# Patient Record
Sex: Female | Born: 1960 | Race: White | Hispanic: No | Marital: Married | Smoking: Former smoker
Health system: Southern US, Community
[De-identification: ages and names within clinical notes are randomized; demographics above are authoritative.]

## PROBLEM LIST (undated history)

## (undated) DIAGNOSIS — D649 Anemia, unspecified: Secondary | ICD-10-CM

## (undated) DIAGNOSIS — Z8719 Personal history of other diseases of the digestive system: Secondary | ICD-10-CM

## (undated) DIAGNOSIS — K56609 Unspecified intestinal obstruction, unspecified as to partial versus complete obstruction: Secondary | ICD-10-CM

## (undated) DIAGNOSIS — C801 Malignant (primary) neoplasm, unspecified: Secondary | ICD-10-CM

## (undated) DIAGNOSIS — K219 Gastro-esophageal reflux disease without esophagitis: Secondary | ICD-10-CM

## (undated) DIAGNOSIS — D219 Benign neoplasm of connective and other soft tissue, unspecified: Secondary | ICD-10-CM

## (undated) DIAGNOSIS — G2581 Restless legs syndrome: Secondary | ICD-10-CM

## (undated) DIAGNOSIS — E785 Hyperlipidemia, unspecified: Secondary | ICD-10-CM

## (undated) HISTORY — PX: EYE SURGERY: SHX253

## (undated) HISTORY — DX: Hyperlipidemia, unspecified: E78.5

## (undated) HISTORY — DX: Gastro-esophageal reflux disease without esophagitis: K21.9

## (undated) HISTORY — PX: SMALL INTESTINE SURGERY: SHX150

## (undated) HISTORY — PX: EXPLORATORY LAPAROTOMY: SUR591

## (undated) HISTORY — DX: Benign neoplasm of connective and other soft tissue, unspecified: D21.9

## (undated) HISTORY — PX: ABDOMINAL HYSTERECTOMY: SHX81

## (undated) HISTORY — PX: DIAGNOSTIC LAPAROSCOPY: SUR761

## (undated) HISTORY — DX: Personal history of other diseases of the digestive system: Z87.19

---

## 1985-09-12 DIAGNOSIS — N809 Endometriosis, unspecified: Secondary | ICD-10-CM | POA: Insufficient documentation

## 2014-06-27 LAB — LIPID PANEL
HDL: 84 — AB (ref 35–70)
LDL CALC: 127
Triglycerides: 40 (ref 40–160)

## 2014-08-28 LAB — HM COLONOSCOPY

## 2014-08-28 LAB — COLOGUARD: COLOGUARD: NEGATIVE

## 2015-09-13 HISTORY — PX: ROTATOR CUFF REPAIR: SHX139

## 2016-06-09 LAB — HM COLONOSCOPY

## 2016-06-13 LAB — HM MAMMOGRAPHY

## 2016-06-15 LAB — FERRITIN: Ferritin: 17.5

## 2017-01-20 DIAGNOSIS — Z23 Encounter for immunization: Secondary | ICD-10-CM | POA: Diagnosis not present

## 2017-03-01 ENCOUNTER — Encounter: Payer: Self-pay | Admitting: Family Medicine

## 2017-03-01 ENCOUNTER — Ambulatory Visit (INDEPENDENT_AMBULATORY_CARE_PROVIDER_SITE_OTHER): Payer: 59 | Admitting: Family Medicine

## 2017-03-01 VITALS — BP 118/66 | HR 81 | Temp 98.5°F | Wt 131.0 lb

## 2017-03-01 DIAGNOSIS — Z7689 Persons encountering health services in other specified circumstances: Secondary | ICD-10-CM

## 2017-03-01 DIAGNOSIS — H9193 Unspecified hearing loss, bilateral: Secondary | ICD-10-CM | POA: Diagnosis not present

## 2017-03-01 DIAGNOSIS — J069 Acute upper respiratory infection, unspecified: Secondary | ICD-10-CM | POA: Diagnosis not present

## 2017-03-01 NOTE — Progress Notes (Signed)
Subjective:    Patient ID: Jennifer [name], female    DOB: [birth year], 56 y.o.   MRN: [id number]  HPI This is a 56 yo female, employed by [employer] who presents today to establish care and with sinus problems x 1-2 weeks. Started as cold, now having pressure under eyes. Some post nasal drainage and cough. Currently with thin nasal secretions. Some cough, relieved with mucinex dm. Symptoms a little improved today. Has noticed it is more difficult to hear when she is taking blood pressure readings. This is new with current symptoms.   Had total hysterectomy at young age due to fibroids. Uses estriol/testosterone drops to wrist daily. Takes progesterone 100 mg daily for restless legs.   Moved here from [place], [place]. Married with three adopted children. Enjoys exercise, travel.   Past Medical History:  Diagnosis Date  . GERD (gastroesophageal reflux disease)   . Hyperlipidemia    Past Surgical History:  Procedure Laterality Date  . ABDOMINAL HYSTERECTOMY    . ROTATOR CUFF REPAIR Right 2017   Family History  Problem Relation Age of Onset  . Cancer Mother   . Early death Mother   . Diabetes Father   . Hyperlipidemia Father   . Heart disease Father   . Cancer Maternal Grandmother   . Cancer Paternal Grandmother    Social History  Substance Use Topics  . Smoking status: Never Smoker  . Smokeless tobacco: Never Used  . Alcohol use 1.2 oz/week    2 Glasses of wine per week      Review of Systems  Constitutional: Negative for fatigue, fever and unexpected weight change.  HENT: Positive for postnasal drip, rhinorrhea and sinus pressure. Negative for sore throat.   Respiratory: Positive for cough. Negative for shortness of breath and wheezing.   Cardiovascular: Negative for chest pain, palpitations and leg swelling.  Neurological: Negative for headaches.       Objective:   Physical Exam  Constitutional: She is oriented to person, place, and time. She appears well-developed and  well-nourished. No distress.  HENT:  Head: Normocephalic and atraumatic.  Right Ear: External ear normal.  Left Ear: External ear normal.  Nose: Mucosal edema, rhinorrhea and septal deviation present. Right sinus exhibits no maxillary sinus tenderness and no frontal sinus tenderness. Left sinus exhibits no maxillary sinus tenderness and no frontal sinus tenderness.  Mouth/Throat: Uvula is midline. Posterior oropharyngeal erythema (mild with post nasal drainage.) present.  Hearing grossly normal. Weber- no lateralization, Rhine- AC>BC. TMs dull.   Cardiovascular: Normal rate, regular rhythm and normal heart sounds.   Pulmonary/Chest: Effort normal and breath sounds normal.  Neurological: She is alert and oriented to person, place, and time.  Skin: Skin is warm and dry. She is not diaphoretic.  Psychiatric: She has a normal mood and affect. Her behavior is normal. Judgment and thought content normal.  Vitals reviewed.     BP 118/66 (BP Location: Right Arm, Patient Position: Sitting, Cuff Size: Normal)   Pulse 81   Temp 98.5 F (36.9 C) (Oral)   Wt 131 lb (59.4 kg)   SpO2 97%  Wt Readings from Last 3 Encounters:  03/01/17 131 lb (59.4 kg)      Assessment & Plan:  1. Encounter to establish care - will obtain records from prior PCP, she will send me her labs via Mychart  2. Viral URI - feeling better today, discussed symptomatic treatment and written and verbal instructions provided  3. Decreased hearing of both ears - gross  hearing intact - likely due to fluid related to #2 - will try Afrin and Sudafed - if no improvement or if worsening symptoms, will refer to ENT   Clarene Reamer, FNP-BC  [facility] [place], [place]  03/04/2017 7:08 AM

## 2017-03-01 NOTE — Patient Instructions (Addendum)
Sudafed 1 tablet am/after lunch  Afrin for up to 4 days  If hearing not better in 1 week let me know

## 2017-03-04 DIAGNOSIS — K219 Gastro-esophageal reflux disease without esophagitis: Secondary | ICD-10-CM | POA: Insufficient documentation

## 2017-03-06 ENCOUNTER — Encounter: Payer: Self-pay | Admitting: Family Medicine

## 2017-03-07 ENCOUNTER — Encounter: Payer: Self-pay | Admitting: Family Medicine

## 2017-03-08 ENCOUNTER — Encounter: Payer: Self-pay | Admitting: Family Medicine

## 2017-03-09 MED FILL — PROGESTERONE 100 MG CAPSULE: 100 | 90 days supply | Qty: 90 | Fill #0

## 2017-03-14 ENCOUNTER — Encounter: Payer: Self-pay | Admitting: Family Medicine

## 2017-03-16 ENCOUNTER — Encounter: Payer: Self-pay | Admitting: Family Medicine

## 2017-04-10 MED FILL — raNITIdine HCL 150 MG TABS: 150 | 90 days supply | Qty: 180 | Fill #0

## 2017-04-28 ENCOUNTER — Encounter: Payer: Self-pay | Admitting: Family Medicine

## 2017-05-05 ENCOUNTER — Other Ambulatory Visit: Payer: Self-pay | Admitting: Family Medicine

## 2017-05-05 DIAGNOSIS — Z1231 Encounter for screening mammogram for malignant neoplasm of breast: Secondary | ICD-10-CM

## 2017-05-14 ENCOUNTER — Encounter (HOSPITAL_COMMUNITY): Payer: Self-pay | Admitting: *Deleted

## 2017-05-14 ENCOUNTER — Emergency Department (HOSPITAL_COMMUNITY): Payer: 59

## 2017-05-14 ENCOUNTER — Inpatient Hospital Stay (HOSPITAL_COMMUNITY)
Admission: EM | Admit: 2017-05-14 | Discharge: 2017-05-17 | DRG: 390 | Disposition: A | Discharge status: Home / Self Care | Payer: 59 | Attending: General Surgery | Admitting: General Surgery

## 2017-05-14 DIAGNOSIS — Z885 Allergy status to narcotic agent status: Secondary | ICD-10-CM | POA: Diagnosis not present

## 2017-05-14 DIAGNOSIS — K565 Intestinal adhesions [bands], unspecified as to partial versus complete obstruction: Secondary | ICD-10-CM | POA: Diagnosis present

## 2017-05-14 DIAGNOSIS — K219 Gastro-esophageal reflux disease without esophagitis: Secondary | ICD-10-CM | POA: Diagnosis present

## 2017-05-14 DIAGNOSIS — K56609 Unspecified intestinal obstruction, unspecified as to partial versus complete obstruction: Secondary | ICD-10-CM | POA: Diagnosis present

## 2017-05-14 DIAGNOSIS — E860 Dehydration: Secondary | ICD-10-CM | POA: Diagnosis present

## 2017-05-14 DIAGNOSIS — N809 Endometriosis, unspecified: Secondary | ICD-10-CM | POA: Diagnosis present

## 2017-05-14 DIAGNOSIS — R112 Nausea with vomiting, unspecified: Secondary | ICD-10-CM | POA: Diagnosis not present

## 2017-05-14 DIAGNOSIS — Z79890 Hormone replacement therapy (postmenopausal): Secondary | ICD-10-CM | POA: Diagnosis not present

## 2017-05-14 DIAGNOSIS — R1110 Vomiting, unspecified: Secondary | ICD-10-CM | POA: Diagnosis not present

## 2017-05-14 DIAGNOSIS — N281 Cyst of kidney, acquired: Secondary | ICD-10-CM | POA: Diagnosis not present

## 2017-05-14 DIAGNOSIS — Z4682 Encounter for fitting and adjustment of non-vascular catheter: Secondary | ICD-10-CM | POA: Diagnosis not present

## 2017-05-14 DIAGNOSIS — E876 Hypokalemia: Secondary | ICD-10-CM | POA: Diagnosis not present

## 2017-05-14 DIAGNOSIS — Z90710 Acquired absence of both cervix and uterus: Secondary | ICD-10-CM | POA: Diagnosis not present

## 2017-05-14 DIAGNOSIS — K56600 Partial intestinal obstruction, unspecified as to cause: Secondary | ICD-10-CM | POA: Diagnosis not present

## 2017-05-14 DIAGNOSIS — R109 Unspecified abdominal pain: Secondary | ICD-10-CM | POA: Diagnosis not present

## 2017-05-14 DIAGNOSIS — R739 Hyperglycemia, unspecified: Secondary | ICD-10-CM | POA: Diagnosis present

## 2017-05-14 DIAGNOSIS — Z79899 Other long term (current) drug therapy: Secondary | ICD-10-CM

## 2017-05-14 DIAGNOSIS — Z0189 Encounter for other specified special examinations: Secondary | ICD-10-CM

## 2017-05-14 HISTORY — DX: Unspecified intestinal obstruction, unspecified as to partial versus complete obstruction: K56.609

## 2017-05-14 LAB — CBC
HEMATOCRIT: 48.4 % — AB (ref 36.0–46.0)
Hemoglobin: 17.1 g/dL — ABNORMAL HIGH (ref 12.0–15.0)
MCH: 30.2 pg (ref 26.0–34.0)
MCHC: 35.3 g/dL (ref 30.0–36.0)
MCV: 85.4 fL (ref 78.0–100.0)
PLATELETS: 349 10*3/uL (ref 150–400)
RBC: 5.67 MIL/uL — AB (ref 3.87–5.11)
RDW: 13.2 % (ref 11.5–15.5)
WBC: 19.8 10*3/uL — ABNORMAL HIGH (ref 4.0–10.5)

## 2017-05-14 LAB — COMPREHENSIVE METABOLIC PANEL
ALT: 16 U/L (ref 14–54)
AST: 25 U/L (ref 15–41)
Albumin: 5.1 g/dL — ABNORMAL HIGH (ref 3.5–5.0)
Alkaline Phosphatase: 63 U/L (ref 38–126)
Anion gap: 19 — ABNORMAL HIGH (ref 5–15)
BUN: 22 mg/dL — ABNORMAL HIGH (ref 6–20)
CHLORIDE: 96 mmol/L — AB (ref 101–111)
CO2: 25 mmol/L (ref 22–32)
CREATININE: 1.16 mg/dL — AB (ref 0.44–1.00)
Calcium: 10.7 mg/dL — ABNORMAL HIGH (ref 8.9–10.3)
GFR calc non Af Amer: 53 mL/min — ABNORMAL LOW (ref >60)
Glucose, Bld: 160 mg/dL — ABNORMAL HIGH (ref 65–99)
Potassium: 3.3 mmol/L — ABNORMAL LOW (ref 3.5–5.1)
SODIUM: 140 mmol/L (ref 135–145)
Total Bilirubin: 1.1 mg/dL (ref 0.3–1.2)
Total Protein: 8.6 g/dL — ABNORMAL HIGH (ref 6.5–8.1)

## 2017-05-14 LAB — LIPASE, BLOOD: Lipase: 26 U/L (ref 11–51)

## 2017-05-14 MED ORDER — FENTANYL CITRATE (PF) 100 MCG/2ML IJ SOLN
25.0000 ug | INTRAMUSCULAR | Status: DC | PRN
  Administered 2017-05-14 – 2017-05-15 (×7): 50 ug via INTRAVENOUS
  Filled 2017-05-14 (×7): qty 2

## 2017-05-14 MED ORDER — HYDROMORPHONE HCL 1 MG/ML IJ SOLN
0.5000 mg | Freq: Once | INTRAMUSCULAR | Status: AC
  Administered 2017-05-14: 0.5 mg via INTRAVENOUS
  Filled 2017-05-14: qty 1

## 2017-05-14 MED ORDER — PHENOL 1.4 % MT LIQD
1.0000 | OROMUCOSAL | Status: DC | PRN
  Filled 2017-05-14: qty 177

## 2017-05-14 MED ORDER — IOPAMIDOL (ISOVUE-300) INJECTION 61%
100.0000 mL | Freq: Once | INTRAVENOUS | Status: AC | PRN
  Administered 2017-05-14: 100 mL via INTRAVENOUS

## 2017-05-14 MED ORDER — ONDANSETRON HCL 4 MG/2ML IJ SOLN
4.0000 mg | Freq: Once | INTRAMUSCULAR | Status: AC
  Administered 2017-05-14: 4 mg via INTRAVENOUS
  Filled 2017-05-14: qty 2

## 2017-05-14 MED ORDER — SODIUM CHLORIDE 0.9 % IV BOLUS (SEPSIS)
1000.0000 mL | Freq: Once | INTRAVENOUS | Status: AC
  Administered 2017-05-14: 1000 mL via INTRAVENOUS

## 2017-05-14 MED ORDER — POTASSIUM CHLORIDE 10 MEQ/100ML IV SOLN
10.0000 meq | Freq: Once | INTRAVENOUS | Status: AC
  Administered 2017-05-14: 10 meq via INTRAVENOUS
  Filled 2017-05-14: qty 100

## 2017-05-14 MED ORDER — KCL IN DEXTROSE-NACL 20-5-0.9 MEQ/L-%-% IV SOLN
INTRAVENOUS | Status: DC
  Administered 2017-05-14 – 2017-05-15 (×3): via INTRAVENOUS
  Filled 2017-05-14 (×4): qty 1000

## 2017-05-14 MED ORDER — ONDANSETRON 4 MG PO TBDP: 4.0000 mg | ORAL_TABLET | Freq: Four times a day (QID) | ORAL | Status: DC | PRN

## 2017-05-14 MED ORDER — ONDANSETRON HCL 4 MG/2ML IJ SOLN
4.0000 mg | Freq: Four times a day (QID) | INTRAMUSCULAR | Status: DC | PRN
  Administered 2017-05-14 – 2017-05-15 (×2): 4 mg via INTRAVENOUS
  Filled 2017-05-14 (×2): qty 2

## 2017-05-14 MED ORDER — SODIUM CHLORIDE 0.9 % IV SOLN
INTRAVENOUS | Status: DC
  Administered 2017-05-14: 13:00:00 via INTRAVENOUS

## 2017-05-14 MED ORDER — PANTOPRAZOLE SODIUM 40 MG IV SOLR
40.0000 mg | Freq: Every day | INTRAVENOUS | Status: DC
  Administered 2017-05-14 – 2017-05-15 (×2): 40 mg via INTRAVENOUS
  Filled 2017-05-14 (×3): qty 40

## 2017-05-14 MED ORDER — IOPAMIDOL (ISOVUE-300) INJECTION 61%
INTRAVENOUS | Status: AC
  Filled 2017-05-14: qty 100

## 2017-05-14 MED ORDER — HYDROMORPHONE HCL 1 MG/ML IJ SOLN
1.0000 mg | Freq: Once | INTRAMUSCULAR | Status: AC
  Administered 2017-05-14: 1 mg via INTRAVENOUS
  Filled 2017-05-14: qty 1

## 2017-05-14 MED ORDER — HEPARIN SODIUM (PORCINE) 5000 UNIT/ML IJ SOLN
5000.0000 [IU] | Freq: Three times a day (TID) | INTRAMUSCULAR | Status: DC
  Administered 2017-05-14 – 2017-05-17 (×7): 5000 [IU] via SUBCUTANEOUS
  Filled 2017-05-14 (×6): qty 1

## 2017-05-14 NOTE — ED Notes (Signed)
Bed: WA25 Expected date:  Expected time:  Means of arrival:  Comments: 

## 2017-05-14 NOTE — ED Notes (Addendum)
Pt is alert and oriented x 4 and is verbally responsive. Pt is grimace in pain with 10/10 pain and is guarding,  in the  Epigastric region x 2 days with 5 episodes of vomiting I  The past 24 hours with green emesis. Pt spouse is at bedside.Marland Kitchen

## 2017-05-14 NOTE — H&P (Signed)
Jennifer [name] is an 56 y.o. female.   Chief Complaint: abdominal pain HPI:  The patient is a 56 year old white female who presents to the  Emergency department with a 1 day history of abdominal pain. The pain has been associated with nausea and vomiting. She has also felt bloated. She has a history of previous bowel obstruction that required exploration in about 4-5 years ago. Her previous surgeries have been done in [place]. She had a CT scan that showed a small bowel obstruction with a possible transition zone likely in the right lower quadrant.  Past Medical History:  Diagnosis Date  . Bowel obstruction ([place])   . GERD (gastroesophageal reflux disease)   . Hyperlipidemia     Past Surgical History:  Procedure Laterality Date  . ABDOMINAL HYSTERECTOMY    . ROTATOR CUFF REPAIR Right 2017    Family History  Problem Relation Age of Onset  . Cancer Mother   . Early death Mother   . Diabetes Father   . Hyperlipidemia Father   . Heart disease Father   . Cancer Maternal Grandmother   . Cancer Paternal Grandmother    Social History:  reports that she has never smoked. She has never used smokeless tobacco. She reports that she drinks about 1.2 oz of alcohol per week . Her drug history is not on file.  Allergies:  Allergies  Allergen Reactions  . Codeine Nausea And Vomiting     (Not in a hospital admission)  Results for orders placed or performed during the hospital encounter of 05/14/17 (from the past 48 hour(s))  Lipase, blood     Status: None   Collection Time: 05/14/17 10:42 AM  Result Value Ref Range   Lipase 26 11 - 51 U/L  Comprehensive metabolic panel     Status: Abnormal   Collection Time: 05/14/17 10:42 AM  Result Value Ref Range   Sodium 140 135 - 145 mmol/L   Potassium 3.3 (L) 3.5 - 5.1 mmol/L   Chloride 96 (L) 101 - 111 mmol/L   CO2 25 22 - 32 mmol/L   Glucose, Bld 160 (H) 65 - 99 mg/dL   BUN 22 (H) 6 - 20 mg/dL   Creatinine, Ser 1.16 (H) 0.44 - 1.00 mg/dL   Calcium 10.7 (H) 8.9 - 10.3 mg/dL   Total Protein 8.6 (H) 6.5 - 8.1 g/dL   Albumin 5.1 (H) 3.5 - 5.0 g/dL   AST 25 15 - 41 U/L   ALT 16 14 - 54 U/L   Alkaline Phosphatase 63 38 - 126 U/L   Total Bilirubin 1.1 0.3 - 1.2 mg/dL   GFR calc non Af Amer 53 (L) >60 mL/min   GFR calc Af Amer >60 >60 mL/min    Comment: (NOTE) The eGFR has been calculated using the CKD EPI equation. This calculation has not been validated in all clinical situations. eGFR's persistently <60 mL/min signify possible Chronic Kidney Disease.    Anion gap 19 (H) 5 - 15  CBC     Status: Abnormal   Collection Time: 05/14/17 10:42 AM  Result Value Ref Range   WBC 19.8 (H) 4.0 - 10.5 K/uL   RBC 5.67 (H) 3.87 - 5.11 MIL/uL   Hemoglobin 17.1 (H) 12.0 - 15.0 g/dL   HCT 48.4 (H) 36.0 - 46.0 %   MCV 85.4 78.0 - 100.0 fL   MCH 30.2 26.0 - 34.0 pg   MCHC 35.3 30.0 - 36.0 g/dL   RDW 13.2 11.5 - 15.5 %  Platelets 349 150 - 400 K/uL   Ct Abdomen Pelvis W Contrast  Result Date: 05/14/2017 CLINICAL DATA:  Abdominal pain, nausea and vomiting. History of bowel obstruction. EXAM: CT ABDOMEN AND PELVIS WITH CONTRAST TECHNIQUE: Multidetector CT imaging of the abdomen and pelvis was performed using the standard protocol following bolus administration of intravenous contrast. CONTRAST:  126m ISOVUE-300 IOPAMIDOL (ISOVUE-300) INJECTION 61% COMPARISON:  None. FINDINGS: Lower chest: No significant pulmonary nodules or acute consolidative airspace disease. Hepatobiliary: Normal size liver. Subcentimeter hypodense peripheral right liver lobe lesion, too small to characterize, for which no further evaluation is required unless the patient has risk factors for liver malignancy. No additional liver lesions. Normal gallbladder with no radiopaque cholelithiasis. No biliary ductal dilatation. Common bile duct diameter 6 mm, top-normal. Pancreas: Normal, with no mass or duct dilation. Spleen: Normal size. No mass. Adrenals/Urinary Tract: Normal  adrenals. Hypodense 1.1 cm interpolar right renal cortical lesion. Otherwise normal kidneys, with no additional renal lesions and no hydronephrosis. Normal bladder. Stomach/Bowel: Mildly distended fluid-filled stomach. There is diffuse mild dilation of proximal and mid small bowel loops up to 3.7 cm diameter with associated air-fluid levels throughout the dilated small bowel loops right the distal small bowel is collapsed. Focal small bowel caliber transition is identified in the right lower quadrant (series 2/ image 62). No small bowel wall thickening, pneumatosis or discrete mass. Normal appendix. Collapsed normal large bowel with minimal stool. No large bowel wall thickening. Vascular/Lymphatic: Mildly atherosclerotic nonaneurysmal abdominal aorta. Patent portal, splenic, hepatic and renal veins. No pathologically enlarged lymph nodes in the abdomen or pelvis. Reproductive: Status post hysterectomy, with no abnormal findings at the vaginal cuff. No adnexal mass. Other: No pneumoperitoneum. No focal fluid collections. Small volume perihepatic and pelvic ascites. Musculoskeletal: No aggressive appearing focal osseous lesions. Mild thoracolumbar spondylosis. IMPRESSION: 1. Mid to distal mechanical small bowel obstruction with transition point identified in the right lower quadrant, probably due to adhesions, appearing moderate to high-grade. No bowel wall thickening. No perforation. 2. Small volume perihepatic and pelvic ascites. No focal fluid collections. 3. Indeterminate hypodense 1.1 cm interpolar right renal cortical lesion. Outpatient evaluation with MRI abdomen without with IV contrast recommended to exclude renal neoplasm. This recommendation follows ACR consensus guidelines: Management of the Incidental Renal Mass on CT: A White Paper of the ACR Incidental Findings Committee. J Am Coll Radiol 2018;15:264-273. 4.  Aortic Atherosclerosis (ICD10-I70.0). Electronically Signed   By: JIlona SorrelM.D.   On:  05/14/2017 13:26    Review of Systems  Constitutional: Negative.   HENT: Negative.   Eyes: Negative.   Respiratory: Negative.   Cardiovascular: Negative.   Gastrointestinal: Positive for abdominal pain, nausea and vomiting.  Genitourinary: Negative.   Musculoskeletal: Negative.   Skin: Negative.   Neurological: Negative.   Endo/Heme/Allergies: Negative.   Psychiatric/Behavioral: Negative.     Blood pressure 121/68, pulse 76, temperature 98.3 F (36.8 C), temperature source Oral, resp. rate 18, height 5' 4"  (1.626 m), weight 59 kg (130 lb), SpO2 97 %. Physical Exam  Constitutional: She is oriented to person, place, and time. She appears well-developed and well-nourished. No distress.  HENT:  Head: Normocephalic and atraumatic.  Mouth/Throat: No oropharyngeal exudate.  Eyes: Pupils are equal, round, and reactive to light. Conjunctivae and EOM are normal.  Neck: Normal range of motion. Neck supple.  Cardiovascular: Normal rate, regular rhythm and normal heart sounds.   Respiratory: Effort normal and breath sounds normal. No stridor. No respiratory distress.  GI: Soft. Bowel sounds are  normal.  There is some mild tenderness with mild distension. No peritonitis  Musculoskeletal: Normal range of motion. She exhibits no edema or tenderness.  Neurological: She is alert and oriented to person, place, and time. Coordination normal.  Skin: Skin is warm and dry. No erythema.  Psychiatric: She has a normal mood and affect. Her behavior is normal. Thought content normal.     Assessment/Plan The patient appears to have a small bowel obstruction that is most likely secondary to adhesions. At this point I would recommend admission to the hospital. We will plan to place an NG tube and rest her bowel. Once she has been decompressed we will start the small bowel protocol tomorrow. If she does not improve over the next few days then she may require exploration. She is in agreement with this  treatment plan.  Merrie Roof, MD 05/14/2017, 3:21 PM

## 2017-05-14 NOTE — ED Notes (Signed)
Requested post shift change for report.

## 2017-05-14 NOTE — ED Triage Notes (Signed)
Patient is alert and oriented x4.  She is being seen for possible bower obstruction.  Patient has a Hx of Bowel Obstructions.  Her last obstruction was in 2008.  Patient starting having epigastric pain with nausea and vomiting.  Currently she rates her pain 10 of 10.

## 2017-05-14 NOTE — ED Notes (Signed)
Called report  To 5W. RN shift change requested 20 minutes prior to bringing pt,

## 2017-05-14 NOTE — ED Provider Notes (Addendum)
[facility] DEPT Provider Note   CSN: [id number] Arrival date & time: 05/14/17  1001     History   Chief Complaint Chief Complaint  Patient presents with  . Abdominal Pain    HPI Jennifer [name] is a 56 y.o. female.comPlains of abdominal pain at epigastric area which has since spread to her entire abdomen onset 3 days ago accompanied by multiple episodes of vomiting and feeling of distention in her abdomen. She feels that symptoms are similar to those she suffered in the past from bowel obstruction. She is no longer passing gas parietal. She denies any fever. No treatment prior to coming here. Nothing makes symptoms better or worse. Pain is constant and severe  HPI  Past Medical History:  Diagnosis Date  . Bowel obstruction ([place])   . GERD (gastroesophageal reflux disease)   . Hyperlipidemia   Endometriosis  Patient Active Problem List   Diagnosis Date Noted  . GERD (gastroesophageal reflux disease) 03/04/2017    Past Surgical History:  Procedure Laterality Date  . ABDOMINAL HYSTERECTOMY    . ROTATOR CUFF REPAIR Right 2017   Multiple abdominal surgeries OB History    No data available       Home Medications    Prior to Admission medications   Medication Sig Start Date End Date Taking? Authorizing Provider  Cholecalciferol (VITAMIN D-3) 1000 units CAPS    Yes [provider]  ferrous sulfate 325 (65 FE) MG tablet    Yes [provider]  ibuprofen (ADVIL,MOTRIN) 200 MG tablet Take 400 mg by mouth every 6 (six) hours as needed for mild pain.   Yes [provider]  NONFORMULARY OR COMPOUNDED ITEM Apply 5 drops topically daily.   Yes [provider]  progesterone (PROMETRIUM) 100 MG capsule  02/13/17  Yes [provider]  ranitidine (ZANTAC) 150 MG tablet Take 150 mg by mouth at bedtime.  02/11/17  Yes [provider]    Family History Family History  Problem Relation Age of Onset  . Cancer Mother   . Early death  Mother   . Diabetes Father   . Hyperlipidemia Father   . Heart disease Father   . Cancer Maternal Grandmother   . Cancer Paternal Grandmother     Social History Social History  Substance Use Topics  . Smoking status: Never Smoker  . Smokeless tobacco: Never Used  . Alcohol use 1.2 oz/week    2 Glasses of wine per week   No illicit drug  Allergies   Codeine   Review of Systems Review of Systems  Constitutional: Negative.   HENT: Negative.   Respiratory: Negative.   Cardiovascular: Negative.   Gastrointestinal: Positive for abdominal distention, abdominal pain, nausea and vomiting.  Musculoskeletal: Negative.   Skin: Negative.   Neurological: Negative.   Psychiatric/Behavioral: Negative.   All other systems reviewed and are negative.    Physical Exam Updated Vital Signs BP 109/60 (BP Location: Left Arm)   Pulse 77   Temp 98.3 F (36.8 C) (Oral)   Resp 18   Ht 5\' 4"  (1.626 m)   Wt 59 kg (130 lb)   SpO2 95%   BMI 22.31 kg/m   Physical Exam  Constitutional: She appears well-developed and well-nourished.  HENT:  Head: Normocephalic and atraumatic.  Eyes: Pupils are equal, round, and reactive to light. Conjunctivae are normal.  Neck: Neck supple. No tracheal deviation present. No thyromegaly present.  Cardiovascular: Normal rate and regular rhythm.   No murmur heard. Pulmonary/Chest:  Effort normal and breath sounds normal.  Abdominal: Soft. She exhibits no distension. There is tenderness.  Diminished bowel sounds  Musculoskeletal: Normal range of motion. She exhibits no edema or tenderness.  Neurological: She is alert. Coordination normal.  Skin: Skin is warm and dry. No rash noted.  Psychiatric: She has a normal mood and affect.  Nursing note and vitals reviewed.  2:20 PM pain much improved after treatment with intravenous opioids and intravenous hydration. Nausea has resolved after treatment with intravenous Zofran. She is also received IV potassium  supplementation. I consulted Dr. Marlou Starks from general surgery who will come to evaluate patient  ED Treatments / Results  Labs (all labs ordered are listed, but only abnormal results are displayed) Labs Reviewed  COMPREHENSIVE METABOLIC PANEL - Abnormal; Notable for the following:       Result Value   Potassium 3.3 (*)    Chloride 96 (*)    Glucose, Bld 160 (*)    BUN 22 (*)    Creatinine, Ser 1.16 (*)    Calcium 10.7 (*)    Total Protein 8.6 (*)    Albumin 5.1 (*)    GFR calc non Af Amer 53 (*)    Anion gap 19 (*)    All other components within normal limits  CBC - Abnormal; Notable for the following:    WBC 19.8 (*)    RBC 5.67 (*)    Hemoglobin 17.1 (*)    HCT 48.4 (*)    All other components within normal limits  LIPASE, BLOOD    EKG  EKG Interpretation None       Radiology Ct Abdomen Pelvis W Contrast  Result Date: 05/14/2017 CLINICAL DATA:  Abdominal pain, nausea and vomiting. History of bowel obstruction. EXAM: CT ABDOMEN AND PELVIS WITH CONTRAST TECHNIQUE: Multidetector CT imaging of the abdomen and pelvis was performed using the standard protocol following bolus administration of intravenous contrast. CONTRAST:  126mL ISOVUE-300 IOPAMIDOL (ISOVUE-300) INJECTION 61% COMPARISON:  None. FINDINGS: Lower chest: No significant pulmonary nodules or acute consolidative airspace disease. Hepatobiliary: Normal size liver. Subcentimeter hypodense peripheral right liver lobe lesion, too small to characterize, for which no further evaluation is required unless the patient has risk factors for liver malignancy. No additional liver lesions. Normal gallbladder with no radiopaque cholelithiasis. No biliary ductal dilatation. Common bile duct diameter 6 mm, top-normal. Pancreas: Normal, with no mass or duct dilation. Spleen: Normal size. No mass. Adrenals/Urinary Tract: Normal adrenals. Hypodense 1.1 cm interpolar right renal cortical lesion. Otherwise normal kidneys, with no additional  renal lesions and no hydronephrosis. Normal bladder. Stomach/Bowel: Mildly distended fluid-filled stomach. There is diffuse mild dilation of proximal and mid small bowel loops up to 3.7 cm diameter with associated air-fluid levels throughout the dilated small bowel loops right the distal small bowel is collapsed. Focal small bowel caliber transition is identified in the right lower quadrant (series 2/ image 62). No small bowel wall thickening, pneumatosis or discrete mass. Normal appendix. Collapsed normal large bowel with minimal stool. No large bowel wall thickening. Vascular/Lymphatic: Mildly atherosclerotic nonaneurysmal abdominal aorta. Patent portal, splenic, hepatic and renal veins. No pathologically enlarged lymph nodes in the abdomen or pelvis. Reproductive: Status post hysterectomy, with no abnormal findings at the vaginal cuff. No adnexal mass. Other: No pneumoperitoneum. No focal fluid collections. Small volume perihepatic and pelvic ascites. Musculoskeletal: No aggressive appearing focal osseous lesions. Mild thoracolumbar spondylosis. IMPRESSION: 1. Mid to distal mechanical small bowel obstruction with transition point identified in the right lower quadrant,  probably due to adhesions, appearing moderate to high-grade. No bowel wall thickening. No perforation. 2. Small volume perihepatic and pelvic ascites. No focal fluid collections. 3. Indeterminate hypodense 1.1 cm interpolar right renal cortical lesion. Outpatient evaluation with MRI abdomen without with IV contrast recommended to exclude renal neoplasm. This recommendation follows ACR consensus guidelines: Management of the Incidental Renal Mass on CT: A White Paper of the ACR Incidental Findings Committee. J Am Coll Radiol 2018;15:264-273. 4.  Aortic Atherosclerosis (ICD10-I70.0). Electronically Signed   By: Ilona Sorrel M.D.   On: 05/14/2017 13:26    Procedures Procedures (including critical care time)  Medications Ordered in  ED Medications  0.9 %  sodium chloride infusion ( Intravenous New Bag/Given 05/14/17 1259)  iopamidol (ISOVUE-300) 61 % injection (not administered)  sodium chloride 0.9 % bolus 1,000 mL (1,000 mLs Intravenous New Bag/Given 05/14/17 1049)  sodium chloride 0.9 % bolus 1,000 mL (0 mLs Intravenous Stopped 05/14/17 1157)  potassium chloride 10 mEq in 100 mL IVPB (10 mEq Intravenous New Bag/Given 05/14/17 1156)  HYDROmorphone (DILAUDID) injection 1 mg (1 mg Intravenous Given 05/14/17 1157)  ondansetron (ZOFRAN) injection 4 mg (4 mg Intravenous Given 05/14/17 1157)  HYDROmorphone (DILAUDID) injection 0.5 mg (0.5 mg Intravenous Given 05/14/17 1234)  iopamidol (ISOVUE-300) 61 % injection 100 mL (100 mLs Intravenous Contrast Given 05/14/17 1240)     Initial Impression / Assessment and Plan / ED Course  I have reviewed the triage vital signs and the nursing notes.  Pertinent labs & imaging results that were available during my care of the patient were reviewed by me and considered in my medical decision making (see chart for details).       Final Clinical Impressions(s) / ED Diagnoses  Diagnosis #1 small bowel obstruction #2 hypokalemia #3 dehydration #4 hyperglycemia #5 renal cortical lesion Final diagnoses:  None    New Prescriptions New Prescriptions   No medications on file     Orlie Dakin, MD 05/14/17 1450 3:20 PM patient requesting additional pain medicine. Additional intravenous hydromorphone ordered by me   Orlie Dakin, MD 05/14/17 [phone]

## 2017-05-14 NOTE — ED Notes (Signed)
Placed NGT hook-up to intermittent suction. Green discharge is note

## 2017-05-15 ENCOUNTER — Inpatient Hospital Stay (HOSPITAL_COMMUNITY): Payer: 59

## 2017-05-15 LAB — BASIC METABOLIC PANEL
Anion gap: 6 (ref 5–15)
BUN: 18 mg/dL (ref 6–20)
CHLORIDE: 112 mmol/L — AB (ref 101–111)
CO2: 26 mmol/L (ref 22–32)
CREATININE: 0.79 mg/dL (ref 0.44–1.00)
Calcium: 7.9 mg/dL — ABNORMAL LOW (ref 8.9–10.3)
GFR calc Af Amer: >60 mL/min (ref >60)
GFR calc non Af Amer: >60 mL/min (ref >60)
GLUCOSE: 113 mg/dL — AB (ref 65–99)
Potassium: 3.7 mmol/L (ref 3.5–5.1)
Sodium: 144 mmol/L (ref 135–145)

## 2017-05-15 LAB — CBC
HCT: 35.9 % — ABNORMAL LOW (ref 36.0–46.0)
Hemoglobin: 12 g/dL (ref 12.0–15.0)
MCH: 29.1 pg (ref 26.0–34.0)
MCHC: 33.4 g/dL (ref 30.0–36.0)
MCV: 87.1 fL (ref 78.0–100.0)
PLATELETS: 251 10*3/uL (ref 150–400)
RBC: 4.12 MIL/uL (ref 3.87–5.11)
RDW: 13.7 % (ref 11.5–15.5)
WBC: 8.5 10*3/uL (ref 4.0–10.5)

## 2017-05-15 MED ORDER — KCL IN DEXTROSE-NACL 20-5-0.9 MEQ/L-%-% IV SOLN
INTRAVENOUS | Status: DC
  Administered 2017-05-15 – 2017-05-16 (×2): via INTRAVENOUS
  Filled 2017-05-15 (×2): qty 1000

## 2017-05-15 MED ORDER — SIMETHICONE 40 MG/0.6ML PO SUSP
40.0000 mg | Freq: Four times a day (QID) | ORAL | Status: DC | PRN
Start: 2017-05-15 — End: 2017-05-17
  Filled 2017-05-15: qty 0.6

## 2017-05-15 MED ORDER — ACETAMINOPHEN 500 MG PO TABS
1000.0000 mg | ORAL_TABLET | Freq: Four times a day (QID) | ORAL | Status: DC | PRN
  Administered 2017-05-15 – 2017-05-17 (×3): 1000 mg via ORAL
  Filled 2017-05-15 (×3): qty 2

## 2017-05-15 MED ORDER — METOCLOPRAMIDE HCL 5 MG PO TABS: 5.0000 mg | ORAL_TABLET | Freq: Four times a day (QID) | ORAL | Status: DC | PRN

## 2017-05-15 MED ORDER — BISACODYL 10 MG RE SUPP: 10.0000 mg | Freq: Two times a day (BID) | RECTAL | Status: DC | PRN

## 2017-05-15 MED ORDER — HYDROMORPHONE HCL-NACL 0.5-0.9 MG/ML-% IV SOSY: 0.5000 mg | PREFILLED_SYRINGE | INTRAVENOUS | Status: DC | PRN

## 2017-05-15 MED ORDER — VITAMINS A & D EX OINT
TOPICAL_OINTMENT | CUTANEOUS | Status: AC
  Filled 2017-05-15: qty 5

## 2017-05-15 MED ORDER — LIP MEDEX EX OINT
1.0000 "application " | TOPICAL_OINTMENT | Freq: Two times a day (BID) | CUTANEOUS | Status: DC
  Administered 2017-05-15: 1 via TOPICAL
  Filled 2017-05-15: qty 7

## 2017-05-15 MED ORDER — ALUM & MAG HYDROXIDE-SIMETH 200-200-20 MG/5ML PO SUSP: 30.0000 mL | Freq: Four times a day (QID) | ORAL | Status: DC | PRN

## 2017-05-15 MED ORDER — METHOCARBAMOL 1000 MG/10ML IJ SOLN
1000.0000 mg | Freq: Four times a day (QID) | INTRAMUSCULAR | Status: DC | PRN
  Filled 2017-05-15 (×2): qty 10

## 2017-05-15 MED ORDER — MAGIC MOUTHWASH
15.0000 mL | Freq: Four times a day (QID) | ORAL | Status: DC | PRN
  Filled 2017-05-15: qty 15

## 2017-05-15 MED ORDER — DIATRIZOATE MEGLUMINE & SODIUM 66-10 % PO SOLN
90.0000 mL | Freq: Once | ORAL | Status: AC
  Administered 2017-05-15: 90 mL via NASOGASTRIC
  Filled 2017-05-15: qty 90

## 2017-05-15 MED ORDER — MENTHOL 3 MG MT LOZG: 1.0000 | LOZENGE | OROMUCOSAL | Status: DC | PRN

## 2017-05-15 MED ORDER — DIPHENHYDRAMINE HCL 12.5 MG/5ML PO ELIX
12.5000 mg | ORAL_SOLUTION | Freq: Four times a day (QID) | ORAL | Status: DC | PRN
  Administered 2017-05-15: 12.5 mg via ORAL
  Filled 2017-05-15: qty 5

## 2017-05-15 MED ORDER — DIATRIZOATE MEGLUMINE & SODIUM 66-10 % PO SOLN: 90.0000 mL | Freq: Once | ORAL | Status: DC

## 2017-05-15 NOTE — Progress Notes (Signed)
Note: Portions of this report may have been transcribed using voice recognition software. Every effort was made to ensure accuracy; however, inadvertent computerized transcription errors may be present.   Any transcriptional errors that result from this process are unintentional.             Jennifer [name]  1961-01-23 [id number]  Patient Care Team: Elby Beck, FNP as PCP - General (Nurse Practitioner)  This patient is a 56 y.o.female   Patient c/o NGT hirting nares XRays with contrast in colon Pull NGT Start low vol clears  Patient Active Problem List   Diagnosis Date Noted  . SBO (small bowel obstruction) ([place]) 05/14/2017  . GERD (gastroesophageal reflux disease) 03/04/2017    Past Medical History:  Diagnosis Date  . Bowel obstruction ([place])   . GERD (gastroesophageal reflux disease)   . Hyperlipidemia     Past Surgical History:  Procedure Laterality Date  . ABDOMINAL HYSTERECTOMY    . ROTATOR CUFF REPAIR Right 2017    Social History   Social History  . Marital status: Married    Spouse name: N/A  . Number of children: N/A  . Years of education: N/A   Occupational History  . Not on file.   Social History Main Topics  . Smoking status: Never Smoker  . Smokeless tobacco: Never Used  . Alcohol use 1.2 oz/week    2 Glasses of wine per week  . Drug use: No  . Sexual activity: Yes    Partners: Male    Birth control/ protection: None   Other Topics Concern  . Not on file   Social History Narrative  . No narrative on file    Family History  Problem Relation Age of Onset  . Cancer Mother   . Early death Mother   . Diabetes Father   . Hyperlipidemia Father   . Heart disease Father   . Cancer Maternal Grandmother   . Cancer Paternal Grandmother     Current Facility-Administered Medications  Medication Dose Route Frequency Provider Last Rate Last Dose  . 0.9 %  sodium chloride infusion   Intravenous Continuous Orlie Dakin, MD 125 mL/hr  at 05/14/17 1259    . alum & mag hydroxide-simeth (MAALOX/MYLANTA) 200-200-20 MG/5ML suspension 30 mL  30 mL Oral Q6H PRN Michael Boston, MD      . bisacodyl (DULCOLAX) suppository 10 mg  10 mg Rectal Q12H PRN Michael Boston, MD      . dextrose 5 % and 0.9 % NaCl with KCl 20 mEq/L infusion   Intravenous Continuous Autumn Messing III, MD 100 mL/hr at 05/15/17 1817    . fentaNYL (SUBLIMAZE) injection 25-50 mcg  25-50 mcg Intravenous Q1H PRN Autumn Messing III, MD   50 mcg at 05/15/17 1943  . heparin injection 5,000 Units  5,000 Units Subcutaneous Q8H Autumn Messing III, MD   5,000 Units at 05/15/17 1603  . lip balm (CARMEX) ointment 1 application  1 application Topical BID Michael Boston, MD      . magic mouthwash  15 mL Oral QID PRN Michael Boston, MD      . menthol-cetylpyridinium (CEPACOL) lozenge 3 mg  1 lozenge Oral PRN Michael Boston, MD      . ondansetron (ZOFRAN-ODT) disintegrating tablet 4 mg  4 mg Oral Q6H PRN Autumn Messing III, MD       Or  . ondansetron [facility]) injection 4 mg  4 mg Intravenous Q6H PRN Jovita Kussmaul, MD   4 mg at  05/15/17 1110  . pantoprazole (PROTONIX) injection 40 mg  40 mg Intravenous QHS Autumn Messing III, MD   40 mg at 05/14/17 2115  . phenol (CHLORASEPTIC) mouth spray 1 spray  1 spray Mouth/Throat PRN Autumn Messing III, MD      . simethicone (MYLICON) 40 BW/4.6KZ suspension 40 mg  40 mg Oral QID PRN Michael Boston, MD         Allergies  Allergen Reactions  . Codeine Nausea And Vomiting    BP (!) 127/59 (BP Location: Left Arm)   Pulse 70   Temp 98.5 F (36.9 C) (Oral)   Resp 16   Ht 5\' 4"  (1.626 m)   Wt 59 kg (130 lb)   SpO2 100%   BMI 22.31 kg/m   Dg Abdomen 1 View  Result Date: 05/14/2017 CLINICAL DATA:  NG tube placed EXAM: ABDOMEN - 1 VIEW COMPARISON:  CT 05/14/2017 FINDINGS: NG tube with tip in the gastric body. Side port below the GE junction. Dilated loops of small bowel measuring 3.3 cm noted. Lung bases are clear. IMPRESSION: NG tube in good position. Electronically  Signed   By: Suzy Bouchard M.D.   On: 05/14/2017 17:17   Ct Abdomen Pelvis W Contrast  Result Date: 05/14/2017 CLINICAL DATA:  Abdominal pain, nausea and vomiting. History of bowel obstruction. EXAM: CT ABDOMEN AND PELVIS WITH CONTRAST TECHNIQUE: Multidetector CT imaging of the abdomen and pelvis was performed using the standard protocol following bolus administration of intravenous contrast. CONTRAST:  143mL ISOVUE-300 IOPAMIDOL (ISOVUE-300) INJECTION 61% COMPARISON:  None. FINDINGS: Lower chest: No significant pulmonary nodules or acute consolidative airspace disease. Hepatobiliary: Normal size liver. Subcentimeter hypodense peripheral right liver lobe lesion, too small to characterize, for which no further evaluation is required unless the patient has risk factors for liver malignancy. No additional liver lesions. Normal gallbladder with no radiopaque cholelithiasis. No biliary ductal dilatation. Common bile duct diameter 6 mm, top-normal. Pancreas: Normal, with no mass or duct dilation. Spleen: Normal size. No mass. Adrenals/Urinary Tract: Normal adrenals. Hypodense 1.1 cm interpolar right renal cortical lesion. Otherwise normal kidneys, with no additional renal lesions and no hydronephrosis. Normal bladder. Stomach/Bowel: Mildly distended fluid-filled stomach. There is diffuse mild dilation of proximal and mid small bowel loops up to 3.7 cm diameter with associated air-fluid levels throughout the dilated small bowel loops right the distal small bowel is collapsed. Focal small bowel caliber transition is identified in the right lower quadrant (series 2/ image 62). No small bowel wall thickening, pneumatosis or discrete mass. Normal appendix. Collapsed normal large bowel with minimal stool. No large bowel wall thickening. Vascular/Lymphatic: Mildly atherosclerotic nonaneurysmal abdominal aorta. Patent portal, splenic, hepatic and renal veins. No pathologically enlarged lymph nodes in the abdomen or pelvis.  Reproductive: Status post hysterectomy, with no abnormal findings at the vaginal cuff. No adnexal mass. Other: No pneumoperitoneum. No focal fluid collections. Small volume perihepatic and pelvic ascites. Musculoskeletal: No aggressive appearing focal osseous lesions. Mild thoracolumbar spondylosis. IMPRESSION: 1. Mid to distal mechanical small bowel obstruction with transition point identified in the right lower quadrant, probably due to adhesions, appearing moderate to high-grade. No bowel wall thickening. No perforation. 2. Small volume perihepatic and pelvic ascites. No focal fluid collections. 3. Indeterminate hypodense 1.1 cm interpolar right renal cortical lesion. Outpatient evaluation with MRI abdomen without with IV contrast recommended to exclude renal neoplasm. This recommendation follows ACR consensus guidelines: Management of the Incidental Renal Mass on CT: A White Paper of the ACR Incidental Findings Committee. J  Am Coll Radiol 2018;15:264-273. 4.  Aortic Atherosclerosis (ICD10-I70.0). Electronically Signed   By: Ilona Sorrel M.D.   On: 05/14/2017 13:26   Dg Abd Portable 1v-small Bowel Obstruction Protocol-initial, 8 Hr Delay  Result Date: 05/15/2017 CLINICAL DATA:  Small bowel obstruction, improved on radiographs earlier today. EXAM: PORTABLE ABDOMEN - 1 VIEW COMPARISON:  Earlier today. FINDINGS: The current image was obtained 8 hours after administration of Gastrografin through the patient's nasogastric tube. This demonstrates contrast throughout normal caliber colon. There is a single mildly dilated loop of small bowel in the left mid to lower abdomen. No retained contrast is seen in the small bowel. The nasogastric tube tip is in the proximal stomach and side hole in the region of the gastroesophageal junction. Unremarkable bones. IMPRESSION: 1. Normal passage of contrast through the small bowel and into the colon. 2. Single mildly dilated loop of small bowel in the left mid to lower abdomen.  This could be due to focal ileus or minimal partial obstruction. Electronically Signed   By: Claudie Revering M.D.   On: 05/15/2017 19:37   Dg Abd Portable 2v  Result Date: 05/15/2017 CLINICAL DATA:  Small bowel obstruction EXAM: PORTABLE ABDOMEN - 2 VIEW COMPARISON:  05/14/2017 FINDINGS: Nasogastric tube extends into the stomach. Reduced degree of small bowel dilatation. There continues to be a lesser degree of dilatation and stacking of small bowel loops. Air is present throughout the colon to the rectum. Left lateral decubitus view is negative for free air. IMPRESSION: Persistent mildly dilated small bowel, improved, with a lesser degree of dilatation compared to 05/14/2017. No free air. Electronically Signed   By: Andreas Newport M.D.   On: 05/15/2017 06:49

## 2017-05-15 NOTE — Progress Notes (Signed)
Subjective/Chief Complaint: She feels better. Has started passing flatus   Objective: Vital signs in last 24 hours: Temp:  [97.6 F (36.4 C)-98.8 F (37.1 C)] 98.7 F (37.1 C) (09/03 0537) Pulse Rate:  [75-104] 77 (09/03 0537) Resp:  [18-20] 18 (09/03 0537) BP: (109-137)/(58-83) 121/58 (09/03 0537) SpO2:  [95 %-100 %] 100 % (09/03 0537) Weight:  [59 kg (130 lb)] 59 kg (130 lb) (09/02 1011)    Intake/Output from previous day: 09/02 0701 - 09/03 0700 In: 3006.7 [I.V.:906.7; IV Piggyback:2100] Out: 570 [Emesis/NG output:270; Stool:300] Intake/Output this shift: No intake/output data recorded.  General appearance: alert and cooperative Resp: clear to auscultation bilaterally Cardio: regular rate and rhythm GI: soft, nontender. not distended  Lab Results:   Recent Labs  05/14/17 1042 05/15/17 0343  WBC 19.8* 8.5  HGB 17.1* 12.0  HCT 48.4* 35.9*  PLT 349 251   BMET  Recent Labs  05/14/17 1042 05/15/17 0343  NA 140 144  K 3.3* 3.7  CL 96* 112*  CO2 25 26  GLUCOSE 160* 113*  BUN 22* 18  CREATININE 1.16* 0.79  CALCIUM 10.7* 7.9*   PT/INR No results for input(s): LABPROT, INR in the last 72 hours. ABG No results for input(s): PHART, HCO3 in the last 72 hours.  Invalid input(s): PCO2, PO2  Studies/Results: Dg Abdomen 1 View  Result Date: 05/14/2017 CLINICAL DATA:  NG tube placed EXAM: ABDOMEN - 1 VIEW COMPARISON:  CT 05/14/2017 FINDINGS: NG tube with tip in the gastric body. Side port below the GE junction. Dilated loops of small bowel measuring 3.3 cm noted. Lung bases are clear. IMPRESSION: NG tube in good position. Electronically Signed   By: Suzy Bouchard M.D.   On: 05/14/2017 17:17   Ct Abdomen Pelvis W Contrast  Result Date: 05/14/2017 CLINICAL DATA:  Abdominal pain, nausea and vomiting. History of bowel obstruction. EXAM: CT ABDOMEN AND PELVIS WITH CONTRAST TECHNIQUE: Multidetector CT imaging of the abdomen and pelvis was performed using the  standard protocol following bolus administration of intravenous contrast. CONTRAST:  165mL ISOVUE-300 IOPAMIDOL (ISOVUE-300) INJECTION 61% COMPARISON:  None. FINDINGS: Lower chest: No significant pulmonary nodules or acute consolidative airspace disease. Hepatobiliary: Normal size liver. Subcentimeter hypodense peripheral right liver lobe lesion, too small to characterize, for which no further evaluation is required unless the patient has risk factors for liver malignancy. No additional liver lesions. Normal gallbladder with no radiopaque cholelithiasis. No biliary ductal dilatation. Common bile duct diameter 6 mm, top-normal. Pancreas: Normal, with no mass or duct dilation. Spleen: Normal size. No mass. Adrenals/Urinary Tract: Normal adrenals. Hypodense 1.1 cm interpolar right renal cortical lesion. Otherwise normal kidneys, with no additional renal lesions and no hydronephrosis. Normal bladder. Stomach/Bowel: Mildly distended fluid-filled stomach. There is diffuse mild dilation of proximal and mid small bowel loops up to 3.7 cm diameter with associated air-fluid levels throughout the dilated small bowel loops right the distal small bowel is collapsed. Focal small bowel caliber transition is identified in the right lower quadrant (series 2/ image 62). No small bowel wall thickening, pneumatosis or discrete mass. Normal appendix. Collapsed normal large bowel with minimal stool. No large bowel wall thickening. Vascular/Lymphatic: Mildly atherosclerotic nonaneurysmal abdominal aorta. Patent portal, splenic, hepatic and renal veins. No pathologically enlarged lymph nodes in the abdomen or pelvis. Reproductive: Status post hysterectomy, with no abnormal findings at the vaginal cuff. No adnexal mass. Other: No pneumoperitoneum. No focal fluid collections. Small volume perihepatic and pelvic ascites. Musculoskeletal: No aggressive appearing focal osseous lesions. Mild  thoracolumbar spondylosis. IMPRESSION: 1. Mid to  distal mechanical small bowel obstruction with transition point identified in the right lower quadrant, probably due to adhesions, appearing moderate to high-grade. No bowel wall thickening. No perforation. 2. Small volume perihepatic and pelvic ascites. No focal fluid collections. 3. Indeterminate hypodense 1.1 cm interpolar right renal cortical lesion. Outpatient evaluation with MRI abdomen without with IV contrast recommended to exclude renal neoplasm. This recommendation follows ACR consensus guidelines: Management of the Incidental Renal Mass on CT: A White Paper of the ACR Incidental Findings Committee. J Am Coll Radiol 2018;15:264-273. 4.  Aortic Atherosclerosis (ICD10-I70.0). Electronically Signed   By: Ilona Sorrel M.D.   On: 05/14/2017 13:26   Dg Abd Portable 2v  Result Date: 05/15/2017 CLINICAL DATA:  Small bowel obstruction EXAM: PORTABLE ABDOMEN - 2 VIEW COMPARISON:  05/14/2017 FINDINGS: Nasogastric tube extends into the stomach. Reduced degree of small bowel dilatation. There continues to be a lesser degree of dilatation and stacking of small bowel loops. Air is present throughout the colon to the rectum. Left lateral decubitus view is negative for free air. IMPRESSION: Persistent mildly dilated small bowel, improved, with a lesser degree of dilatation compared to 05/14/2017. No free air. Electronically Signed   By: Andreas Newport M.D.   On: 05/15/2017 06:49    Anti-infectives: Anti-infectives    None      Assessment/Plan: s/p * No surgery found * will start small bowel protocol this am.  If contrast passes through then consider clamping ng later today Ambulate sbo improving  LOS: 1 day    TOTH III,Raza Bayless S 05/15/2017

## 2017-05-16 ENCOUNTER — Inpatient Hospital Stay (HOSPITAL_COMMUNITY): Payer: 59

## 2017-05-16 LAB — HIV ANTIBODY (ROUTINE TESTING W REFLEX): HIV SCREEN 4TH GENERATION: NONREACTIVE

## 2017-05-16 MED ORDER — OXYCODONE-ACETAMINOPHEN 5-325 MG PO TABS: 1.0000 | ORAL_TABLET | ORAL | Status: DC | PRN

## 2017-05-16 MED ORDER — METHOCARBAMOL 500 MG PO TABS
500.0000 mg | ORAL_TABLET | Freq: Three times a day (TID) | ORAL | Status: DC | PRN
  Administered 2017-05-16: 500 mg via ORAL
  Filled 2017-05-16: qty 1

## 2017-05-16 MED ORDER — HYDROMORPHONE HCL-NACL 0.5-0.9 MG/ML-% IV SOSY: 0.5000 mg | PREFILLED_SYRINGE | INTRAVENOUS | Status: DC | PRN

## 2017-05-16 NOTE — Progress Notes (Signed)
Patient ID: Jennifer [name], female   DOB: 05-04-1961, 56 y.o.   MRN: [id number]  [facility] Surgery Progress Note     Subjective: CC- SBO Patient states that she is feeling great this morning. Abdomen a little sore but pain resolved. She is tolerating clear liquids. Passing flatus and having few loose BM's. Has ambulated several laps this AM.  Objective: Vital signs in last 24 hours: Temp:  [98.1 F (36.7 C)-98.5 F (36.9 C)] 98.4 F (36.9 C) (09/04 0624) Pulse Rate:  [64-71] 64 (09/04 0624) Resp:  [16-18] 18 (09/04 0624) BP: (119-131)/(59-70) 119/63 (09/04 0624) SpO2:  [98 %-100 %] 98 % (09/04 0624) Last BM Date: 05/16/17  Intake/Output from previous day: 09/03 0701 - 09/04 0700 In: 3010.8 [I.V.:3010.8] Out: 2300 [Urine:1850; Emesis/NG output:450] Intake/Output this shift: No intake/output data recorded.  PE: Gen:  Alert, NAD, pleasant HEENT: EOM's intact, pupils equal and round Card:  RRR, no M/G/R heard Pulm:  CTAB, no W/R/R, effort normal Abd: Soft, ND, mild lower abdominal tenderness, +BS, no HSM, no hernia, well healed midline incision Ext:  No erythema, edema, or tenderness BUE/BLE  Psych: A&Ox3  Skin: no rashes noted, warm and dry  Lab Results:   Recent Labs  05/14/17 1042 05/15/17 0343  WBC 19.8* 8.5  HGB 17.1* 12.0  HCT 48.4* 35.9*  PLT 349 251   BMET  Recent Labs  05/14/17 1042 05/15/17 0343  NA 140 144  K 3.3* 3.7  CL 96* 112*  CO2 25 26  GLUCOSE 160* 113*  BUN 22* 18  CREATININE 1.16* 0.79  CALCIUM 10.7* 7.9*   PT/INR No results for input(s): LABPROT, INR in the last 72 hours. CMP     Component Value Date/Time   NA 144 05/15/2017 0343   K 3.7 05/15/2017 0343   CL 112 (H) 05/15/2017 0343   CO2 26 05/15/2017 0343   GLUCOSE 113 (H) 05/15/2017 0343   BUN 18 05/15/2017 0343   CREATININE 0.79 05/15/2017 0343   CALCIUM 7.9 (L) 05/15/2017 0343   PROT 8.6 (H) 05/14/2017 1042   ALBUMIN 5.1 (H) 05/14/2017 1042   AST 25 05/14/2017  1042   ALT 16 05/14/2017 1042   ALKPHOS 63 05/14/2017 1042   BILITOT 1.1 05/14/2017 1042   GFRNONAA >60 05/15/2017 0343   GFRAA >60 05/15/2017 0343   Lipase     Component Value Date/Time   LIPASE 26 05/14/2017 1042       Studies/Results: Dg Abdomen 1 View  Result Date: 05/14/2017 CLINICAL DATA:  NG tube placed EXAM: ABDOMEN - 1 VIEW COMPARISON:  CT 05/14/2017 FINDINGS: NG tube with tip in the gastric body. Side port below the GE junction. Dilated loops of small bowel measuring 3.3 cm noted. Lung bases are clear. IMPRESSION: NG tube in good position. Electronically Signed   By: Suzy Bouchard M.D.   On: 05/14/2017 17:17   Ct Abdomen Pelvis W Contrast  Result Date: 05/14/2017 CLINICAL DATA:  Abdominal pain, nausea and vomiting. History of bowel obstruction. EXAM: CT ABDOMEN AND PELVIS WITH CONTRAST TECHNIQUE: Multidetector CT imaging of the abdomen and pelvis was performed using the standard protocol following bolus administration of intravenous contrast. CONTRAST:  133mL ISOVUE-300 IOPAMIDOL (ISOVUE-300) INJECTION 61% COMPARISON:  None. FINDINGS: Lower chest: No significant pulmonary nodules or acute consolidative airspace disease. Hepatobiliary: Normal size liver. Subcentimeter hypodense peripheral right liver lobe lesion, too small to characterize, for which no further evaluation is required unless the patient has risk factors for liver malignancy. No additional  liver lesions. Normal gallbladder with no radiopaque cholelithiasis. No biliary ductal dilatation. Common bile duct diameter 6 mm, top-normal. Pancreas: Normal, with no mass or duct dilation. Spleen: Normal size. No mass. Adrenals/Urinary Tract: Normal adrenals. Hypodense 1.1 cm interpolar right renal cortical lesion. Otherwise normal kidneys, with no additional renal lesions and no hydronephrosis. Normal bladder. Stomach/Bowel: Mildly distended fluid-filled stomach. There is diffuse mild dilation of proximal and mid small bowel loops  up to 3.7 cm diameter with associated air-fluid levels throughout the dilated small bowel loops right the distal small bowel is collapsed. Focal small bowel caliber transition is identified in the right lower quadrant (series 2/ image 62). No small bowel wall thickening, pneumatosis or discrete mass. Normal appendix. Collapsed normal large bowel with minimal stool. No large bowel wall thickening. Vascular/Lymphatic: Mildly atherosclerotic nonaneurysmal abdominal aorta. Patent portal, splenic, hepatic and renal veins. No pathologically enlarged lymph nodes in the abdomen or pelvis. Reproductive: Status post hysterectomy, with no abnormal findings at the vaginal cuff. No adnexal mass. Other: No pneumoperitoneum. No focal fluid collections. Small volume perihepatic and pelvic ascites. Musculoskeletal: No aggressive appearing focal osseous lesions. Mild thoracolumbar spondylosis. IMPRESSION: 1. Mid to distal mechanical small bowel obstruction with transition point identified in the right lower quadrant, probably due to adhesions, appearing moderate to high-grade. No bowel wall thickening. No perforation. 2. Small volume perihepatic and pelvic ascites. No focal fluid collections. 3. Indeterminate hypodense 1.1 cm interpolar right renal cortical lesion. Outpatient evaluation with MRI abdomen without with IV contrast recommended to exclude renal neoplasm. This recommendation follows ACR consensus guidelines: Management of the Incidental Renal Mass on CT: A White Paper of the ACR Incidental Findings Committee. J Am Coll Radiol 2018;15:264-273. 4.  Aortic Atherosclerosis (ICD10-I70.0). Electronically Signed   By: Ilona Sorrel M.D.   On: 05/14/2017 13:26   Dg Abd Portable 1v-small Bowel Obstruction Protocol-24 Hr Delay  Result Date: 05/16/2017 CLINICAL DATA:  24 hour image for small bowel protocol. Pt is feeling very well today without NG tube in. Abdomen is less distended and is not in pain as much. Pt still taking the  pain meds prescribed to her. EXAM: PORTABLE ABDOMEN - 1 VIEW COMPARISON:  05/15/2017 FINDINGS: There is oral contrast material in the colon which has decreased in amount compared with 05/15/2017. There has been interval removal of the nasogastric tube. There is no bowel dilatation to suggest obstruction. There is no evidence of pneumoperitoneum, portal venous gas or pneumatosis. There are no pathologic calcifications along the expected course of the ureters. The osseous structures are unremarkable. IMPRESSION: Oral contrast material in the colon which has decreased in amount compared with 05/15/2017. Interval removal of the nasogastric tube. No bowel dilatation to suggest obstruction. Electronically Signed   By: Kathreen Devoid   On: 05/16/2017 11:58   Dg Abd Portable 1v-small Bowel Obstruction Protocol-initial, 8 Hr Delay  Result Date: 05/15/2017 CLINICAL DATA:  Small bowel obstruction, improved on radiographs earlier today. EXAM: PORTABLE ABDOMEN - 1 VIEW COMPARISON:  Earlier today. FINDINGS: The current image was obtained 8 hours after administration of Gastrografin through the patient's nasogastric tube. This demonstrates contrast throughout normal caliber colon. There is a single mildly dilated loop of small bowel in the left mid to lower abdomen. No retained contrast is seen in the small bowel. The nasogastric tube tip is in the proximal stomach and side hole in the region of the gastroesophageal junction. Unremarkable bones. IMPRESSION: 1. Normal passage of contrast through the small bowel and into the colon.  2. Single mildly dilated loop of small bowel in the left mid to lower abdomen. This could be due to focal ileus or minimal partial obstruction. Electronically Signed   By: Claudie Revering M.D.   On: 05/15/2017 19:37   Dg Abd Portable 2v  Result Date: 05/15/2017 CLINICAL DATA:  Small bowel obstruction EXAM: PORTABLE ABDOMEN - 2 VIEW COMPARISON:  05/14/2017 FINDINGS: Nasogastric tube extends into the  stomach. Reduced degree of small bowel dilatation. There continues to be a lesser degree of dilatation and stacking of small bowel loops. Air is present throughout the colon to the rectum. Left lateral decubitus view is negative for free air. IMPRESSION: Persistent mildly dilated small bowel, improved, with a lesser degree of dilatation compared to 05/14/2017. No free air. Electronically Signed   By: Andreas Newport M.D.   On: 05/15/2017 06:49    Anti-infectives: Anti-infectives    None       Assessment/Plan HLD GERD Right renal cortical lesion - will need outpatient evaluation  SBO, likely due to adhesions - prior h/o multiple abdominal surgeries - CT scan 9/2 showed Mid to distal mechanical small bowel obstruction with transition point identified in the right lower quadrant, probably due to Adhesions - XR today shows contrast in the colon, no bowel dilation  ID - none FEN - IVF, full liquids VTE - SCDs, heparin Foley - none Follow up - TBD  Plan - advance to full liquids. Continue ambulating. If she continues to progress well we can advance to soft diet tomorrow and hopefully discharge in the afternoon.   LOS: 2 days    Wellington Hampshire , [facility] Surgery 05/16/2017, 12:14 PM Pager: [phone] Consults: [phone] Mon-Fri 7:00 am-4:30 pm Sat-Sun 7:00 am-11:30 am

## 2017-05-16 NOTE — Consult Note (Signed)
   [facility] CM Inpatient Consult   05/16/2017  ITZEL MCKIBBIN [birth year] [id number]    Came to visit [name] on behalf of [place] to Wellness program for [employer] employees/dependents with [facility] insurance.   Discussed Link to Wellness program. Mrs. Harshbarger pleasantly denies having any Link to Wellness needs.   Confirmed best contact number for post discharge call as [phone].   Provided Link to [employer], 24-hr nurse line magnet, and contact information.   Appreciative of visit.  Marthenia Rolling, MSN-Ed, RN,BSN [facility] Liaison [phone]

## 2017-05-17 MED ORDER — ACETAMINOPHEN 500 MG PO TABS: 1000.0000 mg | ORAL_TABLET | Freq: Three times a day (TID) | ORAL | 0 refills | Status: DC | PRN

## 2017-05-17 NOTE — Progress Notes (Signed)
[facility] Progress Note     Subjective: CC:  Denies abdominal pain at rest, some mild soreness with palpation. Tolerated SOFT diet for breakfast. +flatus. Having loose BMs. Eager to go home. Employed as a CMA at [employer].  Objective: Vital signs in last 24 hours: Temp:  [97.5 F (36.4 C)-97.9 F (36.6 C)] 97.8 F (36.6 C) (09/05 0530) Pulse Rate:  [63-72] 63 (09/05 0530) Resp:  [18] 18 (09/05 0530) BP: (115-123)/(66-85) 122/73 (09/05 0530) SpO2:  [97 %-100 %] 97 % (09/05 0530) Last BM Date: 05/17/17  Intake/Output from previous day: 09/04 0701 - 09/05 0700 In: 360 [P.O.:360] Out: 400 [Urine:400] Intake/Output this shift: No intake/output data recorded.  PE: Gen:  Alert, NAD, pleasant Card:  Regular rate and rhythm, pedal pulses 2+ BL Pulm:  Normal effort, clear to auscultation bilaterally Abd: Soft, non-tender, non-distended, bowel sounds present in all 4 quadrants, previous laparotomy scar noted. Skin: warm and dry, no rashes  Psych: A&Ox3   Lab Results:   Recent Labs  05/14/17 1042 05/15/17 0343  WBC 19.8* 8.5  HGB 17.1* 12.0  HCT 48.4* 35.9*  PLT 349 251   BMET  Recent Labs  05/14/17 1042 05/15/17 0343  NA 140 144  K 3.3* 3.7  CL 96* 112*  CO2 25 26  GLUCOSE 160* 113*  BUN 22* 18  CREATININE 1.16* 0.79  CALCIUM 10.7* 7.9*   PT/INR No results for input(s): LABPROT, INR in the last 72 hours. CMP     Component Value Date/Time   NA 144 05/15/2017 0343   K 3.7 05/15/2017 0343   CL 112 (H) 05/15/2017 0343   CO2 26 05/15/2017 0343   GLUCOSE 113 (H) 05/15/2017 0343   BUN 18 05/15/2017 0343   CREATININE 0.79 05/15/2017 0343   CALCIUM 7.9 (L) 05/15/2017 0343   PROT 8.6 (H) 05/14/2017 1042   ALBUMIN 5.1 (H) 05/14/2017 1042   AST 25 05/14/2017 1042   ALT 16 05/14/2017 1042   ALKPHOS 63 05/14/2017 1042   BILITOT 1.1 05/14/2017 1042   GFRNONAA >60 05/15/2017 0343   GFRAA >60 05/15/2017 0343   Lipase     Component Value  Date/Time   LIPASE 26 05/14/2017 1042       Studies/Results: Dg Abd Portable 1v-small Bowel Obstruction Protocol-24 Hr Delay  Result Date: 05/16/2017 CLINICAL DATA:  24 hour image for small bowel protocol. Pt is feeling very well today without NG tube in. Abdomen is less distended and is not in pain as much. Pt still taking the pain meds prescribed to her. EXAM: PORTABLE ABDOMEN - 1 VIEW COMPARISON:  05/15/2017 FINDINGS: There is oral contrast material in the colon which has decreased in amount compared with 05/15/2017. There has been interval removal of the nasogastric tube. There is no bowel dilatation to suggest obstruction. There is no evidence of pneumoperitoneum, portal venous gas or pneumatosis. There are no pathologic calcifications along the expected course of the ureters. The osseous structures are unremarkable. IMPRESSION: Oral contrast material in the colon which has decreased in amount compared with 05/15/2017. Interval removal of the nasogastric tube. No bowel dilatation to suggest obstruction. Electronically Signed   By: Kathreen Devoid   On: 05/16/2017 11:58   Dg Abd Portable 1v-small Bowel Obstruction Protocol-initial, 8 Hr Delay  Result Date: 05/15/2017 CLINICAL DATA:  Small bowel obstruction, improved on radiographs earlier today. EXAM: PORTABLE ABDOMEN - 1 VIEW COMPARISON:  Earlier today. FINDINGS: The current image was obtained 8 hours after administration of Gastrografin  through the patient's nasogastric tube. This demonstrates contrast throughout normal caliber colon. There is a single mildly dilated loop of small bowel in the left mid to lower abdomen. No retained contrast is seen in the small bowel. The nasogastric tube tip is in the proximal stomach and side hole in the region of the gastroesophageal junction. Unremarkable bones. IMPRESSION: 1. Normal passage of contrast through the small bowel and into the colon. 2. Single mildly dilated loop of small bowel in the left mid to  lower abdomen. This could be due to focal ileus or minimal partial obstruction. Electronically Signed   By: Claudie Revering M.D.   On: 05/15/2017 19:37    Anti-infectives: Anti-infectives    None     Assessment/Plan SBO - recurrent. Tolerating SOFT diet. Having bowel function. Stable for discharge home.     LOS: 3 days    [facility] 05/17/2017, 8:01 AM Pager: [phone] Consults: [phone] Mon-Fri 7:00 am-4:30 pm Sat-Sun 7:00 am-11:30 am

## 2017-05-17 NOTE — Progress Notes (Signed)
Pt alert and oriented, tolerating diet, no complaints of pain, nausea or vomiting. D/Cinstructions given, all questions answered.

## 2017-05-17 NOTE — Discharge Instructions (Signed)
Low-Fiber Diet °Fiber is found in fruits, vegetables, and whole grains. A low-fiber diet restricts fibrous foods that are not digested in the small intestine. A diet containing about 10-15 grams of fiber per day is considered low fiber. Low-fiber diets may be used to: °· Promote healing and rest the bowel during intestinal flare-ups. °· Prevent blockage of a partially obstructed or narrowed gastrointestinal tract. °· Reduce fecal weight and volume. °· Slow the movement of feces. ° °You may be on a low-fiber diet as a transitional diet following surgery, after an injury (trauma), or because of a short (acute) or lifelong (chronic) illness. Your health care provider will determine the length of time you need to stay on this diet. °What do I need to know about a low-fiber diet? °Always check the fiber content on the packaging's Nutrition Facts label, especially on foods from the grains list. Ask your dietitian if you have questions about specific foods that are related to your condition, especially if the food is not listed below. In general, a low-fiber food will have less than 2 g of fiber. °What foods can I eat? °Grains °All breads and crackers made with white flour. Sweet rolls, doughnuts, waffles, pancakes, French toast, bagels. Pretzels, Melba toast, zwieback. Well-cooked cereals, such as cornmeal, farina, or cream cereals. Dry cereals that do not contain whole grains, fruit, or nuts, such as refined corn, wheat, rice, and oat cereals. Potatoes prepared any way without skins, plain pastas and noodles, refined white rice. Use white flour for baking and making sauces. Use allowed list of grains for casseroles, dumplings, and puddings. °Vegetables °Strained tomato and vegetable juices. Fresh lettuce, cucumber, spinach. Well-cooked (no skin or pulp) or canned vegetables, such as asparagus, bean sprouts, beets, carrots, green beans, mushrooms, potatoes, pumpkin, spinach, yellow squash, tomato sauce/puree, turnips,  yams, and zucchini. Keep servings limited to ½ cup. °Fruits °All fruit juices except prune juice. Cooked or canned fruits without skin and seeds, such as applesauce, apricots, cherries, fruit cocktail, grapefruit, grapes, mandarin oranges, melons, peaches, pears, pineapple, and plums. Fresh fruits without skin, such as apricots, avocados, bananas, melons, pineapple, nectarines, and peaches. Keep servings limited to ½ cup or 1 piece. °Meat and Other Protein Sources °Ground or well-cooked tender beef, ham, veal, lamb, pork, or poultry. Eggs, plain cheese. Fish, oysters, shrimp, lobster, and other seafood. Liver, organ meats. Smooth nut butters. °Dairy °All milk products and alternative dairy substitutes, such as soy, rice, almond, and coconut, not containing added whole nuts, seeds, or added fruit. °Beverages °Decaf coffee, fruit, and vegetable juices or smoothies (small amounts, with no pulp or skins, and with fruits from allowed list), sports drinks, herbal tea. °Condiments °Ketchup, mustard, vinegar, cream sauce, cheese sauce, cocoa powder. Spices in moderation, such as allspice, basil, bay leaves, celery powder or leaves, cinnamon, cumin powder, curry powder, ginger, mace, marjoram, onion or garlic powder, oregano, paprika, parsley flakes, ground pepper, rosemary, sage, savory, tarragon, thyme, and turmeric. °Sweets and Desserts °Plain cakes and cookies, pie made with allowed fruit, pudding, custard, cream pie. Gelatin, fruit, ice, sherbet, frozen ice pops. Ice cream, ice milk without nuts. Plain hard candy, honey, jelly, molasses, syrup, sugar, chocolate syrup, gumdrops, marshmallows. Limit overall sugar intake. °Fats and Oil °Margarine, butter, cream, mayonnaise, salad oils, plain salad dressings made from allowed foods. Choose healthy fats such as olive oil, canola oil, and omega-3 fatty acids (such as found in salmon or tuna) when possible. °Other °Bouillon, broth, or cream soups made from allowed foods. Any    strained soup. Casseroles or mixed dishes made with allowed foods. The items listed above may not be a complete list of recommended foods or beverages. Contact your dietitian for more options. What foods are not recommended? Grains All whole wheat and whole grain breads and crackers. Multigrains, rye, bran seeds, nuts, or coconut. Cereals containing whole grains, multigrains, bran, coconut, nuts, raisins. Cooked or dry oatmeal, steel-cut oats. Coarse wheat cereals, granola. Cereals advertised as high fiber. Potato skins. Whole grain pasta, wild or brown rice. Popcorn. Coconut flour. Bran, buckwheat, corn bread, multigrains, rye, wheat germ. Vegetables Fresh, cooked or canned vegetables, such as artichokes, asparagus, beet greens, broccoli, Brussels sprouts, cabbage, celery, cauliflower, corn, eggplant, kale, legumes or beans, okra, peas, and tomatoes. Avoid large servings of any vegetables, especially raw vegetables. Fruits Fresh fruits, such as apples with or without skin, berries, cherries, figs, grapes, grapefruit, guavas, kiwis, mangoes, oranges, papayas, pears, persimmons, pineapple, and pomegranate. Prune juice and juices with pulp, stewed or dried prunes. Dried fruits, dates, raisins. Fruit seeds or skins. Avoid large servings of all fresh fruits. Meats and Other Protein Sources Tough, fibrous meats with gristle. Chunky nut butter. Cheese made with seeds, nuts, or other foods not recommended. Nuts, seeds, legumes (beans, including baked beans), dried peas, beans, lentils. Dairy Yogurt or cheese that contains nuts, seeds, or added fruit. Beverages Fruit juices with high pulp, prune juice. Caffeinated coffee and teas. Condiments Coconut, maple syrup, pickles, olives. Sweets and Desserts Desserts, cookies, or candies that contain nuts or coconut, chunky peanut butter, dried fruits. Jams, preserves with seeds, marmalade. Large amounts of sugar and sweets. Any other dessert made with fruits from  the not recommended list. Other Soups made from vegetables that are not recommended or that contain other foods not recommended. The items listed above may not be a complete list of foods and beverages to avoid. Contact your dietitian for more information. This information is not intended to replace advice given to you by your health care provider. Make sure you discuss any questions you have with your health care provider. Document Released: 02/18/2002 Document Revised: 02/04/2016 Document Reviewed: 07/22/2013 Elsevier Interactive Patient Education  2017 [place] Bowel Obstruction A small bowel obstruction means that something is blocking the small bowel. The small bowel is also called the small intestine. It is the long tube that connects the stomach to the colon. An obstruction will stop food and fluids from passing through the small bowel. Treatment depends on what is causing the problem and how bad the problem is. Follow these instructions at home:  Get a lot of rest.  Follow your diet as told by your doctor. You may need to: ? Only drink clear liquids until you start to get better. ? Avoid solid foods as told by your doctor.  Take over-the-counter and prescription medicines only as told by your doctor.  Keep all follow-up visits as told by your doctor. This is important. Contact a doctor if:  You have a fever.  You have chills. Get help right away if:  You have pain or cramps that get worse.  You throw up (vomit) blood.  You have a feeling of being sick to your stomach (nausea) that does not go away.  You cannot stop throwing up.  You cannot drink fluids.  You feel confused.  You feel dry or thirsty (dehydrated).  Your belly gets more bloated.  You feel weak or you pass out (faint). This information is not intended to replace advice  given to you by your health care provider. Make sure you discuss any questions you have with your health care  provider. Document Released: 10/06/2004 Document Revised: 04/25/2016 Document Reviewed: 10/23/2014 Elsevier Interactive Patient Education  [employer].

## 2017-05-18 ENCOUNTER — Encounter: Payer: Self-pay | Admitting: *Deleted

## 2017-05-18 ENCOUNTER — Telehealth: Payer: Self-pay | Admitting: *Deleted

## 2017-05-18 ENCOUNTER — Other Ambulatory Visit: Payer: Self-pay | Admitting: *Deleted

## 2017-05-18 NOTE — Telephone Encounter (Signed)
Transition Care Management Follow-up Telephone Call   Date discharged? 05/17/2017   How have you been since you were released from the hospital? "Well"   Do you understand why you were in the hospital? yes   Do you understand the discharge instructions? yes   Where were you discharged to? Home   Items Reviewed:  Medications reviewed: yes  Allergies reviewed: yes  Dietary changes reviewed: yes. Pt is following a low fiber diet per hospital dc instructions. Pt is only eating soft foods currently, slowly trying to increase this. Pt states she will alert PCP if her bowels do not return to normal.  Referrals reviewed: yes   Functional Questionnaire:   Activities of Daily Living (ADLs):   She states they are independent in the following: ambulation, bathing and hygiene, feeding, continence, grooming, toileting and dressing States they require assistance with the following: None   Any transportation issues/concerns?: no   Any patient concerns? Pt states that a CT of her abd revealed a lesion on her kidney that may need follow up and she would like PCP to look at it.    Confirmed importance and date/time of follow-up visits scheduled no  Pt states she does not need to schedule a follow up.  Confirmed with patient if condition begins to worsen call PCP or go to the ER.  Patient was given the office number and encouraged to call back with question or concerns.  : yes

## 2017-05-18 NOTE — Patient Outreach (Signed)
[facility]) Care Management  05/18/2017  KASAUNDRA FAHRNEY [birth year] [id number]  Subjective: Telephone call to patient's home / mobile number, spoke with patient, and HIPAA verified.  Discussed [facility] Care Management [facility] Transition of care follow up, patient voiced understanding, and is in agreement to follow up.   Patient states she is doing well, has a supportive husband, received follow up call from primary provider's office today, and does not need to schedule follow up appointment.   States she did not have to have surgery and will follow up as needed. Patient voices understanding of medical diagnosis and treatment plan.  States she is accessing the following [facility] benefits: outpatient pharmacy, hospital indemnity (verbally given [place] [facility] contact information [phone], will follow up regarding benefits, will file claim if appropriate), and  family medical leave act (FMLA, not eligible and does not need at this time).  Patient states she is a new [employer] employee and planning to return to work on 05/19/17.   Patient states he does not have any education material, transition of care, care coordination, disease management, disease monitoring, transportation, community resource, or pharmacy needs at this time. States she is very appreciative of the follow up and is in agreement to receive [facility] Management information.    Objective: Per chart review, patient hospitalized 05/14/17 - 05/17/17 for small bowel obstruction.  Patient also has a history of hyperlipidemia.   Assessment: Received [facility] Transition of care referral on 05/16/17.  Transition of care follow up completed, no care management needs, and will proceed with case closure.    Plan: RNCM will send patient successful outreach letter, [facility] pamphlet, and magnet. RNCM will send case closure due to follow up completed / no care management needs request to [facility].   Bryer Gottsch H. Annia Friendly, BSN, [place] [facility] Telephonic CM Phone: [phone] Fax: [phone]

## 2017-05-19 ENCOUNTER — Encounter: Payer: Self-pay | Admitting: Family Medicine

## 2017-05-19 NOTE — Discharge Summary (Signed)
[facility] Discharge Summary   Patient ID: Jennifer [name] MRN: [id number] DOB/AGE: 14-Dec-1960 56 y.o.  Admit date: 05/14/2017 Discharge date: 05/19/2017 Discharge Diagnosis Patient Active Problem List   Diagnosis Date Noted  . SBO (small bowel obstruction) ([place]) 05/14/2017  . GERD (gastroesophageal reflux disease) 03/04/2017    Consultants N/A   Imaging: 05/14/17 CT ABD PELV W CONTRAST - 1. Mid to distal mechanical small bowel obstruction with transition point identified in the right lower quadrant, probably due to adhesions, appearing moderate to high-grade. No bowel wall thickening. No perforation.  05/14/17 DG ABD - NG tube in good position.  05/15/17 DG ABD - Normal passage of contrast through the small bowel and into the Colon. Single mildly dilated loop of small bowel in the left mid to lower abdomen. This could be due to focal ileus or minimal partial obstruction.  05/16/17 DG ABD - Oral contrast material in the colon which has decreased in amount compared with 05/15/2017. Interval removal of the nasogastric tube. No bowel dilatation to suggest obstruction.  Procedures None  Hospital Course:  Jennifer [name] is a 56 year old white female who presented [facility] emergency department on 05/14/17 with 24 hours of abdominal pain. Associated symptoms included nausea and vomiting. 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 Workup in the emergency Department significant for moderate to high-grade small bowel obstruction with right lower quadrant transition zone as noted on CT scan above. The patient was admitted [facility] or further management. Nasogastric tube was placed and small bowel protocol performed. The patient's obstruction resolved with nonoperative management. Nasogastric tube was removed, bowel function returned,and patient's diet was gradually advanced. On 05/18/17 the  patient's vitals were stable, she denied abdominal pain, tolerating oral intake, having bowel function, mobilizing and medically stable for discharge. She may follow-up with our [facility] [place] [facility] as needed.   Allergies as of 05/17/2017      Reactions   Codeine Nausea And Vomiting      Medication List    TAKE these medications   acetaminophen 500 MG tablet Commonly known as:  TYLENOL Take 2 tablets (1,000 mg total) by mouth every 8 (eight) hours as needed for mild pain, moderate pain or headache.   ferrous sulfate 325 (65 FE) MG tablet   ibuprofen 200 MG tablet Commonly known as:  ADVIL,MOTRIN Take 400 mg by mouth every 6 (six) hours as needed for mild pain.   NONFORMULARY OR COMPOUNDED ITEM Apply 5 drops topically daily.   progesterone 100 MG capsule Commonly known as:  PROMETRIUM   ranitidine 150 MG tablet Commonly known as:  ZANTAC Take 150 mg by mouth at bedtime.   Vitamin D-3 1000 units Caps            Discharge Care Instructions        Start     Ordered   05/17/17 0000  acetaminophen (TYLENOL) 500 MG tablet  Every 8 hours PRN     05/17/17 0913   05/17/17 0000  Discharge patient    Question Answer Comment  Discharge disposition 01-Home or Self Care   Discharge patient date 05/17/2017      05/17/17 0913   05/16/17 0000  AMB Referral to [facility]    Comments:  Please assign UMR member for post discharge call. Currently at [facility]. Marthenia Rolling, MSN-Ed, [facility] [phone]  Question Answer Comment  Reason for consult Please  assign UMR member for post discharge call   Expected date of contact 1-3 days (reserved for hospital discharges)      05/16/17 1238       Follow-up [facility] Follow up.   Specialty:  General Surgery Why:  call as needed. Contact information: [place] [phone]           Signed: Obie Dredge, [facility] 05/19/2017, 2:06 PM Pager: [phone] Consults: [phone] Mon-Fri 7:00 am-4:30 pm Sat-Sun 7:00 am-11:30 am

## 2017-05-22 ENCOUNTER — Ambulatory Visit
Admission: RE | Admit: 2017-05-22 | Discharge: 2017-05-22 | Disposition: A | Discharge status: Home / Self Care | Payer: 59 | Source: Ambulatory Visit | Attending: Family Medicine | Admitting: Family Medicine

## 2017-05-22 ENCOUNTER — Other Ambulatory Visit: Payer: Self-pay | Admitting: Family Medicine

## 2017-05-22 DIAGNOSIS — Z1231 Encounter for screening mammogram for malignant neoplasm of breast: Secondary | ICD-10-CM

## 2017-05-22 DIAGNOSIS — N2889 Other specified disorders of kidney and ureter: Secondary | ICD-10-CM

## 2017-05-29 ENCOUNTER — Encounter: Payer: Self-pay | Admitting: Family Medicine

## 2017-05-29 ENCOUNTER — Ambulatory Visit (HOSPITAL_COMMUNITY)
Admission: RE | Admit: 2017-05-29 | Discharge: 2017-05-29 | Disposition: A | Discharge status: Home / Self Care | Payer: 59 | Source: Ambulatory Visit | Attending: Family Medicine | Admitting: Family Medicine

## 2017-05-29 DIAGNOSIS — N2889 Other specified disorders of kidney and ureter: Secondary | ICD-10-CM

## 2017-05-29 DIAGNOSIS — N281 Cyst of kidney, acquired: Secondary | ICD-10-CM | POA: Insufficient documentation

## 2017-05-29 MED ORDER — GADOBENATE DIMEGLUMINE 529 MG/ML IV SOLN
15.0000 mL | Freq: Once | INTRAVENOUS | Status: AC | PRN
  Administered 2017-05-29: 12 mL via INTRAVENOUS

## 2017-05-30 MED ORDER — GADOBENATE DIMEGLUMINE 529 MG/ML IV SOLN
15.0000 mL | Freq: Once | INTRAVENOUS | Status: AC | PRN
  Administered 2017-05-30: 12 mL via INTRAVENOUS

## 2017-06-06 ENCOUNTER — Other Ambulatory Visit: Payer: Self-pay | Admitting: Obstetrics and Gynecology

## 2017-06-23 ENCOUNTER — Other Ambulatory Visit: Payer: Self-pay

## 2017-06-23 MED ORDER — PROGESTERONE MICRONIZED 100 MG PO CAPS
100.0000 mg | ORAL_CAPSULE | Freq: Every day | ORAL | 3 refills | Status: DC
Start: 2017-06-23 — End: 2018-08-01

## 2017-06-23 MED FILL — PROGESTERONE 100 MG CAPSULE: 100 | 90 days supply | Qty: 90 | Fill #0

## 2017-06-23 NOTE — Telephone Encounter (Signed)
Pt left v/m requesting 90 day refill progesterone caps; pt spoke with [name] about med at establish visit on 03/01/17. No future appt scheduled. Pt recently moved to this area from [facility]. [place]. Pt request cb when refilled.

## 2017-08-01 ENCOUNTER — Ambulatory Visit: Payer: Self-pay

## 2017-08-01 ENCOUNTER — Encounter: Payer: Self-pay | Admitting: Family Medicine

## 2017-08-01 ENCOUNTER — Ambulatory Visit (INDEPENDENT_AMBULATORY_CARE_PROVIDER_SITE_OTHER): Payer: 59 | Admitting: Family Medicine

## 2017-08-01 VITALS — BP 104/82 | HR 86 | Ht 64.0 in | Wt 126.0 lb

## 2017-08-01 DIAGNOSIS — M25569 Pain in unspecified knee: Secondary | ICD-10-CM

## 2017-08-01 DIAGNOSIS — M76891 Other specified enthesopathies of right lower limb, excluding foot: Secondary | ICD-10-CM

## 2017-08-01 MED ORDER — DICLOFENAC SODIUM 2 % TD SOLN: 2.0000 g | Freq: Two times a day (BID) | TRANSDERMAL | 3 refills | Status: DC

## 2017-08-01 NOTE — Progress Notes (Signed)
[name] [address] [place], [place] [address] Phone: [phone] Subjective:     CC: Right knee pain  OVF:[id number]  Jennifer [name] is a 56 y.o. female coming in for right knee pain. She has been having pain in the posterior aspect of the knee. She has a hard time with deep knee bending. Most of her pain is when she tries to stand back up.  Patient states that she can walk and do anything else without any significant pain.  Has taken anti-inflammatories which is been helpful.  Patient states that as long as she does well she does decent but then when she flexes all the way she has a severe amount of pain that feels like the knee is giving out on her.  Does not remember any specific injury.  Rates the severity of pain when it occurs is 9 out of 10 and only happens for seconds.      Past Medical History:  Diagnosis Date  . Bowel obstruction ([place])   . GERD (gastroesophageal reflux disease)   . Hyperlipidemia    Past Surgical History:  Procedure Laterality Date  . ABDOMINAL HYSTERECTOMY    . ROTATOR CUFF REPAIR Right 2017   Social History   Socioeconomic History  . Marital status: Married    Spouse name: None  . Number of children: None  . Years of education: None  . Highest education level: None  Social Needs  . Financial resource strain: None  . Food insecurity - worry: None  . Food insecurity - inability: None  . Transportation needs - medical: None  . Transportation needs - non-medical: None  Occupational History  . None  Tobacco Use  . Smoking status: Never Smoker  . Smokeless tobacco: Never Used  Substance and Sexual Activity  . Alcohol use: Yes    Alcohol/week: 1.2 oz    Types: 2 Glasses of wine per week  . Drug use: No  . Sexual activity: Yes    Partners: Male    Birth control/protection: None  Other Topics Concern  . None  Social History Narrative  . None   Allergies  Allergen Reactions  . Codeine Nausea And  Vomiting   Family History  Problem Relation Age of Onset  . Cancer Mother   . Early death Mother   . Diabetes Father   . Hyperlipidemia Father   . Heart disease Father   . Cancer Maternal Grandmother   . Cancer Paternal Grandmother      Past medical history, social, surgical and family history all reviewed in electronic medical record.  No pertanent information unless stated regarding to the chief complaint.   Review of Systems:Review of systems updated and as accurate as of 08/01/17  No headache, visual changes, nausea, vomiting, diarrhea, constipation, dizziness, abdominal pain, skin rash, fevers, chills, night sweats, weight loss, swollen lymph nodes, body aches, joint swelling, muscle aches, chest pain, shortness of breath, mood changes.   Objective  Blood pressure 104/82, pulse 86, height 5\' 4"  (1.626 m), weight 126 lb (57.2 kg), SpO2 97 %. Systems examined below as of 08/01/17   General: No apparent distress alert and oriented x3 mood and affect normal, dressed appropriately.  HEENT: Pupils equal, extraocular movements intact  Respiratory: Patient's speak in full sentences and does not appear short of breath  Cardiovascular: No lower extremity edema, non tender, no erythema  Skin: Warm dry intact with no signs of infection or rash on extremities or on  axial skeleton.  Abdomen: Soft nontender  Neuro: Cranial nerves II through XII are intact, neurovascularly intact in all extremities with 2+ DTRs and 2+ pulses.  Lymph: No lymphadenopathy of posterior or anterior cervical chain or axillae bilaterally.  Gait normal with good balance and coordination.  MSK:  Non tender with full range of motion and good stability and symmetric strength and tone of shoulders, elbows, wrist, hip, and ankles bilaterally.  Knee: Right Normal to inspection with no erythema or effusion or obvious bony abnormalities. Palpation normal with no warmth, joint line tenderness, patellar tenderness, or condyle  tenderness. ROM full in flexion and extension and lower leg rotation. Ligaments with solid consistent endpoints including ACL, PCL, LCL, MCL. Negative Mcmurray's, Apley's, and Thessalonian tests. Non painful patellar compression. Patellar glide without crepitus. Patient does have some mild pain over the lateral aspect of the popliteal fossa.  Negative Thompson test Patellar and quadriceps tendons unremarkable. Hamstring and quadriceps strength is normal. Contralateral knee unremarkable  MSK US performed of: Right knee This study was ordered, performed, and interpreted by Charlann Boxer D.O.  Knee: All structures visualized. 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  Patient also has what appears to be a very small amount of hypoechoic changes around the popliteal tendon within the popliteal fossa.  IMPRESSION: Popliteal tendinitis   Impression and Recommendations:     This case required medical decision making of moderate complexity.      Note: This dictation was prepared with Dragon dictation along with smaller phrase technology. Any transcriptional errors that result from this process are unintentional.

## 2017-08-01 NOTE — Patient Instructions (Signed)
Popliteal tendonitis.  pennsaid pinkie amount topically 2 times daily as needed.  Ice 20 minutes 2 times daily. Usually after activity and before bed. Knee compression sleeve  Try to avoid high impact exercises or bending or flexing knee.  See me again in 3-4 weeks Sorry again for the wait

## 2017-08-01 NOTE — Assessment & Plan Note (Signed)
Popliteal tendinitis.  Discussed with patient in great length.  We discussed icing regimen, compression, topical anti-inflammatories.  We discussed which activities of doing which wants to avoid.  Work with Product/process development scientist to learn home exercises.  Patient will try these different changes and come back and see me again 4 weeks.

## 2017-09-01 ENCOUNTER — Ambulatory Visit: Payer: 59 | Admitting: Family Medicine

## 2017-09-15 ENCOUNTER — Encounter: Payer: Self-pay | Admitting: Family Medicine

## 2017-09-26 MED FILL — PROGESTERONE 100 MG CAPSULE: 100 | 90 days supply | Qty: 90 | Fill #1

## 2017-09-27 MED FILL — raNITIdine HCL 150 MG TABS: 150 | 30 days supply | Qty: 60 | Fill #0

## 2017-10-31 DIAGNOSIS — H43811 Vitreous degeneration, right eye: Secondary | ICD-10-CM | POA: Diagnosis not present

## 2017-11-08 ENCOUNTER — Encounter: Payer: Self-pay | Admitting: Family Medicine

## 2017-11-08 ENCOUNTER — Other Ambulatory Visit: Payer: Self-pay | Admitting: Family Medicine

## 2017-11-08 MED ORDER — RANITIDINE HCL 150 MG PO TABS: 150.0000 mg | ORAL_TABLET | Freq: Two times a day (BID) | ORAL | 3 refills | Status: DC

## 2017-11-19 ENCOUNTER — Encounter: Payer: Self-pay | Admitting: Family Medicine

## 2017-11-23 MED FILL — raNITIdine HCL 150 MG TABS: 150 | 90 days supply | Qty: 180 | Fill #0

## 2017-11-29 ENCOUNTER — Other Ambulatory Visit: Payer: Self-pay | Admitting: Family Medicine

## 2017-11-29 DIAGNOSIS — Z1329 Encounter for screening for other suspected endocrine disorder: Secondary | ICD-10-CM

## 2017-11-29 DIAGNOSIS — D649 Anemia, unspecified: Secondary | ICD-10-CM

## 2017-11-29 DIAGNOSIS — Z13220 Encounter for screening for lipoid disorders: Secondary | ICD-10-CM

## 2017-11-29 DIAGNOSIS — Z1321 Encounter for screening for nutritional disorder: Secondary | ICD-10-CM

## 2017-11-29 DIAGNOSIS — R944 Abnormal results of kidney function studies: Secondary | ICD-10-CM

## 2017-12-14 ENCOUNTER — Ambulatory Visit: Payer: Self-pay

## 2017-12-14 ENCOUNTER — Ambulatory Visit (INDEPENDENT_AMBULATORY_CARE_PROVIDER_SITE_OTHER)
Admission: RE | Admit: 2017-12-14 | Discharge: 2017-12-14 | Disposition: A | Discharge status: Home / Self Care | Payer: 59 | Source: Ambulatory Visit | Attending: Family Medicine | Admitting: Family Medicine

## 2017-12-14 ENCOUNTER — Ambulatory Visit (INDEPENDENT_AMBULATORY_CARE_PROVIDER_SITE_OTHER): Payer: 59 | Admitting: Family Medicine

## 2017-12-14 ENCOUNTER — Encounter: Payer: Self-pay | Admitting: Family Medicine

## 2017-12-14 VITALS — BP 124/68 | HR 84 | Ht 64.0 in | Wt 133.0 lb

## 2017-12-14 DIAGNOSIS — M25569 Pain in unspecified knee: Secondary | ICD-10-CM | POA: Diagnosis not present

## 2017-12-14 DIAGNOSIS — M76891 Other specified enthesopathies of right lower limb, excluding foot: Secondary | ICD-10-CM | POA: Diagnosis not present

## 2017-12-14 DIAGNOSIS — M25561 Pain in right knee: Secondary | ICD-10-CM | POA: Diagnosis not present

## 2017-12-14 NOTE — Patient Instructions (Signed)
Good to see you  Lets get xray today  Aspirin 162mg  daily for maybe superficial phlebitis.  Heel lifts in your shoes to help the calf.  New exercises 3 times a week  Write me in 2-3 weeks and tell me how you are doing and if we need MRI

## 2017-12-14 NOTE — Assessment & Plan Note (Signed)
Popliteus tendinitis.  Patient was having decreasing range of motion that is concerning for a possible intra-articular meniscal tear that could be deeper.  New exercises given today.  Discussed compression, x-rays ordered.  Possible MRI may be necessary if we continue to have this.  Patient's pain is more on the posterior lateral aspect but on ultrasound no signs of any type of mass appreciated at this moment.  Patient is in agreement with the plan at the moment.  Patient will follow up with me again in 2 weeks.

## 2017-12-14 NOTE — Progress Notes (Signed)
[name] [address] [name], [name] [address] Phone: [phone] Subjective:    I'm seeing this patient by the request  of:     CC: Knee follow-up.  ZWC:HENIDPOEUM  Jennifer [name] is a 57 y.o. female coming in with complaint of right knee.  Patient was seen previously and was diagnosed with a popliteal tendinitis.  Patient states that unfortunately never seemed to improve.  Last time seen was nearly 5 months ago.  Patient states that the pain does seem to be worsening.  Has noticed decreasing range of motion as well.  Rates the severity of pain is 7 out of 10.  Patient denies any instability but feels like certain things like even squatting down has become difficult with this knee.      Past Medical History:  Diagnosis Date  . Bowel obstruction ([place])   . GERD (gastroesophageal reflux disease)   . Hyperlipidemia    Past Surgical History:  Procedure Laterality Date  . ABDOMINAL HYSTERECTOMY    . ROTATOR CUFF REPAIR Right 2017   Social History   Socioeconomic History  . Marital status: Married    Spouse name: Not on file  . Number of children: Not on file  . Years of education: Not on file  . Highest education level: Not on file  Occupational History  . Not on file  Social Needs  . Financial resource strain: Not on file  . Food insecurity:    Worry: Not on file    Inability: Not on file  . Transportation needs:    Medical: Not on file    Non-medical: Not on file  Tobacco Use  . Smoking status: Never Smoker  . Smokeless tobacco: Never Used  Substance and Sexual Activity  . Alcohol use: Yes    Alcohol/week: 1.2 oz    Types: 2 Glasses of wine per week  . Drug use: No  . Sexual activity: Yes    Partners: Male    Birth control/protection: None  Lifestyle  . Physical activity:    Days per week: Not on file    Minutes per session: Not on file  . Stress: Not on file  Relationships  . Social connections:    Talks on phone: Not on  file    Gets together: Not on file    Attends religious service: Not on file    Active member of club or organization: Not on file    Attends meetings of clubs or organizations: Not on file    Relationship status: Not on file  Other Topics Concern  . Not on file  Social History Narrative  . Not on file   Allergies  Allergen Reactions  . Codeine Nausea And Vomiting   Family History  Problem Relation Age of Onset  . Cancer Mother   . Early death Mother   . Diabetes Father   . Hyperlipidemia Father   . Heart disease Father   . Cancer Maternal Grandmother   . Cancer Paternal Grandmother      Past medical history, social, surgical and family history all reviewed in electronic medical record.  No pertanent information unless stated regarding to the chief complaint.   Review of Systems:Review of systems updated and as accurate as of 12/14/17  No headache, visual changes, nausea, vomiting, diarrhea, constipation, dizziness, abdominal pain, skin rash, fevers, chills, night sweats, weight loss, swollen lymph nodes, body aches, joint swelling, muscle aches, chest pain, shortness of breath, mood changes.  Objective  Blood pressure 124/68, pulse 84, height 5\' 4"  (1.626 m), weight 133 lb (60.3 kg), SpO2 97 %. Systems examined below as of 12/14/17   General: No apparent distress alert and oriented x3 mood and affect normal, dressed appropriately.  HEENT: Pupils equal, extraocular movements intact  Respiratory: Patient's speak in full sentences and does not appear short of breath  Cardiovascular: No lower extremity edema, non tender, no erythema  Skin: Warm dry intact with no signs of infection or rash on extremities or on axial skeleton.  Abdomen: Soft nontender  Neuro: Cranial nerves II through XII are intact, neurovascularly intact in all extremities with 2+ DTRs and 2+ pulses.  Lymph: No lymphadenopathy of posterior or anterior cervical chain or axillae bilaterally.  Gait normal with  good balance and coordination.  MSK:  Non tender with full range of motion and good stability and symmetric strength and tone of shoulders, elbows, wrist, hip and ankles bilaterally.  Knee: Right Normal to inspection with no erythema or effusion or obvious bony abnormalities. Palpation normal with no warmth, joint line tenderness, patellar tenderness, or condyle tenderness.  Patient is tender in the popliteal fossa but more laterally. ROM shows decreasing in flexion to 110 degrees. Ligaments with solid consistent endpoints including ACL, PCL, LCL, MCL. Negative Mcmurray's, Apley's, and Thessalonian tests. Non painful patellar compression. Patellar glide without crepitus. Patellar and quadriceps tendons unremarkable. Hamstring and quadriceps strength is normal. Contralateral knee unremarkable  MSK US performed of: right knee  This study was ordered, performed, and interpreted by Charlann Boxer D.O.  Knee: All structures visualized.  Very mild superficial phlebitis. Anteromedial, anterolateral, posteromedial, and posterolateral menisci unremarkable without tearing, fraying, effusion, or displacement. Patellar Tendon unremarkable on long and transverse views without effusion. No abnormality of prepatellar bursa. LCL and MCL unremarkable on long and transverse views. No abnormality of origin of medial or lateral head of the gastrocnemius.  IMPRESSION: Unremarkable    Impression and Recommendations:     This case required medical decision making of moderate complexity.      Note: This dictation was prepared with Dragon dictation along with smaller phrase technology. Any transcriptional errors that result from this process are unintentional.

## 2017-12-19 ENCOUNTER — Encounter: Payer: Self-pay | Admitting: Family Medicine

## 2017-12-20 ENCOUNTER — Other Ambulatory Visit (INDEPENDENT_AMBULATORY_CARE_PROVIDER_SITE_OTHER): Payer: 59

## 2017-12-20 ENCOUNTER — Other Ambulatory Visit: Payer: Self-pay | Admitting: Family Medicine

## 2017-12-20 DIAGNOSIS — Z79890 Hormone replacement therapy (postmenopausal): Principal | ICD-10-CM

## 2017-12-20 DIAGNOSIS — Z1321 Encounter for screening for nutritional disorder: Secondary | ICD-10-CM

## 2017-12-20 DIAGNOSIS — Z1329 Encounter for screening for other suspected endocrine disorder: Secondary | ICD-10-CM | POA: Diagnosis not present

## 2017-12-20 DIAGNOSIS — R944 Abnormal results of kidney function studies: Secondary | ICD-10-CM

## 2017-12-20 DIAGNOSIS — D649 Anemia, unspecified: Secondary | ICD-10-CM | POA: Diagnosis not present

## 2017-12-20 DIAGNOSIS — E8940 Asymptomatic postprocedural ovarian failure: Secondary | ICD-10-CM

## 2017-12-20 DIAGNOSIS — Z13220 Encounter for screening for lipoid disorders: Secondary | ICD-10-CM | POA: Diagnosis not present

## 2017-12-20 LAB — CBC WITH DIFFERENTIAL/PLATELET
BASOS ABS: 0.1 10*3/uL (ref 0.0–0.1)
BASOS PCT: 2 % (ref 0.0–3.0)
Eosinophils Absolute: 0.3 10*3/uL (ref 0.0–0.7)
Eosinophils Relative: 6.8 % — ABNORMAL HIGH (ref 0.0–5.0)
HEMATOCRIT: 43.4 % (ref 36.0–46.0)
HEMOGLOBIN: 14.8 g/dL (ref 12.0–15.0)
LYMPHS PCT: 36.7 % (ref 12.0–46.0)
Lymphs Abs: 1.9 10*3/uL (ref 0.7–4.0)
MCHC: 34.1 g/dL (ref 30.0–36.0)
MCV: 88 fl (ref 78.0–100.0)
MONOS PCT: 14.7 % — AB (ref 3.0–12.0)
Monocytes Absolute: 0.7 10*3/uL (ref 0.1–1.0)
NEUTROS ABS: 2 10*3/uL (ref 1.4–7.7)
Neutrophils Relative %: 39.8 % — ABNORMAL LOW (ref 43.0–77.0)
PLATELETS: 248 10*3/uL (ref 150.0–400.0)
RBC: 4.93 Mil/uL (ref 3.87–5.11)
RDW: 13.2 % (ref 11.5–15.5)
WBC: 5.1 10*3/uL (ref 4.0–10.5)

## 2017-12-20 LAB — COMPREHENSIVE METABOLIC PANEL
ALT: 12 U/L (ref 0–35)
AST: 15 U/L (ref 0–37)
Albumin: 4.1 g/dL (ref 3.5–5.2)
Alkaline Phosphatase: 50 U/L (ref 39–117)
BILIRUBIN TOTAL: 0.4 mg/dL (ref 0.2–1.2)
BUN: 11 mg/dL (ref 6–23)
CALCIUM: 9.1 mg/dL (ref 8.4–10.5)
CHLORIDE: 105 meq/L (ref 96–112)
CO2: 30 mEq/L (ref 19–32)
Creatinine, Ser: 0.87 mg/dL (ref 0.40–1.20)
GFR: 72.01 mL/min (ref >60.00)
Glucose, Bld: 91 mg/dL (ref 70–99)
Potassium: 4.5 mEq/L (ref 3.5–5.1)
Sodium: 142 mEq/L (ref 135–145)
Total Protein: 6.9 g/dL (ref 6.0–8.3)

## 2017-12-20 LAB — TSH: TSH: 2.36 u[IU]/mL (ref 0.35–4.50)

## 2017-12-20 LAB — VITAMIN D 25 HYDROXY (VIT D DEFICIENCY, FRACTURES): VITD: 31.81 ng/mL (ref 30.00–100.00)

## 2017-12-20 LAB — LIPID PANEL
Cholesterol: 199 mg/dL (ref 0–200)
HDL: 75.2 mg/dL (ref >39.00)
LDL CALC: 111 mg/dL — AB (ref 0–99)
NONHDL: 124.08
Total CHOL/HDL Ratio: 3
Triglycerides: 63 mg/dL (ref 0.0–149.0)
VLDL: 12.6 mg/dL (ref 0.0–40.0)

## 2017-12-22 ENCOUNTER — Encounter: Payer: Self-pay | Admitting: Family Medicine

## 2017-12-22 ENCOUNTER — Ambulatory Visit (INDEPENDENT_AMBULATORY_CARE_PROVIDER_SITE_OTHER): Payer: 59 | Admitting: Family Medicine

## 2017-12-22 VITALS — BP 116/72 | HR 72 | Temp 97.9°F | Ht 64.0 in | Wt 132.8 lb

## 2017-12-22 DIAGNOSIS — Z0000 Encounter for general adult medical examination without abnormal findings: Secondary | ICD-10-CM | POA: Diagnosis not present

## 2017-12-22 NOTE — Progress Notes (Signed)
Subjective:    Patient ID: Jennifer [name], female    DOB: 04-Oct-1960, 57 y.o.   MRN: [id number]  HPI This is a 57 yo female who presents today for CPE. Has been doing well, staying busy, some increased stress lately. Increased consumption of unhealthy food choices, concerned about recent weight gain. Tends to eat sweets when they are available at work.   HRT- history of total hysterectomy due to fibroids, has been well maintained on Biest drops that she has compounded locally, interested in seeing local gyn who will prescribe.   Last CPE- 2018, gyn Mammo- 05/23/17 Pap- total hysterectomy Colonoscopy- 2017 Flu- annual Eye- Regular Dental- Semi-annual Exercise- walks her dogs several times a day   Past Medical History:  Diagnosis Date  . Bowel obstruction ([place])   . GERD (gastroesophageal reflux disease)   . Hyperlipidemia    Past Surgical History:  Procedure Laterality Date  . ABDOMINAL HYSTERECTOMY    . ROTATOR CUFF REPAIR Right 2017   Family History  Problem Relation Age of Onset  . Cancer Mother   . Early death Mother   . Diabetes Father   . Hyperlipidemia Father   . Heart disease Father   . Cancer Maternal Grandmother   . Cancer Paternal Grandmother    Social History   Tobacco Use  . Smoking status: Never Smoker  . Smokeless tobacco: Never Used  Substance Use Topics  . Alcohol use: Yes    Alcohol/week: 1.2 oz    Types: 2 Glasses of wine per week  . Drug use: No     Review of Systems  Constitutional: Negative.   HENT: Negative.   Cardiovascular: Negative.   Gastrointestinal: Negative.   Endocrine: Negative.   Genitourinary: Negative.   Musculoskeletal:       Right knee pain, currently seeing Dr. Tamala Julian (sports medicine at [facility]) with some improvement.   Skin: Negative.   Allergic/Immunologic: Negative.   Neurological: Negative.   Hematological: Negative.   Psychiatric/Behavioral: Negative.        Objective:   Physical Exam  Constitutional: She  is oriented to person, place, and time. She appears well-developed and well-nourished. No distress.  HENT:  Head: Normocephalic and atraumatic.  Right Ear: External ear normal.  Left Ear: External ear normal.  Mouth/Throat: Oropharynx is clear and moist.  Eyes: Conjunctivae are normal.  Neck: Normal range of motion. Neck supple.  Cardiovascular: Normal rate, regular rhythm and normal heart sounds.  Pulmonary/Chest: Effort normal and breath sounds normal.  Musculoskeletal: She exhibits no edema.  Neurological: She is alert and oriented to person, place, and time.  Skin: Skin is warm and dry. She is not diaphoretic.  Psychiatric: She has a normal mood and affect. Her behavior is normal. Judgment and thought content normal.  Vitals reviewed.     BP 116/72   Pulse 72   Temp 97.9 F (36.6 C) (Oral)   Ht 5\' 4"  (1.626 m)   Wt 132 lb 12 oz (60.2 kg)   SpO2 94%   BMI 22.79 kg/m  Wt Readings from Last 3 Encounters:  12/22/17 132 lb 12 oz (60.2 kg)  12/14/17 133 lb (60.3 kg)  08/01/17 126 lb (57.2 kg)   Depression screen PHQ 2/9 12/22/2017  Decreased Interest 0  Down, Depressed, Hopeless 0  PHQ - 2 Score 0       Assessment & Plan:  1. Annual physical exam - Discussed and encouraged healthy lifestyle choices- adequate sleep, regular exercise, stress management and healthy  food choices.  - Discussed labs that were drawn/resultued earlier in the week.  - Will see if anyone locally is prescribing bio identical hormones.  - follow up in 1 year  Clarene Reamer, FNP-BC  [facility], [facility]  12/24/2017 1:28 PM

## 2017-12-22 NOTE — Progress Notes (Signed)
Subjective:    Patient ID: Jennifer [name], female    DOB: [birth year], 57 y.o.   MRN: [id number]  HPI WREATHA [name] is a 57 y.o. female who presents today for her annual physical.  1) Annual Physical: Pt reports feeling well overall with a little increased stress latelt that is getting better. Her main issue is just trying to find a provider that will keep prescribing her hormone gtts.   2) Weight Gain: Reports eating a healthy serving of protein and vegetables. Eats little carbohydrates. Mostly drinks water and has 1 caffeine free diet soda. Does report   3) Right Knee Pain: Pt is currently being followed by Dr. Tamala Julian with [facility]  Review of Systems  Constitutional: Negative for activity change, appetite change and fatigue.  HENT: Negative for ear pain, hearing loss, trouble swallowing and voice change.   Eyes: Negative for visual disturbance.  Respiratory: Negative for cough, chest tightness and shortness of breath.   Cardiovascular: Negative for chest pain and palpitations.  Gastrointestinal: Negative for abdominal distention, constipation, diarrhea, nausea and vomiting.  Endocrine: Negative for cold intolerance and heat intolerance.  Genitourinary: Negative for difficulty urinating, dysuria, frequency, hematuria and urgency.  Musculoskeletal: Negative for back pain, gait problem and neck pain.  Skin: Negative for color change and rash.  Neurological: Negative for dizziness, weakness, light-headedness and numbness.  Psychiatric/Behavioral: Negative for agitation and dysphoric mood. The patient is not nervous/anxious.       Objective:   Physical Exam  Constitutional: She is oriented to person, place, and time. Vital signs are normal. She appears well-nourished. She is active. No distress.  HENT:  Head: Normocephalic.  Right Ear: Hearing, tympanic membrane, external ear and ear canal normal.  Left Ear: Hearing, tympanic membrane, external ear and ear canal normal.    Eyes: Pupils are equal, round, and reactive to light. Conjunctivae and EOM are normal.  Neck: Normal range of motion. Neck supple. No thyroid mass and no thyromegaly present.  Cardiovascular: Normal rate, regular rhythm, normal heart sounds and normal pulses. Exam reveals no gallop and no friction rub.  No murmur heard. Pulmonary/Chest: Effort normal and breath sounds normal. No accessory muscle usage.  Abdominal: Soft. Normal appearance and bowel sounds are normal. There is no hepatosplenomegaly. There is no tenderness. There is no CVA tenderness. No hernia.  Musculoskeletal: Normal range of motion.  Lymphadenopathy:    She has no cervical adenopathy.  Neurological: She is alert and oriented to person, place, and time. She has normal strength and normal reflexes. No cranial nerve deficit or sensory deficit.  Skin: Skin is warm, dry and intact.  Psychiatric: She has a normal mood and affect. Her speech is normal and behavior is normal.    BP 116/72   Pulse 72   Temp 97.9 F (36.6 C) (Oral)   Ht 5\' 4"  (1.626 m)   Wt 132 lb 12 oz (60.2 kg)   SpO2 94%   BMI 22.79 kg/m    BP Readings from Last 3 Encounters:  12/22/17 116/72  12/14/17 124/68  08/01/17 104/82   Wt Readings from Last 3 Encounters:  12/22/17 132 lb 12 oz (60.2 kg)  12/14/17 133 lb (60.3 kg)  08/01/17 126 lb (57.2 kg)      Assessment & Plan:   1. Annual physical exam - Follow-up I a year for physical - Work on maintaining a healthier diet with    Denita Lung, RN, Adult-Geriatric Nurse Practitioner Student

## 2017-12-22 NOTE — Patient Instructions (Addendum)
[name] will get back to you on finding someone that will continue the progesterone drops.  Work on maintaining a healthy diet and increaseing your complex carb/grain intake.  Remember to get you blood work completed.  It has been a pleasure seeing you today. Denita Lung, RN, Adult-Geriatric Nurse Practitioner Student and Tor Netters, FNP

## 2017-12-24 ENCOUNTER — Encounter: Payer: Self-pay | Admitting: Family Medicine

## 2017-12-26 ENCOUNTER — Encounter: Payer: Self-pay | Admitting: Family Medicine

## 2018-01-05 ENCOUNTER — Encounter: Payer: Self-pay | Admitting: Family Medicine

## 2018-01-08 MED FILL — PROGESTERONE MICRONIZED 100: 100 | 90 days supply | Qty: 90 | Fill #2

## 2018-01-18 ENCOUNTER — Other Ambulatory Visit: Payer: 59

## 2018-01-18 DIAGNOSIS — Z79890 Hormone replacement therapy (postmenopausal): Secondary | ICD-10-CM | POA: Diagnosis not present

## 2018-01-18 DIAGNOSIS — E8940 Asymptomatic postprocedural ovarian failure: Secondary | ICD-10-CM | POA: Diagnosis not present

## 2018-01-19 ENCOUNTER — Other Ambulatory Visit: Payer: 59

## 2018-01-19 DIAGNOSIS — E8940 Asymptomatic postprocedural ovarian failure: Secondary | ICD-10-CM | POA: Diagnosis not present

## 2018-01-19 DIAGNOSIS — Z79890 Hormone replacement therapy (postmenopausal): Secondary | ICD-10-CM | POA: Diagnosis not present

## 2018-01-19 LAB — PROGESTERONE: PROGESTERONE: 16.3 ng/mL

## 2018-01-19 LAB — ESTRADIOL: ESTRADIOL: 151 pg/mL

## 2018-01-19 LAB — TESTOSTERONE, TOTAL, LC/MS/MS

## 2018-01-21 LAB — TESTOSTERONE, TOTAL, LC/MS/MS: Testosterone, Total, LC-MS-MS: 128 ng/dL — ABNORMAL HIGH (ref 2–45)

## 2018-01-22 ENCOUNTER — Encounter: Payer: Self-pay | Admitting: Family Medicine

## 2018-01-29 DIAGNOSIS — N951 Menopausal and female climacteric states: Secondary | ICD-10-CM | POA: Diagnosis not present

## 2018-01-29 DIAGNOSIS — Z1231 Encounter for screening mammogram for malignant neoplasm of breast: Secondary | ICD-10-CM | POA: Diagnosis not present

## 2018-03-07 DIAGNOSIS — M5388 Other specified dorsopathies, sacral and sacrococcygeal region: Secondary | ICD-10-CM | POA: Diagnosis not present

## 2018-03-07 DIAGNOSIS — M9903 Segmental and somatic dysfunction of lumbar region: Secondary | ICD-10-CM | POA: Diagnosis not present

## 2018-03-07 DIAGNOSIS — M9905 Segmental and somatic dysfunction of pelvic region: Secondary | ICD-10-CM | POA: Diagnosis not present

## 2018-03-07 DIAGNOSIS — M5136 Other intervertebral disc degeneration, lumbar region: Secondary | ICD-10-CM | POA: Diagnosis not present

## 2018-04-11 MED FILL — PROGESTERONE 100 MG CAPSULE: 100 | 90 days supply | Qty: 90 | Fill #3

## 2018-04-30 ENCOUNTER — Encounter: Payer: Self-pay | Admitting: Podiatry

## 2018-04-30 ENCOUNTER — Ambulatory Visit (INDEPENDENT_AMBULATORY_CARE_PROVIDER_SITE_OTHER): Payer: 59 | Admitting: Podiatry

## 2018-04-30 VITALS — BP 113/64 | HR 78 | Resp 16

## 2018-04-30 DIAGNOSIS — L600 Ingrowing nail: Secondary | ICD-10-CM | POA: Diagnosis not present

## 2018-04-30 DIAGNOSIS — B351 Tinea unguium: Secondary | ICD-10-CM | POA: Diagnosis not present

## 2018-04-30 NOTE — Progress Notes (Signed)
   Subjective:    Patient ID: Jennifer [name], female    DOB: 04-25-1961, 57 y.o.   MRN: [id number]  HPI    Review of Systems  All other systems reviewed and are negative.      Objective:   Physical Exam        Assessment & Plan:

## 2018-04-30 NOTE — Progress Notes (Signed)
Subjective:   Patient ID: Jennifer [name], female   DOB: 57 y.o.   MRN: [id number]   HPI Patient presents stating the left big toenail has been changing and discolored and now the two thirds of it is abnormal.  Patient does not smoke and likes to be active and states only has occasional pain   Review of Systems  All other systems reviewed and are negative.       Objective:  Physical Exam  Constitutional: She appears well-developed and well-nourished.  Cardiovascular: Intact distal pulses.  Pulmonary/Chest: Effort normal.  Musculoskeletal: Normal range of motion.  Neurological: She is alert.  Skin: Skin is warm.  Nursing note and vitals reviewed.   Neurovascular status intact muscle strength adequate range of motion within normal limits with patient noted to have dystrophic changes distal two thirds of the nailbed left with a natural split in the nail with what appears to be a good growing proximal portion with a damaged distal portion     Assessment:  Probability for trauma with ingrown type toenail deformity along with possibility for mycotic nail infection     Plan:  H&P conditions reviewed and I do think removal of the distal two thirds of the nail with fungal culture would be the best opportunity should have for correction even though with trauma there is no guarantee this nail ever be normal.  Patient wants to get this done but needs to wait until after her niece's wedding in September and will reappoint for [place] to see back

## 2018-05-03 ENCOUNTER — Ambulatory Visit: Payer: Self-pay | Admitting: Podiatry

## 2018-05-21 ENCOUNTER — Observation Stay (HOSPITAL_COMMUNITY): Payer: 59

## 2018-05-21 ENCOUNTER — Emergency Department (HOSPITAL_COMMUNITY): Payer: 59

## 2018-05-21 ENCOUNTER — Inpatient Hospital Stay (HOSPITAL_COMMUNITY)
Admission: EM | Admit: 2018-05-21 | Discharge: 2018-05-24 | DRG: 390 | Disposition: A | Discharge status: Home / Self Care | Payer: 59 | Attending: Surgery | Admitting: Surgery

## 2018-05-21 ENCOUNTER — Other Ambulatory Visit: Payer: Self-pay

## 2018-05-21 ENCOUNTER — Encounter (HOSPITAL_COMMUNITY): Payer: Self-pay

## 2018-05-21 DIAGNOSIS — K56609 Unspecified intestinal obstruction, unspecified as to partial versus complete obstruction: Secondary | ICD-10-CM | POA: Diagnosis present

## 2018-05-21 DIAGNOSIS — K219 Gastro-esophageal reflux disease without esophagitis: Secondary | ICD-10-CM | POA: Diagnosis not present

## 2018-05-21 DIAGNOSIS — K5651 Intestinal adhesions [bands], with partial obstruction: Secondary | ICD-10-CM | POA: Diagnosis not present

## 2018-05-21 DIAGNOSIS — K565 Intestinal adhesions [bands], unspecified as to partial versus complete obstruction: Principal | ICD-10-CM | POA: Diagnosis present

## 2018-05-21 DIAGNOSIS — R112 Nausea with vomiting, unspecified: Secondary | ICD-10-CM | POA: Diagnosis not present

## 2018-05-21 DIAGNOSIS — Z90710 Acquired absence of both cervix and uterus: Secondary | ICD-10-CM | POA: Diagnosis not present

## 2018-05-21 DIAGNOSIS — E785 Hyperlipidemia, unspecified: Secondary | ICD-10-CM | POA: Diagnosis not present

## 2018-05-21 DIAGNOSIS — Z0189 Encounter for other specified special examinations: Secondary | ICD-10-CM

## 2018-05-21 LAB — CBC
HEMATOCRIT: 45.3 % (ref 36.0–46.0)
HEMOGLOBIN: 15 g/dL (ref 12.0–15.0)
MCH: 27.9 pg (ref 26.0–34.0)
MCHC: 33.1 g/dL (ref 30.0–36.0)
MCV: 84.2 fL (ref 78.0–100.0)
Platelets: 342 10*3/uL (ref 150–400)
RBC: 5.38 MIL/uL — AB (ref 3.87–5.11)
RDW: 14 % (ref 11.5–15.5)
WBC: 15.5 10*3/uL — ABNORMAL HIGH (ref 4.0–10.5)

## 2018-05-21 LAB — COMPREHENSIVE METABOLIC PANEL
ALBUMIN: 4.5 g/dL (ref 3.5–5.0)
ALK PHOS: 50 U/L (ref 38–126)
ALT: 15 U/L (ref 0–44)
AST: 18 U/L (ref 15–41)
Anion gap: 17 — ABNORMAL HIGH (ref 5–15)
BILIRUBIN TOTAL: 1.1 mg/dL (ref 0.3–1.2)
BUN: 13 mg/dL (ref 6–20)
CALCIUM: 9.9 mg/dL (ref 8.9–10.3)
CO2: 24 mmol/L (ref 22–32)
Chloride: 100 mmol/L (ref 98–111)
Creatinine, Ser: 0.86 mg/dL (ref 0.44–1.00)
GFR calc non Af Amer: >60 mL/min (ref >60)
Glucose, Bld: 155 mg/dL — ABNORMAL HIGH (ref 70–99)
POTASSIUM: 3.7 mmol/L (ref 3.5–5.1)
SODIUM: 141 mmol/L (ref 135–145)
TOTAL PROTEIN: 7.2 g/dL (ref 6.5–8.1)

## 2018-05-21 LAB — LIPASE, BLOOD: Lipase: 28 U/L (ref 11–51)

## 2018-05-21 MED ORDER — ACETAMINOPHEN 325 MG PO TABS: 650.0000 mg | ORAL_TABLET | Freq: Four times a day (QID) | ORAL | Status: DC | PRN

## 2018-05-21 MED ORDER — SODIUM CHLORIDE 0.9 % IV BOLUS
1000.0000 mL | Freq: Once | INTRAVENOUS | Status: AC
  Administered 2018-05-21: 1000 mL via INTRAVENOUS

## 2018-05-21 MED ORDER — ONDANSETRON HCL 4 MG/2ML IJ SOLN
4.0000 mg | Freq: Once | INTRAMUSCULAR | Status: AC
  Administered 2018-05-21: 4 mg via INTRAVENOUS
  Filled 2018-05-21: qty 2

## 2018-05-21 MED ORDER — ONDANSETRON 4 MG PO TBDP: 4.0000 mg | ORAL_TABLET | Freq: Four times a day (QID) | ORAL | Status: DC | PRN

## 2018-05-21 MED ORDER — SODIUM CHLORIDE 0.9 % IV SOLN
Freq: Once | INTRAVENOUS | Status: AC
  Administered 2018-05-21: 21:00:00 via INTRAVENOUS

## 2018-05-21 MED ORDER — HYDROMORPHONE HCL 1 MG/ML IJ SOLN
1.0000 mg | Freq: Once | INTRAMUSCULAR | Status: AC
  Administered 2018-05-21: 1 mg via INTRAVENOUS
  Filled 2018-05-21: qty 1

## 2018-05-21 MED ORDER — ONDANSETRON HCL 4 MG/2ML IJ SOLN
4.0000 mg | Freq: Four times a day (QID) | INTRAMUSCULAR | Status: DC | PRN
  Administered 2018-05-22 – 2018-05-23 (×5): 4 mg via INTRAVENOUS
  Filled 2018-05-21 (×5): qty 2

## 2018-05-21 MED ORDER — KCL IN DEXTROSE-NACL 20-5-0.45 MEQ/L-%-% IV SOLN
INTRAVENOUS | Status: DC
  Administered 2018-05-22 – 2018-05-24 (×6): via INTRAVENOUS
  Filled 2018-05-21 (×6): qty 1000

## 2018-05-21 MED ORDER — IOPAMIDOL (ISOVUE-300) INJECTION 61%
100.0000 mL | Freq: Once | INTRAVENOUS | Status: AC | PRN
  Administered 2018-05-21: 100 mL via INTRAVENOUS

## 2018-05-21 MED ORDER — ACETAMINOPHEN 650 MG RE SUPP: 650.0000 mg | Freq: Four times a day (QID) | RECTAL | Status: DC | PRN

## 2018-05-21 MED ORDER — HYDROMORPHONE HCL 1 MG/ML IJ SOLN
1.0000 mg | INTRAMUSCULAR | Status: DC | PRN
  Administered 2018-05-22 (×5): 1 mg via INTRAVENOUS
  Filled 2018-05-21: qty 2
  Filled 2018-05-21 (×4): qty 1

## 2018-05-21 MED ORDER — IOPAMIDOL (ISOVUE-300) INJECTION 61%
INTRAVENOUS | Status: AC
  Filled 2018-05-21: qty 100

## 2018-05-21 NOTE — ED Notes (Signed)
Bed: WA22 Expected date:  Expected time:  Means of arrival:  Comments: Hold for triage 1 

## 2018-05-21 NOTE — H&P (Signed)
Jennifer [name] is an 57 y.o. female.    General Surgery [facility]  Chief Complaint: small bowel obstruction, abdominal pain  HPI: Patient is a pleasant 57 year old white female who presented to the emergency department with 24-hour history of generalized abdominal pain, nausea, and emesis.  Patient has a history of small bowel obstructions and was last admitted to our surgical service 1 year ago.  She resolved her obstruction at that time without surgical intervention.  The patient has a history of endometriosis and underwent multiple gynecologic procedures for ablation.  She is also had a hysterectomy.  Patient has required a laparotomy in the past for small bowel obstruction with small bowel resection.  Patient developed generalized abdominal pain which was crampy in nature yesterday.  It improved some this morning.  Patient had a small bowel movement.  She tried to eat some soup and developed severe crampy abdominal pain followed by nausea and vomiting.  She presented to the emergency department.  CT scan abdomen and pelvis shows dilated loops of small bowel with a transition point in the right lower quadrant of the abdomen likely due to adhesions.  General surgery is now called to evaluate and manage.  Past Medical History:  Diagnosis Date  . Bowel obstruction ([place])   . Fibroids   . GERD (gastroesophageal reflux disease)   . H/O small bowel obstruction   . Hyperlipidemia     Past Surgical History:  Procedure Laterality Date  . ABDOMINAL HYSTERECTOMY    . ROTATOR CUFF REPAIR Right 2017    Family History  Problem Relation Age of Onset  . Cancer Mother   . Early death Mother   . Diabetes Father   . Hyperlipidemia Father   . Heart disease Father   . Cancer Maternal Grandmother   . Cancer Paternal Grandmother    Social History:  reports that she has never smoked. She has never used smokeless tobacco. She reports that she drinks about 2.0 standard drinks of  alcohol per week. She reports that she does not use drugs.  Allergies:  Allergies  Allergen Reactions  . Codeine Nausea And Vomiting     (Not in a hospital admission)  Results for orders placed or performed during the hospital encounter of 05/21/18 (from the past 48 hour(s))  Lipase, blood     Status: None   Collection Time: 05/21/18  6:48 PM  Result Value Ref Range   Lipase 28 11 - 51 U/L    Comment: Performed at [facility], [place] [address], [place], [place] [address]  Comprehensive metabolic panel     Status: Abnormal   Collection Time: 05/21/18  6:48 PM  Result Value Ref Range   Sodium 141 135 - 145 mmol/L   Potassium 3.7 3.5 - 5.1 mmol/L   Chloride 100 98 - 111 mmol/L   CO2 24 22 - 32 mmol/L   Glucose, Bld 155 (H) 70 - 99 mg/dL   BUN 13 6 - 20 mg/dL   Creatinine, Ser 0.86 0.44 - 1.00 mg/dL   Calcium 9.9 8.9 - 10.3 mg/dL   Total Protein 7.2 6.5 - 8.1 g/dL   Albumin 4.5 3.5 - 5.0 g/dL   AST 18 15 - 41 U/L   ALT 15 0 - 44 U/L   Alkaline Phosphatase 50 38 - 126 U/L   Total Bilirubin 1.1 0.3 - 1.2 mg/dL   GFR calc non Af Amer >60 >60 mL/min   GFR calc Af Amer >  60 >60 mL/min    Comment: (NOTE) The eGFR has been calculated using the CKD EPI equation. This calculation has not been validated in all clinical situations. eGFR's persistently <60 mL/min signify possible Chronic Kidney Disease.    Anion gap 17 (H) 5 - 15    Comment: Performed at [facility], [place] [address] [place], [place] [address]  CBC     Status: Abnormal   Collection Time: 05/21/18  6:48 PM  Result Value Ref Range   WBC 15.5 (H) 4.0 - 10.5 K/uL   RBC 5.38 (H) 3.87 - 5.11 MIL/uL   Hemoglobin 15.0 12.0 - 15.0 g/dL   HCT 45.3 36.0 - 46.0 %   MCV 84.2 78.0 - 100.0 fL   MCH 27.9 26.0 - 34.0 pg   MCHC 33.1 30.0 - 36.0 g/dL   RDW 14.0 11.5 - 15.5 %   Platelets 342 150 - 400 K/uL    Comment: Performed at [facility], [place] [address],  [place], [place] [address]   Ct Abdomen Pelvis W Contrast  Result Date: 05/21/2018 CLINICAL DATA:  Abdominal bloating EXAM: CT ABDOMEN AND PELVIS WITH CONTRAST TECHNIQUE: Multidetector CT imaging of the abdomen and pelvis was performed using the standard protocol following bolus administration of intravenous contrast. CONTRAST:  132m ISOVUE-300 IOPAMIDOL (ISOVUE-300) INJECTION 61% COMPARISON:  05/14/2017 FINDINGS: Lower chest: Minimal dependent atelectasis Hepatobiliary: Diffuse hepatic steatosis. Tiny hypodensity in the right lobe of the liver is stable. Gallbladder is within normal limits. Pancreas: Unremarkable Spleen: Unremarkable Adrenals/Urinary Tract: Simple cyst in the right kidney. Left kidney and adrenal glands are within normal limits. Bladder is decompressed. Stomach/Bowel: Proximal small bowel is distended. Distal small bowel is decompressed. There is a transition point in the right lower quadrant. See image 59 of series 2. There is no obvious cause for the obstruction. It is most likely due to adhesions. Colon is decompressed. There is no obvious mass in the colon. Appendix is not clearly visualized. Vascular/Lymphatic: Minimal atherosclerotic calcification of the aorta. No abnormal retroperitoneal adenopathy. Reproductive: Uterus is absent.  Adnexa are within normal limits. Other: Scattered free fluid is present about the liver, spleen, and in the pelvis. There is no free intraperitoneal gas to suggest perforation. Musculoskeletal: No acute fracture. IMPRESSION: Small bowel obstruction. The transition point is in the right lower quadrant. It is likely due to adhesions. Ascites. Aortic Atherosclerosis (ICD10-I70.0). Electronically Signed   By: AMarybelle KillingsM.D.   On: 05/21/2018 20:28    Review of Systems  Constitutional: Negative for chills and fever.  HENT: Negative.   Eyes: Negative.   Respiratory: Negative.   Cardiovascular: Negative.   Gastrointestinal: Positive for abdominal pain, nausea  and vomiting. Negative for blood in stool.  Genitourinary: Negative.   Musculoskeletal: Negative.   Skin: Negative.   Neurological: Negative.   Endo/Heme/Allergies: Negative.   Psychiatric/Behavioral: Negative.     Blood pressure 109/78, pulse 76, temperature (S) 99.1 F (37.3 C), temperature source Oral, resp. rate 16, height 5' 4"  (1.626 m), weight 54.4 kg, SpO2 97 %. Physical Exam  Constitutional: She is oriented to person, place, and time. She appears well-developed and well-nourished. No distress.  HENT:  Head: Normocephalic and atraumatic.  Right Ear: External ear normal.  Left Ear: External ear normal.  Mouth/Throat: No oropharyngeal exudate.  Eyes: Pupils are equal, round, and reactive to light. Conjunctivae are normal. No scleral icterus.  Neck: Normal range of motion. Neck supple. No tracheal deviation present. No thyromegaly present.  Cardiovascular:  Normal rate, regular rhythm and normal heart sounds.  No murmur heard. Respiratory: Effort normal and breath sounds normal. No respiratory distress. She has no wheezes.  GI: Soft. Bowel sounds are normal. She exhibits no distension and no mass. There is tenderness (mild, diffuse). There is no rebound and no guarding.  Well healed surgical incisions, no obvious herniae  Musculoskeletal: Normal range of motion. She exhibits no edema or deformity.  Neurological: She is alert and oriented to person, place, and time.  Skin: Skin is warm and dry. She is not diaphoretic.  Psychiatric: She has a normal mood and affect. Her behavior is normal.     Assessment/Plan Small bowel obstruction, likely due to adhesions in RLQ  Admit to general surgery service  NPO, IV hydration  Will hold off on NG tube for now - will place if nausea and emesis progress  AXR in AM 9/10  Encouraged ambulation  I discussed the etiology of small bowel obstruction with the patient and her husband.  I discussed the treatment plan of bowel rest, intravenous  hydration, and careful monitoring.  We will plan to repeat an abdominal x-ray in the morning.  As long as the patient remains clinically stable, we will attempt nonsurgical management for 24 to 48 hours.  If there is no significant improvement, we may consider either laparoscopic or open surgical treatment after that time period.  Patient and her husband agree with this plan.  Armandina Gemma, [facility] Office: [phone]    Earnstine Regal, MD 05/21/2018, 10:33 PM

## 2018-05-21 NOTE — ED Triage Notes (Signed)
Patient c/o mid abdominal bloating and abdominal pain since yesterday. Patient states she began vomiting today. Patient has a history of bowel obstruction.

## 2018-05-21 NOTE — ED Notes (Signed)
ED TO INPATIENT HANDOFF REPORT  Name/Age/Gender Jennifer [name] 57 y.o. female  Code Status    Code Status Orders  (From admission, onward)         Start     Ordered   05/21/18 2241  Full code  Continuous     05/21/18 2243        Code Status History    Date Active Date Inactive Code Status Order ID Comments User Context   05/14/2017 1808 05/17/2017 1326 Full Code [id number]  Jovita Kussmaul, MD ED      Home/SNF/Other Home  Chief Complaint cramping/emesis  Level of Care/Admitting Diagnosis ED Disposition    ED Disposition Condition [facility] Area: [facility] [[id number]]  Level of Care: Med-Surg [16]  Diagnosis: Small bowel obstruction [facility]) [[id number]]  Admitting Physician: CCS, [place]  Attending Physician: CCS, [place]  Bed request comments: 5 west  PT Class (Do Not Modify): Observation [104]  PT Acc Code (Do Not Modify): Observation [10022]       Medical History Past Medical History:  Diagnosis Date  . Bowel obstruction ([place])   . Fibroids   . GERD (gastroesophageal reflux disease)   . H/O small bowel obstruction   . Hyperlipidemia     Allergies Allergies  Allergen Reactions  . Codeine Nausea And Vomiting    IV Location/Drains/Wounds Patient Lines/Drains/Airways Status   Active Line/Drains/Airways    Name:   Placement date:   Placement time:   Site:   Days:   Peripheral IV 05/21/18 Right Antecubital   05/21/18    1840    Antecubital   less than 1          Labs/Imaging Results for orders placed or performed during the hospital encounter of 05/21/18 (from the past 48 hour(s))  Lipase, blood     Status: None   Collection Time: 05/21/18  6:48 PM  Result Value Ref Range   Lipase 28 11 - 51 U/L    Comment: Performed at [facility], [place] [address], [place], [place] [address]  Comprehensive metabolic panel     Status: Abnormal   Collection Time: 05/21/18  6:48 PM  Result Value Ref Range   Sodium  141 135 - 145 mmol/L   Potassium 3.7 3.5 - 5.1 mmol/L   Chloride 100 98 - 111 mmol/L   CO2 24 22 - 32 mmol/L   Glucose, Bld 155 (H) 70 - 99 mg/dL   BUN 13 6 - 20 mg/dL   Creatinine, Ser 0.86 0.44 - 1.00 mg/dL   Calcium 9.9 8.9 - 10.3 mg/dL   Total Protein 7.2 6.5 - 8.1 g/dL   Albumin 4.5 3.5 - 5.0 g/dL   AST 18 15 - 41 U/L   ALT 15 0 - 44 U/L   Alkaline Phosphatase 50 38 - 126 U/L   Total Bilirubin 1.1 0.3 - 1.2 mg/dL   GFR calc non Af Amer >60 >60 mL/min   GFR calc Af Amer >60 >60 mL/min    Comment: (NOTE) The eGFR has been calculated using the CKD EPI equation. This calculation has not been validated in all clinical situations. eGFR's persistently <60 mL/min signify possible Chronic Kidney Disease.    Anion gap 17 (H) 5 - 15    Comment: Performed at [facility], [place] [address], [place], [place] [address]  CBC     Status: Abnormal   Collection Time: 05/21/18  6:48 PM  Result Value Ref Range   WBC 15.5 (H) 4.0 - 10.5 K/uL   RBC 5.38 (H) 3.87 - 5.11 MIL/uL   Hemoglobin 15.0 12.0 - 15.0 g/dL   HCT 45.3 36.0 - 46.0 %   MCV 84.2 78.0 - 100.0 fL   MCH 27.9 26.0 - 34.0 pg   MCHC 33.1 30.0 - 36.0 g/dL   RDW 14.0 11.5 - 15.5 %   Platelets 342 150 - 400 K/uL    Comment: Performed at [facility], [place] [address], [place], [place] [address]   Ct Abdomen Pelvis W Contrast  Result Date: 05/21/2018 CLINICAL DATA:  Abdominal bloating EXAM: CT ABDOMEN AND PELVIS WITH CONTRAST TECHNIQUE: Multidetector CT imaging of the abdomen and pelvis was performed using the standard protocol following bolus administration of intravenous contrast. CONTRAST:  140m ISOVUE-300 IOPAMIDOL (ISOVUE-300) INJECTION 61% COMPARISON:  05/14/2017 FINDINGS: Lower chest: Minimal dependent atelectasis Hepatobiliary: Diffuse hepatic steatosis. Tiny hypodensity in the right lobe of the liver is stable. Gallbladder is within normal limits. Pancreas: Unremarkable Spleen: Unremarkable  Adrenals/Urinary Tract: Simple cyst in the right kidney. Left kidney and adrenal glands are within normal limits. Bladder is decompressed. Stomach/Bowel: Proximal small bowel is distended. Distal small bowel is decompressed. There is a transition point in the right lower quadrant. See image 59 of series 2. There is no obvious cause for the obstruction. It is most likely due to adhesions. Colon is decompressed. There is no obvious mass in the colon. Appendix is not clearly visualized. Vascular/Lymphatic: Minimal atherosclerotic calcification of the aorta. No abnormal retroperitoneal adenopathy. Reproductive: Uterus is absent.  Adnexa are within normal limits. Other: Scattered free fluid is present about the liver, spleen, and in the pelvis. There is no free intraperitoneal gas to suggest perforation. Musculoskeletal: No acute fracture. IMPRESSION: Small bowel obstruction. The transition point is in the right lower quadrant. It is likely due to adhesions. Ascites. Aortic Atherosclerosis (ICD10-I70.0). Electronically Signed   By: AMarybelle KillingsM.D.   On: 05/21/2018 20:28    Pending Labs Unresulted Labs (From admission, onward)    Start     Ordered   05/21/18 2241  HIV antibody (Routine Testing)  Once,   R     05/21/18 2243   05/21/18 1747  Urinalysis, Routine w reflex microscopic  STAT,   STAT     05/21/18 1746          Vitals/Pain Today's Vitals   05/21/18 1841 05/21/18 1941 05/21/18 2000 05/21/18 2111  BP:  116/65 109/78   Pulse:  73 76   Resp:  16 16   Temp:    (S) 99.1 F (37.3 C)  TempSrc:    Oral  SpO2:  98% 97%   Weight:      Height:      PainSc: 7    5     Isolation Precautions No active isolations  Medications Medications  iopamidol (ISOVUE-300) 61 % injection (has no administration in time range)  dextrose 5 % and 0.45 % NaCl with KCl 20 mEq/L infusion (has no administration in time range)  acetaminophen (TYLENOL) tablet 650 mg (has no administration in time range)    Or   acetaminophen (TYLENOL) suppository 650 mg (has no administration in time range)  HYDROmorphone (DILAUDID) injection 1-2 mg (has no administration in time range)  ondansetron (ZOFRAN-ODT) disintegrating tablet 4 mg (has no administration in time range)    Or  ondansetron (ZOFRAN) injection 4 mg (has no administration in time range)  HYDROmorphone (  DILAUDID) injection 1 mg (1 mg Intravenous Given 05/21/18 1845)  sodium chloride 0.9 % bolus 1,000 mL (0 mLs Intravenous Stopped 05/21/18 1952)  ondansetron (ZOFRAN) injection 4 mg (4 mg Intravenous Given 05/21/18 1843)  iopamidol (ISOVUE-300) 61 % injection 100 mL (100 mLs Intravenous Contrast Given 05/21/18 2010)  HYDROmorphone (DILAUDID) injection 1 mg (1 mg Intravenous Given 05/21/18 2116)  0.9 %  sodium chloride infusion ( Intravenous New Bag/Given 05/21/18 2116)    Mobility walks

## 2018-05-21 NOTE — ED Notes (Signed)
First attempt to call for report. Will attempt to call back in a few minutes.

## 2018-05-21 NOTE — ED Provider Notes (Signed)
[facility] DEPT Provider Note   CSN: [id number] Arrival date & time: 05/21/18  1701     History   Chief Complaint Chief Complaint  Patient presents with  . Abdominal Pain  . Emesis    HPI Jennifer [name] is a 57 y.o. female with history of small bowel obstruction who presents with a 1 day history of abdominal pain, nausea, vomiting, belching.  Patient reports her symptoms feel exactly the same as her last bowel obstruction.  She reports she did have a soft, small bowel movement this morning, which she states was strange.  She denies any fevers at home.  She has history of requiring abdominal surgery for bowel obstruction, but was admitted a year ago and had an NG tube.  Patient denies any chest pain, shortness of breath, urinary symptoms.  Patient has been unable to keep down any medications at home.  HPI  Past Medical History:  Diagnosis Date  . Bowel obstruction ([place])   . Fibroids   . GERD (gastroesophageal reflux disease)   . H/O small bowel obstruction   . Hyperlipidemia     Patient Active Problem List   Diagnosis Date Noted  . 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 SBO (small bowel obstruction) ([place]) 05/14/2017  . GERD (gastroesophageal reflux disease) 03/04/2017    Past Surgical History:  Procedure Laterality Date  . ABDOMINAL HYSTERECTOMY    . ROTATOR CUFF REPAIR Right 2017     OB History   None      Home Medications    Prior to Admission medications   Medication Sig Start Date End Date Taking? Authorizing Provider  Cholecalciferol (VITAMIN D-3) 1000 units CAPS Take 1 capsule by mouth 3 (three) times a week.    Yes [provider]  ferrous sulfate 325 (65 FE) MG tablet Take 325 mg by mouth 3 (three) times a week.    Yes [provider]  ibuprofen (ADVIL,MOTRIN) 200 MG tablet Take 400 mg by mouth every 6 (six) hours as needed for mild pain.   Yes [provider]  NONFORMULARY OR  COMPOUNDED ITEM Apply 3 drops topically every morning.    Yes [provider]  PRESCRIPTION MEDICATION Apply 5 drops topically at bedtime.   Yes [provider]  progesterone (PROMETRIUM) 100 MG capsule Take 1 capsule (100 mg total) by mouth daily. 06/23/17  Yes Elby Beck, FNP  ranitidine (ZANTAC) 150 MG tablet Take 1 tablet (150 mg total) by mouth 2 (two) times daily. 11/08/17  Yes Elby Beck, FNP  Diclofenac Sodium (PENNSAID) 2 % SOLN Place 2 g onto the skin 2 (two) times daily. Patient not taking: Reported on 05/21/2018 08/01/17   Lyndal Pulley, DO    Family History Family History  Problem Relation Age of Onset  . Cancer Mother   . Early death Mother   . Diabetes Father   . Hyperlipidemia Father   . Heart disease Father   . Cancer Maternal Grandmother   . Cancer Paternal Grandmother     Social History Social History   Tobacco Use  . Smoking status: Never Smoker  . Smokeless tobacco: Never Used  Substance Use Topics  . Alcohol use: Yes    Alcohol/week: 2.0 standard drinks    Types: 2 Glasses of wine per week  . Drug use: No     Allergies   Codeine   Review of Systems Review of Systems  Constitutional: Negative for chills and fever.  HENT: Negative for facial swelling and sore throat.   Respiratory: Negative for shortness of breath.   Cardiovascular: Negative for chest pain.  Gastrointestinal: Positive for abdominal pain, nausea and vomiting. Negative for blood in stool and diarrhea.  Genitourinary: Negative for dysuria.  Musculoskeletal: Negative for back pain.  Skin: Negative for rash and wound.  Neurological: Negative for headaches.  Psychiatric/Behavioral: The patient is not nervous/anxious.      Physical Exam Updated Vital Signs BP 109/78   Pulse 76   Temp (S) 99.1 F (37.3 C) (Oral)   Resp 16   Ht 5\' 4"  (1.626 m)   Wt 54.4 kg   SpO2 97%   BMI 20.60 kg/m   Physical Exam  Constitutional: She appears  well-developed and well-nourished. No distress.  HENT:  Head: Normocephalic and atraumatic.  Mouth/Throat: Oropharynx is clear and moist. No oropharyngeal exudate.  Eyes: Pupils are equal, round, and reactive to light. Conjunctivae are normal. Right eye exhibits no discharge. Left eye exhibits no discharge. No scleral icterus.  Neck: Normal range of motion. Neck supple. No thyromegaly present.  Cardiovascular: Normal rate, regular rhythm, normal heart sounds and intact distal pulses. Exam reveals no gallop and no friction rub.  No murmur heard. Pulmonary/Chest: Effort normal and breath sounds normal. No stridor. No respiratory distress. She has no wheezes. She has no rales.  Abdominal: Soft. Bowel sounds are normal. She exhibits no distension. There is generalized tenderness and tenderness in the epigastric area and periumbilical area. There is guarding. There is no rigidity and no rebound.  Musculoskeletal: She exhibits no edema.  Lymphadenopathy:    She has no cervical adenopathy.  Neurological: She is alert. Coordination normal.  Skin: Skin is warm and dry. No rash noted. She is not diaphoretic. No pallor.  Psychiatric: She has a normal mood and affect.  Nursing note and vitals reviewed.    ED Treatments / Results  Labs (all labs ordered are listed, but only abnormal results are displayed) Labs Reviewed  COMPREHENSIVE METABOLIC PANEL - Abnormal; Notable for the following components:      Result Value   Glucose, Bld 155 (*)    Anion gap 17 (*)    All other components within normal limits  CBC - Abnormal; Notable for the following components:   WBC 15.5 (*)    RBC 5.38 (*)    All other components within normal limits  LIPASE, BLOOD  URINALYSIS, ROUTINE W REFLEX MICROSCOPIC    EKG None  Radiology Ct Abdomen Pelvis W Contrast  Result Date: 05/21/2018 CLINICAL DATA:  Abdominal bloating EXAM: CT ABDOMEN AND PELVIS WITH CONTRAST TECHNIQUE: Multidetector CT imaging of the abdomen  and pelvis was performed using the standard protocol following bolus administration of intravenous contrast. CONTRAST:  139mL ISOVUE-300 IOPAMIDOL (ISOVUE-300) INJECTION 61% COMPARISON:  05/14/2017 FINDINGS: Lower chest: Minimal dependent atelectasis Hepatobiliary: Diffuse hepatic steatosis. Tiny hypodensity in the right lobe of the liver is stable. Gallbladder is within normal limits. Pancreas: Unremarkable Spleen: Unremarkable Adrenals/Urinary Tract: Simple cyst in the right kidney. Left kidney and adrenal glands are within normal limits. Bladder is decompressed. Stomach/Bowel: Proximal small bowel is distended. Distal small bowel is decompressed. There is a transition point in the right lower quadrant. See image 59 of series 2. There is no obvious cause for the obstruction. It is most likely due to adhesions. Colon is decompressed. There is no obvious mass in the colon. Appendix is not clearly visualized. Vascular/Lymphatic: Minimal atherosclerotic calcification of the aorta. No abnormal retroperitoneal adenopathy.  Reproductive: Uterus is absent.  Adnexa are within normal limits. Other: Scattered free fluid is present about the liver, spleen, and in the pelvis. There is no free intraperitoneal gas to suggest perforation. Musculoskeletal: No acute fracture. IMPRESSION: Small bowel obstruction. The transition point is in the right lower quadrant. It is likely due to adhesions. Ascites. Aortic Atherosclerosis (ICD10-I70.0). Electronically Signed   By: Marybelle Killings M.D.   On: 05/21/2018 20:28    Procedures Procedures (including critical care time)  Medications Ordered in ED Medications  iopamidol (ISOVUE-300) 61 % injection (has no administration in time range)  HYDROmorphone (DILAUDID) injection 1 mg (1 mg Intravenous Given 05/21/18 1845)  sodium chloride 0.9 % bolus 1,000 mL (0 mLs Intravenous Stopped 05/21/18 1952)  ondansetron (ZOFRAN) injection 4 mg (4 mg Intravenous Given 05/21/18 1843)  iopamidol  (ISOVUE-300) 61 % injection 100 mL (100 mLs Intravenous Contrast Given 05/21/18 2010)  HYDROmorphone (DILAUDID) injection 1 mg (1 mg Intravenous Given 05/21/18 2116)  0.9 %  sodium chloride infusion ( Intravenous New Bag/Given 05/21/18 2116)     Initial Impression / Assessment and Plan / ED Course  I have reviewed the triage vital signs and the nursing notes.  Pertinent labs & imaging results that were available during my care of the patient were reviewed by me and considered in my medical decision making (see chart for details).     Patient presenting with small bowel obstruction.  Leukocytosis 15.5, anion gap 17, glucose 155.  Otherwise labs unremarkable.  CT of the pelvis confirms small bowel obstruction with transition point in the right lower quadrant, likely due to adhesions.  Patient is afebrile with stable vitals.  Pain controlled with Dilaudid in the ED.  Fluids given.  Zofran controlled nausea and emesis.  I spoke with general surgeon, Dr. [name], who will admit the patient for further management.  Patient understands and agrees with plan.  Final Clinical Impressions(s) / ED Diagnoses   Final diagnoses:  SBO (small bowel obstruction) [facility])    ED Discharge Orders    None       Frederica Kuster, PA-C 05/21/18 2128    Maudie Flakes, MD 05/22/18 [phone]

## 2018-05-22 ENCOUNTER — Encounter (HOSPITAL_COMMUNITY): Payer: Self-pay

## 2018-05-22 ENCOUNTER — Inpatient Hospital Stay (HOSPITAL_COMMUNITY): Payer: 59

## 2018-05-22 ENCOUNTER — Observation Stay (HOSPITAL_COMMUNITY): Payer: 59

## 2018-05-22 DIAGNOSIS — R112 Nausea with vomiting, unspecified: Secondary | ICD-10-CM | POA: Diagnosis present

## 2018-05-22 DIAGNOSIS — K219 Gastro-esophageal reflux disease without esophagitis: Secondary | ICD-10-CM | POA: Diagnosis present

## 2018-05-22 DIAGNOSIS — K5651 Intestinal adhesions [bands], with partial obstruction: Secondary | ICD-10-CM | POA: Diagnosis not present

## 2018-05-22 DIAGNOSIS — K565 Intestinal adhesions [bands], unspecified as to partial versus complete obstruction: Secondary | ICD-10-CM | POA: Diagnosis present

## 2018-05-22 DIAGNOSIS — E785 Hyperlipidemia, unspecified: Secondary | ICD-10-CM | POA: Diagnosis present

## 2018-05-22 DIAGNOSIS — K56609 Unspecified intestinal obstruction, unspecified as to partial versus complete obstruction: Secondary | ICD-10-CM | POA: Diagnosis not present

## 2018-05-22 DIAGNOSIS — Z4682 Encounter for fitting and adjustment of non-vascular catheter: Secondary | ICD-10-CM | POA: Diagnosis not present

## 2018-05-22 DIAGNOSIS — Z90710 Acquired absence of both cervix and uterus: Secondary | ICD-10-CM | POA: Diagnosis not present

## 2018-05-22 LAB — URINALYSIS, ROUTINE W REFLEX MICROSCOPIC
BILIRUBIN URINE: NEGATIVE
GLUCOSE, UA: NEGATIVE mg/dL
HGB URINE DIPSTICK: NEGATIVE
Ketones, ur: 80 mg/dL — AB
LEUKOCYTES UA: NEGATIVE
NITRITE: NEGATIVE
Protein, ur: 30 mg/dL — AB
Specific Gravity, Urine: >1.046 — ABNORMAL HIGH (ref 1.005–1.030)
pH: 5 (ref 5.0–8.0)

## 2018-05-22 LAB — HIV ANTIBODY (ROUTINE TESTING W REFLEX): HIV SCREEN 4TH GENERATION: NONREACTIVE

## 2018-05-22 MED ORDER — ACETAMINOPHEN 10 MG/ML IV SOLN
1000.0000 mg | Freq: Four times a day (QID) | INTRAVENOUS | Status: AC
  Administered 2018-05-22 – 2018-05-23 (×4): 1000 mg via INTRAVENOUS
  Filled 2018-05-22 (×4): qty 100

## 2018-05-22 MED ORDER — DIPHENHYDRAMINE HCL 50 MG/ML IJ SOLN
25.0000 mg | Freq: Four times a day (QID) | INTRAMUSCULAR | Status: DC | PRN
  Administered 2018-05-22: 25 mg via INTRAVENOUS
  Filled 2018-05-22: qty 1

## 2018-05-22 MED ORDER — DIATRIZOATE MEGLUMINE & SODIUM 66-10 % PO SOLN
90.0000 mL | Freq: Once | ORAL | Status: AC
  Administered 2018-05-22: 90 mL via NASOGASTRIC
  Filled 2018-05-22: qty 90

## 2018-05-22 MED ORDER — PHENOL 1.4 % MT LIQD
1.0000 | OROMUCOSAL | Status: DC | PRN
  Administered 2018-05-22: 1 via OROMUCOSAL
  Filled 2018-05-22: qty 177

## 2018-05-22 NOTE — Progress Notes (Signed)
NG placed by Daisy Floro, RN and myself at bedside. Placement checked with ausculation by Michelle,RN.  Received 560mL of green bile in return when placed to low intermittent suction.

## 2018-05-22 NOTE — Progress Notes (Signed)
NG tube placed to left nostril. Pt tolerated procedure well.

## 2018-05-22 NOTE — Progress Notes (Signed)
SBO (small bowel obstruction) (HCC)  Subjective: Pain and bloating slightly better  Objective: Vital signs in last 24 hours: Temp:  [98.7 F (37.1 C)-99.1 F (37.3 C)] 98.7 F (37.1 C) (09/10 0352) Pulse Rate:  [73-93] 76 (09/10 0352) Resp:  [16-18] 17 (09/10 0352) BP: (109-147)/(65-128) 114/65 (09/10 0352) SpO2:  [97 %-100 %] 98 % (09/10 0352) Weight:  [54.4 kg] 54.4 kg (09/09 1743) Last BM Date: 05/21/18  Intake/Output from previous day: 09/09 0701 - 09/10 0700 In: 1585.5 [I.V.:585.5; IV Piggyback:1000] Out: 300 [Urine:300] Intake/Output this shift: No intake/output data recorded.  General appearance: alert and cooperative GI: soft, mildly distended, min generalized ttp  Lab Results:  Results for orders placed or performed during the hospital encounter of 05/21/18 (from the past 24 hour(s))  Lipase, blood     Status: None   Collection Time: 05/21/18  6:48 PM  Result Value Ref Range   Lipase 28 11 - 51 U/L  Comprehensive metabolic panel     Status: Abnormal   Collection Time: 05/21/18  6:48 PM  Result Value Ref Range   Sodium 141 135 - 145 mmol/L   Potassium 3.7 3.5 - 5.1 mmol/L   Chloride 100 98 - 111 mmol/L   CO2 24 22 - 32 mmol/L   Glucose, Bld 155 (H) 70 - 99 mg/dL   BUN 13 6 - 20 mg/dL   Creatinine, Ser 0.86 0.44 - 1.00 mg/dL   Calcium 9.9 8.9 - 10.3 mg/dL   Total Protein 7.2 6.5 - 8.1 g/dL   Albumin 4.5 3.5 - 5.0 g/dL   AST 18 15 - 41 U/L   ALT 15 0 - 44 U/L   Alkaline Phosphatase 50 38 - 126 U/L   Total Bilirubin 1.1 0.3 - 1.2 mg/dL   GFR calc non Af Amer >60 >60 mL/min   GFR calc Af Amer >60 >60 mL/min   Anion gap 17 (H) 5 - 15  CBC     Status: Abnormal   Collection Time: 05/21/18  6:48 PM  Result Value Ref Range   WBC 15.5 (H) 4.0 - 10.5 K/uL   RBC 5.38 (H) 3.87 - 5.11 MIL/uL   Hemoglobin 15.0 12.0 - 15.0 g/dL   HCT 45.3 36.0 - 46.0 %   MCV 84.2 78.0 - 100.0 fL   MCH 27.9 26.0 - 34.0 pg   MCHC 33.1 30.0 - 36.0 g/dL   RDW 14.0 11.5 - 15.5 %   Platelets 342 150 - 400 K/uL     Studies/Results Radiology     MEDS, Scheduled    Assessment: SBO (small bowel obstruction) ([place])   Plan: Will get repeat AXR this AM.  If it is worse, we will place an NG Cont NPO, IVF's   LOS: 0 days    Rosario Adie, MD [facility], [place] [phone]   05/22/2018 8:39 AM

## 2018-05-22 NOTE — Plan of Care (Signed)

## 2018-05-23 ENCOUNTER — Inpatient Hospital Stay (HOSPITAL_COMMUNITY): Payer: 59

## 2018-05-23 MED ORDER — FENTANYL CITRATE (PF) 100 MCG/2ML IJ SOLN: 25.0000 ug | INTRAMUSCULAR | Status: DC | PRN

## 2018-05-23 MED ORDER — HEPARIN SODIUM (PORCINE) 5000 UNIT/ML IJ SOLN
5000.0000 [IU] | Freq: Three times a day (TID) | INTRAMUSCULAR | Status: DC
  Administered 2018-05-23 – 2018-05-24 (×3): 5000 [IU] via SUBCUTANEOUS
  Filled 2018-05-23 (×4): qty 1

## 2018-05-23 MED ORDER — ACETAMINOPHEN 650 MG RE SUPP: 650.0000 mg | Freq: Four times a day (QID) | RECTAL | Status: DC | PRN

## 2018-05-23 MED ORDER — KETOROLAC TROMETHAMINE 30 MG/ML IJ SOLN
15.0000 mg | Freq: Four times a day (QID) | INTRAMUSCULAR | Status: DC | PRN
  Administered 2018-05-23 (×2): 15 mg via INTRAVENOUS
  Filled 2018-05-23 (×2): qty 1

## 2018-05-23 MED ORDER — ACETAMINOPHEN 325 MG PO TABS: 650.0000 mg | ORAL_TABLET | Freq: Four times a day (QID) | ORAL | Status: DC | PRN

## 2018-05-23 NOTE — Progress Notes (Addendum)
[facility] Progress Note     Subjective: CC:  C/o throat irritation from NG tube. Abdominal pain improved. Feels a little bloated today. Small amt flatus, denies BM. Reports itching with dilaudid but no releif with IV tylenol alone.  Objective: Vital signs in last 24 hours: Temp:  [98.9 F (37.2 C)-99.6 F (37.6 C)] 98.9 F (37.2 C) (09/11 0601) Pulse Rate:  [67-74] 72 (09/11 0601) Resp:  [16-18] 16 (09/11 0601) BP: (106-121)/(66-76) 121/76 (09/11 0601) SpO2:  [97 %-99 %] 97 % (09/11 0601) Last BM Date: 05/21/18  Intake/Output from previous day: 09/10 0701 - 09/11 0700 In: 2171.3 [I.V.:1971.3; IV Piggyback:200] Out: 2600 [Urine:1500; Emesis/NG output:1100] Intake/Output this shift: Total I/O In: 0  Out: 425 [Urine:200; Emesis/NG output:225]  PE: Gen:  Alert, NAD, pleasant Card:  Regular rate and rhythm, pedal pulses 2+ BL  Pulm:  Normal effort, clear to auscultation bilaterally Abd: Soft, mild RLQ tenderness without peritonitis, hypoactive BS  Skin: warm and dry, no rashes  Psych: A&Ox3   Lab Results:  Recent Labs    05/21/18 1848  WBC 15.5*  HGB 15.0  HCT 45.3  PLT 342   BMET Recent Labs    05/21/18 1848  NA 141  K 3.7  CL 100  CO2 24  GLUCOSE 155*  BUN 13  CREATININE 0.86  CALCIUM 9.9   PT/INR No results for input(s): LABPROT, INR in the last 72 hours. CMP     Component Value Date/Time   NA 141 05/21/2018 1848   K 3.7 05/21/2018 1848   CL 100 05/21/2018 1848   CO2 24 05/21/2018 1848   GLUCOSE 155 (H) 05/21/2018 1848   BUN 13 05/21/2018 1848   CREATININE 0.86 05/21/2018 1848   CALCIUM 9.9 05/21/2018 1848   PROT 7.2 05/21/2018 1848   ALBUMIN 4.5 05/21/2018 1848   AST 18 05/21/2018 1848   ALT 15 05/21/2018 1848   ALKPHOS 50 05/21/2018 1848   BILITOT 1.1 05/21/2018 1848   GFRNONAA >60 05/21/2018 1848   GFRAA >60 05/21/2018 1848   Lipase     Component Value Date/Time   LIPASE 28 05/21/2018 1848        Studies/Results: Ct Abdomen Pelvis W Contrast  Result Date: 05/21/2018 CLINICAL DATA:  Abdominal bloating EXAM: CT ABDOMEN AND PELVIS WITH CONTRAST TECHNIQUE: Multidetector CT imaging of the abdomen and pelvis was performed using the standard protocol following bolus administration of intravenous contrast. CONTRAST:  168mL ISOVUE-300 IOPAMIDOL (ISOVUE-300) INJECTION 61% COMPARISON:  05/14/2017 FINDINGS: Lower chest: Minimal dependent atelectasis Hepatobiliary: Diffuse hepatic steatosis. Tiny hypodensity in the right lobe of the liver is stable. Gallbladder is within normal limits. Pancreas: Unremarkable Spleen: Unremarkable Adrenals/Urinary Tract: Simple cyst in the right kidney. Left kidney and adrenal glands are within normal limits. Bladder is decompressed. Stomach/Bowel: Proximal small bowel is distended. Distal small bowel is decompressed. There is a transition point in the right lower quadrant. See image 59 of series 2. There is no obvious cause for the obstruction. It is most likely due to adhesions. Colon is decompressed. There is no obvious mass in the colon. Appendix is not clearly visualized. Vascular/Lymphatic: Minimal atherosclerotic calcification of the aorta. No abnormal retroperitoneal adenopathy. Reproductive: Uterus is absent.  Adnexa are within normal limits. Other: Scattered free fluid is present about the liver, spleen, and in the pelvis. There is no free intraperitoneal gas to suggest perforation. Musculoskeletal: No acute fracture. IMPRESSION: Small bowel obstruction. The transition point is in the right lower quadrant. It is likely  due to adhesions. Ascites. Aortic Atherosclerosis (ICD10-I70.0). Electronically Signed   By: Marybelle Killings M.D.   On: 05/21/2018 20:28   Dg Abd 2 Views  Result Date: 05/22/2018 CLINICAL DATA:  Nausea, small-bowel obstruction. EXAM: ABDOMEN - 2 VIEW COMPARISON:  Abdominal and pelvic CT scan of May 21, 2018 FINDINGS: There remain loops of  moderately distended gas and fluid-filled small bowel in the mid abdomen. No free extraluminal gas collections are observed. There is no significant colonic gas. There is air in fluid within the stomach. There is contrast within the urinary bladder from yesterday's CT scan. IMPRESSION: Persistent mid to distal small bowel obstruction. No evidence of perforation. Electronically Signed   By: David  Martinique M.D.   On: 05/22/2018 10:01   Dg Abd Portable 1v-small Bowel Obstruction Protocol-initial, 8 Hr Delay  Result Date: 05/23/2018 CLINICAL DATA:  Small-bowel protocol 8 hours post, small-bowel obstruction EXAM: PORTABLE ABDOMEN - 1 VIEW COMPARISON:  Earlier exam 05/22/2018 FINDINGS: Residual contrast within urinary bladder. Nasogastric tube tip projects over proximal stomach. Small amount of residual contrast is seen within stomach and within bowel in the mid abdomen. No additional retained contrast identified. Small bowel distention seen on the previous exam improved. Bones demineralized. IMPRESSION: Decreased small bowel distention. Minimal residual contrast within stomach and bowel. Electronically Signed   By: Lavonia Dana M.D.   On: 05/23/2018 01:56   Dg Abd Portable 1v-small Bowel Protocol-position Verification  Result Date: 05/22/2018 CLINICAL DATA:  NG tube placement EXAM: PORTABLE ABDOMEN - 1 VIEW COMPARISON:  05/22/2018 930 hours FINDINGS: NG tube tip is in the fundus of the stomach. Distended small bowel loops are stable. Excreted contrast in the bladder is noted. IMPRESSION: NG tube tip is in the fundus of the stomach. Persistent dilatation of small bowel loops in an obstruction pattern. Electronically Signed   By: Marybelle Killings M.D.   On: 05/22/2018 15:04   Anti-infectives: Anti-infectives (From admission, onward)   None     Assessment/Plan Recurrent SBO - hx mult abd surgeries for endometriosis, ex lap for SBO - small amt flatus this morning, NG with over 1100 cc/24h light brown  - SB  protocol 9/10, contrast is in the small bowel, repeat film later today (24h delay) - mobilize/OOB - minimize narcotics as able, add toradol   FEN: NPO, IVF, NG to LIWS  ID: none [id number], SQ heparin   Addendum 1600: follow up AXR looks improved, D/C NG tube, patient may have ice chips. Will re-assess in the AM and possibly advance diet.    LOS: 1 day    Obie Dredge, [facility] Surgery Pager: [phone]

## 2018-05-24 LAB — BASIC METABOLIC PANEL
Anion gap: 6 (ref 5–15)
BUN: 6 mg/dL (ref 6–20)
CHLORIDE: 107 mmol/L (ref 98–111)
CO2: 29 mmol/L (ref 22–32)
Calcium: 8.4 mg/dL — ABNORMAL LOW (ref 8.9–10.3)
Creatinine, Ser: 0.6 mg/dL (ref 0.44–1.00)
GFR calc Af Amer: >60 mL/min (ref >60)
GFR calc non Af Amer: >60 mL/min (ref >60)
GLUCOSE: 110 mg/dL — AB (ref 70–99)
POTASSIUM: 3.6 mmol/L (ref 3.5–5.1)
Sodium: 142 mmol/L (ref 135–145)

## 2018-05-24 MED ORDER — ACETAMINOPHEN 325 MG PO TABS: 650.0000 mg | ORAL_TABLET | Freq: Four times a day (QID) | ORAL | Status: DC | PRN

## 2018-05-24 NOTE — Consult Note (Signed)
   [facility] CM Inpatient Consult   05/24/2018  Jennifer [name] [birth year] [id number]   Spoke with [name] at bedside on behalf of [place] [employer] program for [facility] employees/dependents with [facility] insurance.   Discussed that she will receive post hospital discharge call. Provided [employer]ML[employer] contact information.   Left 24-hr nurse advice line magnet and contact information.   Appreciative of visit.    Marthenia Rolling, MSN-Ed, RN,BSN [facility] Liaison [phone]

## 2018-05-24 NOTE — Progress Notes (Signed)
[facility] Progress Note     Subjective: CC:  Feels much better. Pain improving. +flatus and BM. Denies nausea or vomiting.   Objective: Vital signs in last 24 hours: Temp:  [97.7 F (36.5 C)-99.5 F (37.5 C)] 97.7 F (36.5 C) (09/12 0620) Pulse Rate:  [65-74] 65 (09/12 0620) Resp:  [16-17] 17 (09/12 0620) BP: (97-120)/(63-65) 120/64 (09/12 0620) SpO2:  [98 %-100 %] 99 % (09/12 0620) Last BM Date: 05/23/18  Intake/Output from previous day: 09/11 0701 - 09/12 0700 In: 2183.1 [P.O.:110; I.V.:1873.1; IV Piggyback:200] Out: 2875 [Urine:2350; Emesis/NG output:525] Intake/Output this shift: No intake/output data recorded.  PE: Gen:  Alert, NAD, pleasant Card:  Regular rate and rhythm, pedal pulses 2+ BL Pulm:  Normal effort, clear to auscultation bilaterally Abd: Soft, mild RLQ tenderness, non-distended, +BS Skin: warm and dry, no rashes  Psych: A&Ox3   Lab Results:  Recent Labs    05/21/18 1848  WBC 15.5*  HGB 15.0  HCT 45.3  PLT 342   BMET Recent Labs    05/21/18 1848 05/24/18 0416  NA 141 142  K 3.7 3.6  CL 100 107  CO2 24 29  GLUCOSE 155* 110*  BUN 13 6  CREATININE 0.86 0.60  CALCIUM 9.9 8.4*   PT/INR No results for input(s): LABPROT, INR in the last 72 hours. CMP     Component Value Date/Time   NA 142 05/24/2018 0416   K 3.6 05/24/2018 0416   CL 107 05/24/2018 0416   CO2 29 05/24/2018 0416   GLUCOSE 110 (H) 05/24/2018 0416   BUN 6 05/24/2018 0416   CREATININE 0.60 05/24/2018 0416   CALCIUM 8.4 (L) 05/24/2018 0416   PROT 7.2 05/21/2018 1848   ALBUMIN 4.5 05/21/2018 1848   AST 18 05/21/2018 1848   ALT 15 05/21/2018 1848   ALKPHOS 50 05/21/2018 1848   BILITOT 1.1 05/21/2018 1848   GFRNONAA >60 05/24/2018 0416   GFRAA >60 05/24/2018 0416   Lipase     Component Value Date/Time   LIPASE 28 05/21/2018 1848       Studies/Results: Dg Abd 1 View  Result Date: 05/23/2018 CLINICAL DATA:  Delayed image for small-bowel  protocol. EXAM: ABDOMEN - 1 VIEW COMPARISON:  Supine abdominal radiograph of today's date at 1:01 hours. FINDINGS: The small amount of barium present within bowel has passed. The small bowel exhibits minimal gaseous distention within the pelvis. The stool and gas pattern within the colon is normal. There are no abnormal soft tissue calcifications. The nasogastric tubes proximal port projects above the GE junction and advancement by 5-10 cm is recommended. IMPRESSION: No residual barium within the bowel. There remain loops of minimally distended gas-filled small bowel in the pelvis. Advancement of the nasogastric tube by 5-10 cm is recommended. Electronically Signed   By: David  Martinique M.D.   On: 05/23/2018 16:00   Dg Abd 2 Views  Result Date: 05/22/2018 CLINICAL DATA:  Nausea, small-bowel obstruction. EXAM: ABDOMEN - 2 VIEW COMPARISON:  Abdominal and pelvic CT scan of May 21, 2018 FINDINGS: There remain loops of moderately distended gas and fluid-filled small bowel in the mid abdomen. No free extraluminal gas collections are observed. There is no significant colonic gas. There is air in fluid within the stomach. There is contrast within the urinary bladder from yesterday's CT scan. IMPRESSION: Persistent mid to distal small bowel obstruction. No evidence of perforation. Electronically Signed   By: David  Martinique M.D.   On: 05/22/2018 10:01   Dg Abd  Portable 1v-small Bowel Obstruction Protocol-initial, 8 Hr Delay  Result Date: 05/23/2018 CLINICAL DATA:  Small-bowel protocol 8 hours post, small-bowel obstruction EXAM: PORTABLE ABDOMEN - 1 VIEW COMPARISON:  Earlier exam 05/22/2018 FINDINGS: Residual contrast within urinary bladder. Nasogastric tube tip projects over proximal stomach. Small amount of residual contrast is seen within stomach and within bowel in the mid abdomen. No additional retained contrast identified. Small bowel distention seen on the previous exam improved. Bones demineralized.  IMPRESSION: Decreased small bowel distention. Minimal residual contrast within stomach and bowel. Electronically Signed   By: Lavonia Dana M.D.   On: 05/23/2018 01:56   Dg Abd Portable 1v-small Bowel Protocol-position Verification  Result Date: 05/22/2018 CLINICAL DATA:  NG tube placement EXAM: PORTABLE ABDOMEN - 1 VIEW COMPARISON:  05/22/2018 930 hours FINDINGS: NG tube tip is in the fundus of the stomach. Distended small bowel loops are stable. Excreted contrast in the bladder is noted. IMPRESSION: NG tube tip is in the fundus of the stomach. Persistent dilatation of small bowel loops in an obstruction pattern. Electronically Signed   By: Marybelle Killings M.D.   On: 05/22/2018 15:04    Anti-infectives: Anti-infectives (From admission, onward)   None       Assessment/Plan SBO - improving and having bowel function - start clears and advance to soft diet as tolerated - Possible PM discharge   LOS: 2 days    Obie Dredge, [facility] Surgery Pager: [phone]

## 2018-05-24 NOTE — Progress Notes (Signed)
Discharge instructions reviewed with patient. All questions answered. Patient wheeled to vehicle with belongings by nurse tech. 

## 2018-05-24 NOTE — Discharge Instructions (Signed)
Low-Fiber Diet °Fiber is found in fruits, vegetables, and whole grains. A low-fiber diet restricts fibrous foods that are not digested in the small intestine. A diet containing about 10-15 grams of fiber per day is considered low fiber. Low-fiber diets may be used to: °· Promote healing and rest the bowel during intestinal flare-ups. °· Prevent blockage of a partially obstructed or narrowed gastrointestinal tract. °· Reduce fecal weight and volume. °· Slow the movement of feces. ° °You may be on a low-fiber diet as a transitional diet following surgery, after an injury (trauma), or because of a short (acute) or lifelong (chronic) illness. Your health care provider will determine the length of time you need to stay on this diet. °What do I need to know about a low-fiber diet? °Always check the fiber content on the packaging's Nutrition Facts label, especially on foods from the grains list. Ask your dietitian if you have questions about specific foods that are related to your condition, especially if the food is not listed below. In general, a low-fiber food will have less than 2 g of fiber. °What foods can I eat? °Grains °All breads and crackers made with white flour. Sweet rolls, doughnuts, waffles, pancakes, French toast, bagels. Pretzels, Melba toast, zwieback. Well-cooked cereals, such as cornmeal, farina, or cream cereals. Dry cereals that do not contain whole grains, fruit, or nuts, such as refined corn, wheat, rice, and oat cereals. Potatoes prepared any way without skins, plain pastas and noodles, refined white rice. Use white flour for baking and making sauces. Use allowed list of grains for casseroles, dumplings, and puddings. °Vegetables °Strained tomato and vegetable juices. Fresh lettuce, cucumber, spinach. Well-cooked (no skin or pulp) or canned vegetables, such as asparagus, bean sprouts, beets, carrots, green beans, mushrooms, potatoes, pumpkin, spinach, yellow squash, tomato sauce/puree, turnips,  yams, and zucchini. Keep servings limited to ½ cup. °Fruits °All fruit juices except prune juice. Cooked or canned fruits without skin and seeds, such as applesauce, apricots, cherries, fruit cocktail, grapefruit, grapes, mandarin oranges, melons, peaches, pears, pineapple, and plums. Fresh fruits without skin, such as apricots, avocados, bananas, melons, pineapple, nectarines, and peaches. Keep servings limited to ½ cup or 1 piece. °Meat and Other Protein Sources °Ground or well-cooked tender beef, ham, veal, lamb, pork, or poultry. Eggs, plain cheese. Fish, oysters, shrimp, lobster, and other seafood. Liver, organ meats. Smooth nut butters. °Dairy °All milk products and alternative dairy substitutes, such as soy, rice, almond, and coconut, not containing added whole nuts, seeds, or added fruit. °Beverages °Decaf coffee, fruit, and vegetable juices or smoothies (small amounts, with no pulp or skins, and with fruits from allowed list), sports drinks, herbal tea. °Condiments °Ketchup, mustard, vinegar, cream sauce, cheese sauce, cocoa powder. Spices in moderation, such as allspice, basil, bay leaves, celery powder or leaves, cinnamon, cumin powder, curry powder, ginger, mace, marjoram, onion or garlic powder, oregano, paprika, parsley flakes, ground pepper, rosemary, sage, savory, tarragon, thyme, and turmeric. °Sweets and Desserts °Plain cakes and cookies, pie made with allowed fruit, pudding, custard, cream pie. Gelatin, fruit, ice, sherbet, frozen ice pops. Ice cream, ice milk without nuts. Plain hard candy, honey, jelly, molasses, syrup, sugar, chocolate syrup, gumdrops, marshmallows. Limit overall sugar intake. °Fats and Oil °Margarine, butter, cream, mayonnaise, salad oils, plain salad dressings made from allowed foods. Choose healthy fats such as olive oil, canola oil, and omega-3 fatty acids (such as found in salmon or tuna) when possible. °Other °Bouillon, broth, or cream soups made from allowed foods. Any    strained soup. Casseroles or mixed dishes made with allowed foods. °The items listed above may not be a complete list of recommended foods or beverages. Contact your dietitian for more options. °What foods are not recommended? °Grains °All whole wheat and whole grain breads and crackers. Multigrains, rye, bran seeds, nuts, or coconut. Cereals containing whole grains, multigrains, bran, coconut, nuts, raisins. Cooked or dry oatmeal, steel-cut oats. Coarse wheat cereals, granola. Cereals advertised as high fiber. Potato skins. Whole grain pasta, wild or brown rice. Popcorn. Coconut flour. Bran, buckwheat, corn bread, multigrains, rye, wheat germ. °Vegetables °Fresh, cooked or canned vegetables, such as artichokes, asparagus, beet greens, broccoli, Brussels sprouts, cabbage, celery, cauliflower, corn, eggplant, kale, legumes or beans, okra, peas, and tomatoes. Avoid large servings of any vegetables, especially raw vegetables. °Fruits °Fresh fruits, such as apples with or without skin, berries, cherries, figs, grapes, grapefruit, guavas, kiwis, mangoes, oranges, papayas, pears, persimmons, pineapple, and pomegranate. Prune juice and juices with pulp, stewed or dried prunes. Dried fruits, dates, raisins. Fruit seeds or skins. Avoid large servings of all fresh fruits. °Meats and Other Protein Sources °Tough, fibrous meats with gristle. Chunky nut butter. Cheese made with seeds, nuts, or other foods not recommended. Nuts, seeds, legumes (beans, including baked beans), dried peas, beans, lentils. °Dairy °Yogurt or cheese that contains nuts, seeds, or added fruit. °Beverages °Fruit juices with high pulp, prune juice. Caffeinated coffee and teas. °Condiments °Coconut, maple syrup, pickles, olives. °Sweets and Desserts °Desserts, cookies, or candies that contain nuts or coconut, chunky peanut butter, dried fruits. Jams, preserves with seeds, marmalade. Large amounts of sugar and sweets. Any other dessert made with fruits from  the not recommended list. °Other °Soups made from vegetables that are not recommended or that contain other foods not recommended. °The items listed above may not be a complete list of foods and beverages to avoid. Contact your dietitian for more information. °This information is not intended to replace advice given to you by your health care provider. Make sure you discuss any questions you have with your health care provider. °Document Released: 02/18/2002 Document Revised: 02/04/2016 Document Reviewed: 07/22/2013 °Elsevier Interactive Patient Education © 2017 Elsevier Inc. ° °

## 2018-05-24 NOTE — Discharge Summary (Signed)
Patient ID: Jennifer [name] [id number] 1960-11-19 57 y.o.  Admit date: 05/21/2018 Discharge date: 05/24/2018  Admitting Diagnosis: SBO  Discharge Diagnosis Patient Active Problem List   Diagnosis Date Noted  . Small bowel obstruction ([name]) 05/21/2018  . 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 SBO (small bowel obstruction) ([name]) 05/14/2017  . GERD (gastroesophageal reflux disease) 03/04/2017    Consultants none  Reason for Admission: Patient is a pleasant 57 year old white female who presented to the emergency department with 24-hour history of generalized abdominal pain, nausea, and emesis.  Patient has a history of small bowel obstructions and was last admitted to our surgical service 1 year ago.  She resolved her obstruction at that time without surgical intervention.  The patient has a history of endometriosis and underwent multiple gynecologic procedures for ablation.  She is also had a hysterectomy.  Patient has required a laparotomy in the past for small bowel obstruction with small bowel resection.  Patient developed generalized abdominal pain which was crampy in nature yesterday.  It improved some this morning.  Patient had a small bowel movement.  She tried to eat some soup and developed severe crampy abdominal pain followed by nausea and vomiting.  She presented to the emergency department.  CT scan abdomen and pelvis shows dilated loops of small bowel with a transition point in the right lower quadrant of the abdomen likely due to adhesions.  General surgery is now called to evaluate and manage.  Procedures none  Hospital Course:  The patient was admitted and initially did not want an NGT placed.  However, the following day her films showed a persistent SBO pattern and she began having emesis.  An NGT was then placed.  Within a day, her films began to normalize and she began to have bowel function.  Her NGT was then removed and her diet was advanced as  tolerates.  On HD3, she was stable for DC home. Low fiber diet at home has been thoroughly discussed with the patient and her husband to help with future problems.  Physical Exam: Please see note from earlier today  Allergies as of 05/24/2018      Reactions   Codeine Nausea And Vomiting      Medication List    STOP taking these medications   Diclofenac Sodium 2 % Soln     TAKE these medications   acetaminophen 325 MG tablet Commonly known as:  TYLENOL Take 2 tablets (650 mg total) by mouth every 6 (six) hours as needed for mild pain (or temp > 100).   ferrous sulfate 325 (65 FE) MG tablet Take 325 mg by mouth 3 (three) times a week.   ibuprofen 200 MG tablet Commonly known as:  ADVIL,MOTRIN Take 400 mg by mouth every 6 (six) hours as needed for mild pain.   NONFORMULARY OR COMPOUNDED ITEM Apply 3 drops topically every morning.   PRESCRIPTION MEDICATION Apply 5 drops topically at bedtime.   progesterone 100 MG capsule Commonly known as:  PROMETRIUM Take 1 capsule (100 mg total) by mouth daily.   ranitidine 150 MG tablet Commonly known as:  ZANTAC Take 1 tablet (150 mg total) by mouth 2 (two) times daily.   Vitamin D-3 1000 units Caps Take 1 capsule by mouth 3 (three) times a week.        Follow-up Information    Surgery, [place] Follow up.   Specialty:  General Surgery Why:  as needed Contact information: [address] [place] [address] [phone]           Signed: Saverio Danker, [facility] 05/24/2018, 4:30 PM Pager: [phone]

## 2018-05-25 ENCOUNTER — Other Ambulatory Visit: Payer: Self-pay | Admitting: *Deleted

## 2018-05-25 ENCOUNTER — Encounter: Payer: Self-pay | Admitting: Family Medicine

## 2018-05-25 NOTE — Patient Outreach (Signed)
[place] [facility]) Care Management  05/25/2018  Jennifer [name] 1961-02-17 [id number]   Subjective: Telephone call to patient's home  / mobile number, spoke with female answering phone, states Jennifer [name] not available, left HIPAA compliant message, and requested call back.    Objective: Per chart review, patient hospitalized 05/21/18 - 05/24/18 for small bowel obstruction.  Patient also has a history of hyperlipidemia and Popliteus tendinitis of right lower extremity. [facility] Care Management completed last transition of care follow up call on 05/18/17.      Assessment: Received UMR Transition of care referral on 05/25/18.  Transition of care follow up pending patient contact.     Plan: RNCM will send unsuccessful outreach  letter, [facility] pamphlet, will call patient for 2nd telephone outreach attempt, transition of care follow up, and proceed with case closure, within 10 business days if no return call.     Jennifer [name] H. Annia Friendly, BSN, [place] Management [facility] Telephonic CM Phone: [phone] Fax: [phone]

## 2018-05-28 ENCOUNTER — Other Ambulatory Visit: Payer: Self-pay | Admitting: *Deleted

## 2018-05-28 NOTE — Patient Outreach (Signed)
[place] [facility]) Care Management  05/28/2018  Jennifer [name] 03/10/1961 [id number]   Subjective: Received voicemail message from Tia Alert, states she is returning call, and requested call back. Telephone call to patient's home  / mobile number, no answer, left HIPAA compliant voicemail message, and requested call back.   Objective:Per chart review, patient hospitalized 05/21/18 - 05/24/18 for small bowel obstruction. Patient also has a history of hyperlipidemia and Popliteus tendinitis of right lower extremity. [facility] Care Management completed last transition of care follow up call on 05/18/17.      Assessment: Received UMR Transition of care referral on 05/25/18.Transition of care follow up pending patient contact.     Plan:RNCM has sent unsuccessful outreach  letter, [facility] pamphlet, will call patient for 3rd telephone outreach attempt, transition of care follow up, and proceed with case closure, within 10 business days if no return call.    Berkley Wrightsman H. Annia Friendly, BSN, [place] Management [facility] Telephonic CM Phone: [phone] Fax: [phone]

## 2018-05-29 ENCOUNTER — Other Ambulatory Visit: Payer: Self-pay | Admitting: *Deleted

## 2018-05-29 DIAGNOSIS — M47816 Spondylosis without myelopathy or radiculopathy, lumbar region: Secondary | ICD-10-CM | POA: Diagnosis not present

## 2018-05-29 NOTE — Patient Outreach (Addendum)
[place] [facility]) Care Management  05/29/2018  Jennifer [name] [name] 25, 1961 [id number]   Subjective: Received voicemail from Tia Alert, states she returning call, is currently at work, can not talk at this time, and she will try to call back at a later time.   States also requested a call back with a message regarding the call origin.     Objective:Per chart review, patient hospitalized9/9/19 - 9/12/19for small bowel obstruction. Patient also has a history of hyperlipidemiaandPopliteus tendinitis of right lower extremity.[facility] Care Management completed last transition of care follow up call on 05/18/17.   Assessment: Received UMR Transition of care referral on9/13/19.Transition of care follow up pending patient contact.    Plan:RNCMhas sentunsuccessful outreach letter, [facility] pamphlet, and will proceed with case closure, within 10 business days if no return call.    Praneeth Bussey H. Annia Friendly, BSN, [place] Management [facility] Telephonic CM Phone: [phone] Fax: [phone]

## 2018-05-29 NOTE — Patient Outreach (Signed)
Jennifer [name]) Care Management  05/29/2018  Jennifer [name] Apr 02, 1961 [id number]   Subjective: Telephone call to patient's home  / mobile number, no answer, left HIPAA compliant voicemail message, and requested call back.   Objective:Per chart review, patient hospitalized9/9/19 - 9/12/19for small bowel obstruction. Patient also has a history of hyperlipidemiaandPopliteus tendinitis of right lower extremity.[facility] Care Management completed last transition of care follow up call on 05/18/17.   Assessment: Received UMR Transition of care referral on9/13/19.Transition of care follow up pending patient contact.    Plan:RNCM has sent unsuccessful outreach letter, [place] [name] pamphlet, and will proceed with case closure, within 10 business days if no return call.    Jennifer [name] H. Jennifer [name], BSN, [place] Management [facility] Telephonic CM Phone: [phone] Fax: [phone]

## 2018-05-30 ENCOUNTER — Encounter: Payer: Self-pay | Admitting: *Deleted

## 2018-05-30 ENCOUNTER — Other Ambulatory Visit: Payer: Self-pay | Admitting: *Deleted

## 2018-05-30 NOTE — Patient Outreach (Signed)
[place] [facility]) Care Management  05/30/2018  Jennifer [name] 1961/06/09 [id number]   Subjective: Telephone call to patient's home / mobile number, spoke with patient, and HIPAA verified.  Discussed [facility] Care Management UMR Transition of care follow up, patient voiced understanding, and is in agreement to follow up.   Patient states she remembers speaking with this RNCM in the past.  Patient states she is doing great, feeling better, appetite improving, tolerating low fiber residue diet without difficulty, bowel obstruction resolved without surgery, and is very blessed.  States she has spoken with gastroenterologist and primary MD's regarding follow up appointment. 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  Patient states she is able to manage self care and has assistance as needed. Patient voices understanding of medical diagnosis and treatment plan.  States she is accessing the following [facility] benefits: outpatient pharmacy, hospital indemnity (not chosen), and will call  [employer] (verbally given contact number for [employer] [phone]) to start family medical leave act (FMLA) process when needed.   Patient states she does not have any education material, transition of care, care coordination, disease management, disease monitoring, transportation, community resource, or pharmacy needs at this time. States she is very appreciative of the follow up and is in agreement to receive [place] Management information.     Objective:Per chart review, patient hospitalized9/9/19 - 9/12/19for small bowel obstruction. Patient also has a history of hyperlipidemiaandPopliteus tendinitis of right lower extremity.[facility] Care Management completed last transition of care follow up call on 05/18/17.    Assessment: Received UMR Transition of care referral on9/13/19.Transition of care follow up completed, no care management needs, and  will proceed with case closure.      Plan:RNCM will send patient successful outreach letter, [facility] pamphlet, and magnet. RNCM will complete case closure due to follow up completed / no care management needs.      Teng Decou H. Annia Friendly, BSN, [place] Management [facility] Telephonic CM Phone: [phone] Fax: [phone]

## 2018-06-04 ENCOUNTER — Encounter: Payer: Self-pay | Admitting: Family Medicine

## 2018-06-04 DIAGNOSIS — G8929 Other chronic pain: Secondary | ICD-10-CM

## 2018-06-04 DIAGNOSIS — M25561 Pain in right knee: Principal | ICD-10-CM

## 2018-06-13 ENCOUNTER — Ambulatory Visit
Admission: RE | Admit: 2018-06-13 | Discharge: 2018-06-13 | Disposition: A | Discharge status: Home / Self Care | Payer: 59 | Source: Ambulatory Visit | Attending: Family Medicine | Admitting: Family Medicine

## 2018-06-13 DIAGNOSIS — M25461 Effusion, right knee: Secondary | ICD-10-CM | POA: Diagnosis not present

## 2018-06-13 DIAGNOSIS — G8929 Other chronic pain: Secondary | ICD-10-CM

## 2018-06-13 DIAGNOSIS — M25561 Pain in right knee: Principal | ICD-10-CM

## 2018-06-17 ENCOUNTER — Encounter: Payer: Self-pay | Admitting: Family Medicine

## 2018-06-18 ENCOUNTER — Encounter: Payer: Self-pay | Admitting: Family Medicine

## 2018-06-19 MED FILL — SHINGRIX 50 MCG SUS: 50 | 1 days supply | Qty: 1 | Fill #0

## 2018-07-09 ENCOUNTER — Other Ambulatory Visit: Payer: Self-pay | Admitting: Family Medicine

## 2018-07-09 DIAGNOSIS — Z1231 Encounter for screening mammogram for malignant neoplasm of breast: Secondary | ICD-10-CM

## 2018-08-01 ENCOUNTER — Other Ambulatory Visit: Payer: Self-pay | Admitting: Family Medicine

## 2018-08-01 MED FILL — PROGESTERONE 100 MG CAPSULE: 100 | 90 days supply | Qty: 90 | Fill #0

## 2018-08-08 ENCOUNTER — Ambulatory Visit: Payer: 59 | Admitting: Podiatry

## 2018-08-17 ENCOUNTER — Ambulatory Visit: Payer: 59

## 2018-08-29 ENCOUNTER — Ambulatory Visit: Payer: 59

## 2018-09-20 ENCOUNTER — Encounter: Payer: Self-pay | Admitting: Family Medicine

## 2018-09-20 ENCOUNTER — Telehealth: Payer: Self-pay | Admitting: Family Medicine

## 2018-09-20 ENCOUNTER — Other Ambulatory Visit: Payer: Self-pay | Admitting: Family Medicine

## 2018-09-20 MED ORDER — FAMOTIDINE 20 MG PO TABS: 20.0000 mg | ORAL_TABLET | Freq: Two times a day (BID) | ORAL | 3 refills | Status: DC

## 2018-09-20 MED FILL — FAMOTIDINE 40 MG TABS: 40 | 90 days supply | Qty: 90 | Fill #0

## 2018-09-20 NOTE — Telephone Encounter (Signed)
Order for famotidine sent to patient's pharmacy.

## 2018-09-20 NOTE — Telephone Encounter (Signed)
Pt is requesting orders to be put in for her fasting labs. She wants to get them done at [facility] on [address]. Her physical is scheduled for 12/28/18.

## 2018-09-20 NOTE — Telephone Encounter (Signed)
I sent patient a mychart message to see if she needs her hormone levels checked at the same time.

## 2018-09-20 NOTE — Telephone Encounter (Signed)
Pt is wondering if [facility] can change send in the prescription for Famotidine instead of using Ranitidine due to the effects it has.

## 2018-09-28 ENCOUNTER — Other Ambulatory Visit: Payer: Self-pay | Admitting: Family Medicine

## 2018-09-28 DIAGNOSIS — Z1231 Encounter for screening mammogram for malignant neoplasm of breast: Secondary | ICD-10-CM

## 2018-09-30 ENCOUNTER — Other Ambulatory Visit: Payer: Self-pay | Admitting: Family Medicine

## 2018-09-30 DIAGNOSIS — Z79890 Hormone replacement therapy (postmenopausal): Principal | ICD-10-CM

## 2018-09-30 DIAGNOSIS — E8940 Asymptomatic postprocedural ovarian failure: Secondary | ICD-10-CM

## 2018-09-30 NOTE — Progress Notes (Signed)
Labs entered for cpe

## 2018-10-05 ENCOUNTER — Ambulatory Visit: Payer: 59

## 2018-10-08 IMAGING — DX DG ABDOMEN 1V
1 series · 1 of 1 positions shown · non-contrast
Comparison: CT 05/14/2017

CLINICAL DATA: NG tube placed

EXAM:
ABDOMEN - 1 VIEW

[abdomen kub]
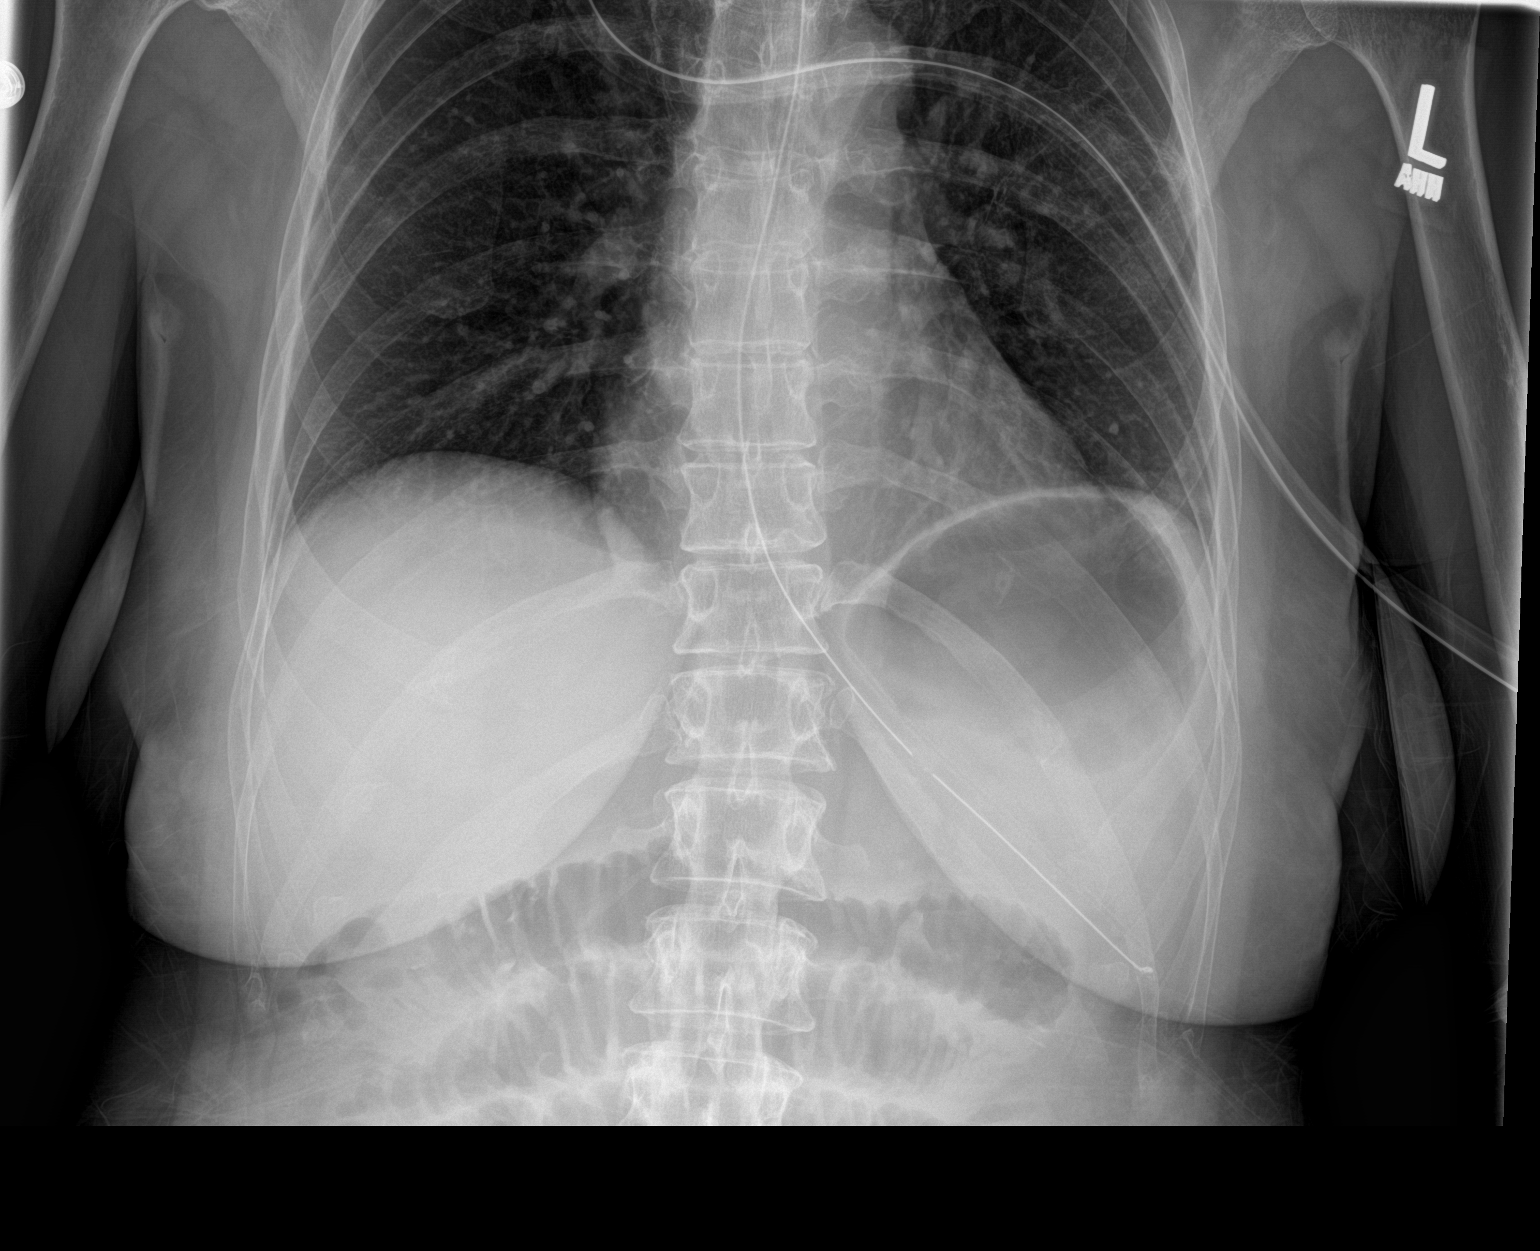

[1 of 1 positions shown; findings below may reference images not displayed]

FINDINGS: NG tube with tip in the gastric body. Side port below the GE
junction. Dilated loops of small bowel measuring 3.3 cm noted. Lung
bases are clear.
IMPRESSION: NG tube in good position.

## 2018-10-19 MED FILL — SHINGRIX 50 MCG SUS: 50 | 1 days supply | Qty: 1 | Fill #1

## 2018-10-22 ENCOUNTER — Encounter: Payer: Self-pay | Admitting: Family Medicine

## 2018-10-26 ENCOUNTER — Ambulatory Visit
Admission: RE | Admit: 2018-10-26 | Discharge: 2018-10-26 | Disposition: A | Discharge status: Home / Self Care | Payer: 59 | Source: Ambulatory Visit

## 2018-10-26 DIAGNOSIS — Z1231 Encounter for screening mammogram for malignant neoplasm of breast: Secondary | ICD-10-CM | POA: Diagnosis not present

## 2018-10-30 MED FILL — PROGESTERONE 100 MG CAPSULE: 100 | 90 days supply | Qty: 90 | Fill #1

## 2018-12-28 ENCOUNTER — Encounter: Payer: 59 | Admitting: Family Medicine

## 2018-12-28 ENCOUNTER — Other Ambulatory Visit: Payer: 59

## 2018-12-28 DIAGNOSIS — Z79890 Hormone replacement therapy (postmenopausal): Secondary | ICD-10-CM | POA: Diagnosis not present

## 2018-12-28 DIAGNOSIS — E8940 Asymptomatic postprocedural ovarian failure: Secondary | ICD-10-CM

## 2018-12-31 LAB — TESTOSTERONE, FREE/TOT EQUILIB
Testosterone, Free Pct: 0.66 % (ref 0.50–2.80)
Testosterone,Free: 0.26 ng/dL (ref 0.10–0.85)
Testosterone: 40 ng/dL (ref 3–41)

## 2019-01-02 ENCOUNTER — Encounter: Payer: Self-pay | Admitting: Family Medicine

## 2019-01-02 LAB — ESTROGENS, TOTAL: Estrogen: 260.7 pg/mL

## 2019-01-02 LAB — PROGESTERONE: Progesterone: 10.1 ng/mL

## 2019-01-04 DIAGNOSIS — N951 Menopausal and female climacteric states: Secondary | ICD-10-CM | POA: Diagnosis not present

## 2019-01-28 DIAGNOSIS — Z90710 Acquired absence of both cervix and uterus: Secondary | ICD-10-CM | POA: Diagnosis not present

## 2019-01-28 DIAGNOSIS — E872 Acidosis: Secondary | ICD-10-CM | POA: Diagnosis not present

## 2019-01-28 DIAGNOSIS — N200 Calculus of kidney: Secondary | ICD-10-CM | POA: Diagnosis not present

## 2019-01-28 DIAGNOSIS — R140 Abdominal distension (gaseous): Secondary | ICD-10-CM | POA: Diagnosis not present

## 2019-01-28 DIAGNOSIS — E876 Hypokalemia: Secondary | ICD-10-CM | POA: Diagnosis not present

## 2019-01-28 DIAGNOSIS — N281 Cyst of kidney, acquired: Secondary | ICD-10-CM | POA: Diagnosis not present

## 2019-01-28 DIAGNOSIS — K56609 Unspecified intestinal obstruction, unspecified as to partial versus complete obstruction: Secondary | ICD-10-CM | POA: Diagnosis not present

## 2019-01-28 DIAGNOSIS — R112 Nausea with vomiting, unspecified: Secondary | ICD-10-CM | POA: Diagnosis not present

## 2019-01-28 DIAGNOSIS — K565 Intestinal adhesions [bands], unspecified as to partial versus complete obstruction: Secondary | ICD-10-CM | POA: Diagnosis not present

## 2019-01-28 DIAGNOSIS — K913 Postprocedural intestinal obstruction, unspecified as to partial versus complete: Secondary | ICD-10-CM | POA: Diagnosis not present

## 2019-01-28 DIAGNOSIS — N809 Endometriosis, unspecified: Secondary | ICD-10-CM | POA: Diagnosis not present

## 2019-01-31 ENCOUNTER — Encounter: Payer: Self-pay | Admitting: *Deleted

## 2019-01-31 ENCOUNTER — Other Ambulatory Visit: Payer: Self-pay | Admitting: *Deleted

## 2019-01-31 NOTE — Patient Outreach (Signed)
[place] [facility]) Care Management  01/31/2019  Jennifer [name] 1961-03-19 [id number]  Transition of care call   Referral received: 01/29/19 Initial outreach: 01/31/19 Insurance: [employer] Health Save Plan   Subjective: Initial successful telephone call to patient's preferred (mobile) number in order to complete transition of care assessment; 2 HIPAA identifiers verified. Explained purpose of call and completed transition of care assessment.  States she is doing better, currently on vacation with her family at [facility]. She says that she has a history of bowel obstructions and so far there is nothing she can do to prevent them since they are related to scar tissue from previous surgeries. She says her abdomen is still slightly distended and sore but she is no longer requiring narcotic analgesics to treat pain. She is maintaining a clear liquid diet until she feels it is safe to advance her diet.   Spouse and other family members are assisting with her recovery.  She says she works for a [employer], [employer] in [place] and he also cares for her bowel issues and she has been in touch with him via text. She said he told her that there was no need for immediate follow up.   Objective:  Jennifer [name] was hospitalized at [facility] from 5/18-5/19/20 with intestinal obstruction. Comorbidities include: GERD and history of small bowel obstruction.   Assessment:  Patient voices good understanding of all discharge instructions.  See transition of care flowsheet for assessment details.   Plan:  Reviewed hospital discharge diagnosis of bowel obstruction and treatment plan using patient's stated hospital discharge instructions, assessing medication adherence and discussing the importance of follow up with primary care provider and specialists. No ongoing care management needs identified so will close case to [place] Management care management services and route  successful outreach letter with [place] Management pamphlet and 24 Hour Nurse Line Magnet to [place] Management clinical pool to be mailed to patient's home address.   Barrington Ellison RN,CCM,CDE [place] Management Coordinator Office Phone [phone] Office Fax [phone]

## 2019-02-07 DIAGNOSIS — M79642 Pain in left hand: Secondary | ICD-10-CM | POA: Insufficient documentation

## 2019-02-07 DIAGNOSIS — M720 Palmar fascial fibromatosis [Dupuytren]: Secondary | ICD-10-CM | POA: Diagnosis not present

## 2019-02-08 ENCOUNTER — Telehealth: Payer: Self-pay | Admitting: Gastroenterology

## 2019-02-08 NOTE — Telephone Encounter (Signed)
I know this patient well from prior hospitalization.  I have reviewed her file and spoken with her about recent events. History as outlined below:  1990 - diagnostic lap for severe endometriosis 1991 - lupron therapy followed by another laporotomy 1993 - 6 months of danocrine followed by laporotomy (adhesions) 1999 - hysterectomy 2008 - SBO - ex lap with lysis of ahesions 2018 - SBO - admission without surgery 2019 - SBO - admission without surgery 2020 - SBO - admission without surgery   She's had 3 admissions in the past 3 years, the last being a few weeks ago. Treated conservatively. I have reviewed her CT images from 2018, 2019, and 2020, all appear to show obstruction with transition point in the RLQ. 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 May also consider MRE pending her course as well. Discussed options with the patient, will refer to Dr. Renelda Mom of [facility] for opinion on recurrent bowel obstructions.   Dottie can you help refer to Dr. Drue Flirt for this patient? Thanks much. Records on my desk that can be sent as part of her referral.

## 2019-02-11 ENCOUNTER — Telehealth: Payer: Self-pay | Admitting: Family Medicine

## 2019-02-11 NOTE — Telephone Encounter (Signed)
Records have been faxed to Dr Nilda Simmer office. We will await an appointment date and time.

## 2019-02-11 NOTE — Telephone Encounter (Signed)
Pt is set for cpe 03/22/19 and would like to have cpe labs drawn at [facility] the week before. She is requesting orders put in prior to the visit.

## 2019-02-13 ENCOUNTER — Other Ambulatory Visit: Payer: Self-pay | Admitting: Family Medicine

## 2019-02-13 DIAGNOSIS — Z13220 Encounter for screening for lipoid disorders: Secondary | ICD-10-CM

## 2019-02-13 DIAGNOSIS — D649 Anemia, unspecified: Secondary | ICD-10-CM

## 2019-02-13 NOTE — Telephone Encounter (Signed)
Please let patient know that lab orders have been placed. Fast for at least 8 hours.

## 2019-02-13 NOTE — Telephone Encounter (Signed)
[name] notified as instructed by telephone.

## 2019-02-16 ENCOUNTER — Other Ambulatory Visit: Payer: Self-pay | Admitting: Family Medicine

## 2019-02-18 ENCOUNTER — Other Ambulatory Visit: Payer: Self-pay | Admitting: *Deleted

## 2019-02-18 MED ORDER — PROGESTERONE MICRONIZED 100 MG PO CAPS: 100.0000 mg | ORAL_CAPSULE | Freq: Every day | ORAL | 0 refills | Status: DC

## 2019-02-18 MED FILL — PROGESTERONE 100 MG CAPSULE: 100 | 90 days supply | Qty: 90 | Fill #0

## 2019-02-26 ENCOUNTER — Telehealth: Payer: Self-pay | Admitting: Gastroenterology

## 2019-02-26 DIAGNOSIS — K56609 Unspecified intestinal obstruction, unspecified as to partial versus complete obstruction: Secondary | ICD-10-CM

## 2019-02-26 NOTE — Telephone Encounter (Signed)
Discussed with patient her course and review of images. She is being referred to Dr. Drue Flirt of [facility] surgery for a second opinion on recurrent bowel obstructions. We discussed additional imaging to further evaluate this, specifically MR enterography. While I suspect adhesions in the RLQ is the most likely cause of her symptoms, she did want to pursue MR enterography to reassess when she is feeling well, ensure no intraluminal pathology and evaluate for persistent stenosis.    Dottie, can you help coordinate MR enterography for this patient? Thanks much

## 2019-02-26 NOTE — Telephone Encounter (Signed)
I have confirmed with Dr Havery Moros that he would like MR entero Abd and pelvis. Patient is scheduled for MR entero abd/pelvis on Saturday, 03/23/19 at 10:00 am with an 8 am arrival on 03/23/19 at [facility] radiology. NPO 4 hours prior. She will have labs done prior as well. Patient has been instructed of time/date/location and prep for procedure and verbalizes understanding.

## 2019-03-07 DIAGNOSIS — F4321 Adjustment disorder with depressed mood: Secondary | ICD-10-CM | POA: Diagnosis not present

## 2019-03-08 ENCOUNTER — Encounter: Payer: 59 | Admitting: Family Medicine

## 2019-03-09 ENCOUNTER — Ambulatory Visit (HOSPITAL_COMMUNITY): Payer: 59

## 2019-03-09 ENCOUNTER — Other Ambulatory Visit: Payer: Self-pay

## 2019-03-09 ENCOUNTER — Ambulatory Visit (HOSPITAL_COMMUNITY)
Admission: RE | Admit: 2019-03-09 | Discharge: 2019-03-09 | Disposition: A | Discharge status: Home / Self Care | Payer: 59 | Source: Ambulatory Visit | Attending: Gastroenterology | Admitting: Gastroenterology

## 2019-03-09 DIAGNOSIS — K56609 Unspecified intestinal obstruction, unspecified as to partial versus complete obstruction: Secondary | ICD-10-CM

## 2019-03-09 DIAGNOSIS — K219 Gastro-esophageal reflux disease without esophagitis: Secondary | ICD-10-CM | POA: Diagnosis not present

## 2019-03-09 MED ORDER — GADOBUTROL 1 MMOL/ML IV SOLN
5.0000 mL | Freq: Once | INTRAVENOUS | Status: AC | PRN
  Administered 2019-03-09: 5 mL via INTRAVENOUS

## 2019-03-19 ENCOUNTER — Other Ambulatory Visit (INDEPENDENT_AMBULATORY_CARE_PROVIDER_SITE_OTHER): Payer: 59

## 2019-03-19 DIAGNOSIS — D649 Anemia, unspecified: Secondary | ICD-10-CM

## 2019-03-19 DIAGNOSIS — Z13220 Encounter for screening for lipoid disorders: Secondary | ICD-10-CM

## 2019-03-19 LAB — COMPREHENSIVE METABOLIC PANEL
ALT: 10 U/L (ref 0–35)
AST: 13 U/L (ref 0–37)
Albumin: 4.3 g/dL (ref 3.5–5.2)
Alkaline Phosphatase: 50 U/L (ref 39–117)
BUN: 13 mg/dL (ref 6–23)
CO2: 28 mEq/L (ref 19–32)
Calcium: 8.6 mg/dL (ref 8.4–10.5)
Chloride: 106 mEq/L (ref 96–112)
Creatinine, Ser: 0.88 mg/dL (ref 0.40–1.20)
GFR: 66.56 mL/min (ref >60.00)
Glucose, Bld: 86 mg/dL (ref 70–99)
Potassium: 3.9 mEq/L (ref 3.5–5.1)
Sodium: 141 mEq/L (ref 135–145)
Total Bilirubin: 0.4 mg/dL (ref 0.2–1.2)
Total Protein: 6.8 g/dL (ref 6.0–8.3)

## 2019-03-19 LAB — CBC WITH DIFFERENTIAL/PLATELET
Basophils Absolute: 0.1 10*3/uL (ref 0.0–0.1)
Basophils Relative: 2.4 % (ref 0.0–3.0)
Eosinophils Absolute: 0.2 10*3/uL (ref 0.0–0.7)
Eosinophils Relative: 4.8 % (ref 0.0–5.0)
HCT: 34 % — ABNORMAL LOW (ref 36.0–46.0)
Hemoglobin: 10.9 g/dL — ABNORMAL LOW (ref 12.0–15.0)
Lymphocytes Relative: 43.2 % (ref 12.0–46.0)
Lymphs Abs: 1.5 10*3/uL (ref 0.7–4.0)
MCHC: 32 g/dL (ref 30.0–36.0)
MCV: 77.2 fl — ABNORMAL LOW (ref 78.0–100.0)
Monocytes Absolute: 0.5 10*3/uL (ref 0.1–1.0)
Monocytes Relative: 13.5 % — ABNORMAL HIGH (ref 3.0–12.0)
Neutro Abs: 1.2 10*3/uL — ABNORMAL LOW (ref 1.4–7.7)
Neutrophils Relative %: 36.1 % — ABNORMAL LOW (ref 43.0–77.0)
Platelets: 336 10*3/uL (ref 150.0–400.0)
RBC: 4.4 Mil/uL (ref 3.87–5.11)
RDW: 16.2 % — ABNORMAL HIGH (ref 11.5–15.5)
WBC: 3.4 10*3/uL — ABNORMAL LOW (ref 4.0–10.5)

## 2019-03-19 LAB — LIPID PANEL
Cholesterol: 213 mg/dL — ABNORMAL HIGH (ref 0–200)
HDL: 76.6 mg/dL (ref >39.00)
LDL Cholesterol: 122 mg/dL — ABNORMAL HIGH (ref 0–99)
NonHDL: 136.13
Total CHOL/HDL Ratio: 3
Triglycerides: 73 mg/dL (ref 0.0–149.0)
VLDL: 14.6 mg/dL (ref 0.0–40.0)

## 2019-03-20 DIAGNOSIS — F4321 Adjustment disorder with depressed mood: Secondary | ICD-10-CM | POA: Diagnosis not present

## 2019-03-22 ENCOUNTER — Encounter: Payer: Self-pay | Admitting: Family Medicine

## 2019-03-22 ENCOUNTER — Other Ambulatory Visit: Payer: Self-pay

## 2019-03-22 ENCOUNTER — Ambulatory Visit (INDEPENDENT_AMBULATORY_CARE_PROVIDER_SITE_OTHER): Payer: 59 | Admitting: Family Medicine

## 2019-03-22 VITALS — BP 98/70 | HR 75 | Temp 98.2°F | Resp 16 | Ht 64.0 in | Wt 123.0 lb

## 2019-03-22 DIAGNOSIS — D509 Iron deficiency anemia, unspecified: Secondary | ICD-10-CM | POA: Diagnosis not present

## 2019-03-22 DIAGNOSIS — Z0000 Encounter for general adult medical examination without abnormal findings: Secondary | ICD-10-CM

## 2019-03-22 DIAGNOSIS — R112 Nausea with vomiting, unspecified: Secondary | ICD-10-CM | POA: Diagnosis not present

## 2019-03-22 MED ORDER — FAMOTIDINE 20 MG PO TABS: 20.0000 mg | ORAL_TABLET | Freq: Two times a day (BID) | ORAL | 3 refills | Status: DC

## 2019-03-22 MED ORDER — PROGESTERONE MICRONIZED 100 MG PO CAPS: 100.0000 mg | ORAL_CAPSULE | Freq: Every day | ORAL | 3 refills | Status: DC

## 2019-03-22 MED ORDER — PROMETHAZINE HCL 25 MG RE SUPP: 25.0000 mg | Freq: Four times a day (QID) | RECTAL | 0 refills | Status: DC | PRN

## 2019-03-22 MED FILL — PROMETHAZINE HCL 25 MG SUPP: 25 | 2 days supply | Qty: 6 | Fill #0

## 2019-03-22 MED FILL — SM ACID REDUCER 20 MG TAB: 20 | 25 days supply | Qty: 50 | Fill #0

## 2019-03-22 NOTE — Patient Instructions (Addendum)
Fat loss lifestyle school  Recheck CBC in 2 weeks   Health Maintenance, Female Adopting a healthy lifestyle and getting preventive care are important in promoting health and wellness. Ask your health care provider about:  The right schedule for you to have regular tests and exams.  Things you can do on your own to prevent diseases and keep yourself healthy. What should I know about diet, weight, and exercise? Eat a healthy diet   Eat a diet that includes plenty of vegetables, fruits, low-fat dairy products, and lean protein.  Do not eat a lot of foods that are high in solid fats, added sugars, or sodium. Maintain a healthy weight Body mass index (BMI) is used to identify weight problems. It estimates body fat based on height and weight. Your health care provider can help determine your BMI and help you achieve or maintain a healthy weight. Get regular exercise Get regular exercise. This is one of the most important things you can do for your health. Most adults should:  Exercise for at least 150 minutes each week. The exercise should increase your heart rate and make you sweat (moderate-intensity exercise).  Do strengthening exercises at least twice a week. This is in addition to the moderate-intensity exercise.  Spend less time sitting. Even light physical activity can be beneficial. Watch cholesterol and blood lipids Have your blood tested for lipids and cholesterol at 56 years of age, then have this test every 5 years. Have your cholesterol levels checked more often if:  Your lipid or cholesterol levels are high.  You are older than 56 years of age.  You are at high risk for heart disease. What should I know about cancer screening? Depending on your health history and family history, you may need to have cancer screening at various ages. This may include screening for:  Breast cancer.  Cervical cancer.  Colorectal cancer.  Skin cancer.  Lung cancer. What should I  know about heart disease, diabetes, and high blood pressure? Blood pressure and heart disease  High blood pressure causes heart disease and increases the risk of stroke. This is more likely to develop in people who have high blood pressure readings, are of African descent, or are overweight.  Have your blood pressure checked: ? Every 3-5 years if you are 17-19 years of age. ? Every year if you are 35 years old or older. Diabetes Have regular diabetes screenings. This checks your fasting blood sugar level. Have the screening done:  Once every three years after age 29 if you are at a normal weight and have a low risk for diabetes.  More often and at a younger age if you are overweight or have a high risk for diabetes. What should I know about preventing infection? Hepatitis B If you have a higher risk for hepatitis B, you should be screened for this virus. Talk with your health care provider to find out if you are at risk for hepatitis B infection. Hepatitis C Testing is recommended for:  Everyone born from [birth year] through 1963.  Anyone with known risk factors for hepatitis C. Sexually transmitted infections (STIs)  Get screened for STIs, including gonorrhea and chlamydia, if: ? You are sexually active and are younger than 57 years of age. ? You are older than 56 years of age and your health care provider tells you that you are at risk for this type of infection. ? Your sexual activity has changed since you were last screened, and you are at  increased risk for chlamydia or gonorrhea. Ask your health care provider if you are at risk.  Ask your health care provider about whether you are at high risk for HIV. Your health care provider may recommend a prescription medicine to help prevent HIV infection. If you choose to take medicine to prevent HIV, you should first get tested for HIV. You should then be tested every 3 months for as long as you are taking the medicine. Pregnancy  If you are  about to stop having your period (premenopausal) and you may become pregnant, seek counseling before you get pregnant.  Take 400 to 800 micrograms (mcg) of folic acid every day if you become pregnant.  Ask for birth control (contraception) if you want to prevent pregnancy. Osteoporosis and menopause Osteoporosis is a disease in which the bones lose minerals and strength with aging. This can result in bone fractures. If you are 52 years old or older, or if you are at risk for osteoporosis and fractures, ask your health care provider if you should:  Be screened for bone loss.  Take a calcium or vitamin D supplement to lower your risk of fractures.  Be given hormone replacement therapy (HRT) to treat symptoms of menopause. Follow these instructions at home: Lifestyle  Do not use any products that contain nicotine or tobacco, such as cigarettes, e-cigarettes, and chewing tobacco. If you need help quitting, ask your health care provider.  Do not use street drugs.  Do not share needles.  Ask your health care provider for help if you need support or information about quitting drugs. Alcohol use  Do not drink alcohol if: ? Your health care provider tells you not to drink. ? You are pregnant, may be pregnant, or are planning to become pregnant.  If you drink alcohol: ? Limit how much you use to 0-1 drink a day. ? Limit intake if you are breastfeeding.  Be aware of how much alcohol is in your drink. In the [place] one drink equals one 12 oz bottle of beer (355 mL), one 5 oz glass of wine (148 mL), or one 1 oz glass of hard liquor (44 mL). General instructions  Schedule regular health, dental, and eye exams.  Stay current with your vaccines.  Tell your health care provider if: ? You often feel depressed. ? You have ever been abused or do not feel safe at home. Summary  Adopting a healthy lifestyle and getting preventive care are important in promoting health and wellness.  Follow  your health care provider's instructions about healthy diet, exercising, and getting tested or screened for diseases.  Follow your health care provider's instructions on monitoring your cholesterol and blood pressure. This information is not intended to replace advice given to you by your health care provider. Make sure you discuss any questions you have with your health care provider. Document Released: 03/14/2011 Document Revised: 08/22/2018 Document Reviewed: 08/22/2018 Elsevier Patient Education  2020 [employer].

## 2019-03-22 NOTE — Progress Notes (Signed)
Subjective:    Patient ID: Jennifer [name], female    DOB: 03-16-1961, 58 y.o.   MRN: [id number]  HPI This is a 58 yo female who presents today for CPE. Has been doing well.   Last CPE- 12/22/2017 Mammo- 10/29/18 Pap- [place] gyn in [place], [place] Colonoscopy- 06/09/2017 Tdap- she is unsure, is going to check her records Flu- annual Eye- annual Dental- regular Exercise- walks 4 miles several times a week  Palpitations- noticed while hiking at a high elevation last year and a couple of times since. Resolve spontaneously very quickly. No chest pain, no SOB.   Small bowel obstruction- has had several in the last couple of years. Last in the spring when she was in [facility] on vacation. She has an appointment with a surgeon at [facility] for second opinion regarding surgery. When she has episodes, she has terrible nausea and vomiting, unable to keep down ondansetron. Last ER visit she was hypokalemic with severe vomiting.   Anemia- noted on screening labs. Patient is O negative and gives blood every 6-8 weeks. She thinks she had recently donated prior to labs being drawn.   Past Medical History:  Diagnosis Date  . Bowel obstruction ([place])   . Fibroids   . GERD (gastroesophageal reflux disease)   . H/O small bowel obstruction   . Hyperlipidemia    Past Surgical History:  Procedure Laterality Date  . ABDOMINAL HYSTERECTOMY    . ROTATOR CUFF REPAIR Right 2017   Family History  Problem Relation Age of Onset  . Cancer Mother   . Early death Mother   . Diabetes Father   . Hyperlipidemia Father   . Heart disease Father   . Cancer Maternal Grandmother   . Cancer Paternal Grandmother    Social History   Tobacco Use  . Smoking status: Never Smoker  . Smokeless tobacco: Never Used  Substance Use Topics  . Alcohol use: Yes    Alcohol/week: 2.0 standard drinks    Types: 2 Glasses of wine per week  . Drug use: No      Review of Systems  Constitutional: Negative.   HENT: Negative.    Eyes: Negative.   Respiratory: Negative.   Cardiovascular: Positive for palpitations (see HPI).  Gastrointestinal: Negative.   Genitourinary: Negative.   Musculoskeletal: Negative.   Skin: Negative.   Allergic/Immunologic: Negative.   Neurological: Negative.   Hematological: Negative.   Psychiatric/Behavioral: Negative.        Objective:   Physical Exam Physical Exam  Constitutional: She is oriented to person, place, and time. She appears well-developed and well-nourished. No distress.  HENT:  Head: Normocephalic and atraumatic.  Right Ear: External ear normal.  Left Ear: External ear normal.  Nose: Nose normal.  Mouth/Throat: Oropharynx is clear and moist. No oropharyngeal exudate.  Eyes: Conjunctivae are normal. Pupils are equal, round, and reactive to light.  Neck: Normal range of motion. Neck supple. No JVD present. No thyromegaly present.  Cardiovascular: Normal rate, regular rhythm, normal heart sounds and intact distal pulses.   Pulmonary/Chest: Effort normal and breath sounds normal. Right breast exhibits no inverted nipple, no mass, no nipple discharge, no skin change and no tenderness. Left breast exhibits no inverted nipple, no mass, no nipple discharge, no skin change and no tenderness. Breasts are symmetrical.  Abdominal: Soft. Bowel sounds are normal. She exhibits no distension and no mass. There is no tenderness. There is no rebound and no guarding.  Musculoskeletal: Normal range of motion. She exhibits  no edema or tenderness.  Lymphadenopathy:    She has no cervical adenopathy.  Neurological: She is alert and oriented to person, place, and time. She has normal reflexes.  Skin: Skin is warm and dry. She is not diaphoretic.  Psychiatric: She has a normal mood and affect. Her behavior is normal. Judgment and thought content normal.  Vitals reviewed.     BP 98/70   Pulse 75   Temp 98.2 F (36.8 C)   Resp 16   Ht 5\' 4"  (1.626 m)   Wt 123 lb (55.8 kg)   BMI  21.11 kg/m  Wt Readings from Last 3 Encounters:  03/22/19 123 lb (55.8 kg)  05/21/18 120 lb (54.4 kg)  12/22/17 132 lb 12 oz (60.2 kg)       Assessment & Plan:  1. Annual physical exam - Discussed and encouraged healthy lifestyle choices- adequate sleep, regular exercise, stress management and healthy food choices.   2. Non-intractable vomiting with nausea, unspecified vomiting type - will give her a little promethazine to keep on hand - promethazine (PHENERGAN) 25 MG suppository; Place 1 suppository (25 mg total) rectally every 6 (six) hours as needed for nausea or vomiting.  Dispense: 6 each; Refill: 0  3. Microcytic anemia - discussed with patient, likely due to recent blood donation - will recheck CBC in a couple of weeks.   - progesterone and pepcid refills sent to patient's pharmacy  Clarene Reamer, FNP-BC  [facility]  03/22/2019 2:00 PM

## 2019-03-23 ENCOUNTER — Ambulatory Visit (HOSPITAL_COMMUNITY): Payer: 59

## 2019-03-27 DIAGNOSIS — F4321 Adjustment disorder with depressed mood: Secondary | ICD-10-CM | POA: Diagnosis not present

## 2019-04-08 ENCOUNTER — Other Ambulatory Visit (INDEPENDENT_AMBULATORY_CARE_PROVIDER_SITE_OTHER): Payer: 59

## 2019-04-08 DIAGNOSIS — D509 Iron deficiency anemia, unspecified: Secondary | ICD-10-CM | POA: Diagnosis not present

## 2019-04-08 LAB — CBC WITH DIFFERENTIAL/PLATELET
Basophils Absolute: 0.1 10*3/uL (ref 0.0–0.1)
Basophils Relative: 1.4 % (ref 0.0–3.0)
Eosinophils Absolute: 0.2 10*3/uL (ref 0.0–0.7)
Eosinophils Relative: 2.7 % (ref 0.0–5.0)
HCT: 35.5 % — ABNORMAL LOW (ref 36.0–46.0)
Hemoglobin: 11.1 g/dL — ABNORMAL LOW (ref 12.0–15.0)
Lymphocytes Relative: 26.5 % (ref 12.0–46.0)
Lymphs Abs: 1.9 10*3/uL (ref 0.7–4.0)
MCHC: 31.3 g/dL (ref 30.0–36.0)
MCV: 76.9 fl — ABNORMAL LOW (ref 78.0–100.0)
Monocytes Absolute: 0.7 10*3/uL (ref 0.1–1.0)
Monocytes Relative: 9.6 % (ref 3.0–12.0)
Neutro Abs: 4.2 10*3/uL (ref 1.4–7.7)
Neutrophils Relative %: 59.8 % (ref 43.0–77.0)
Platelets: 339 10*3/uL (ref 150.0–400.0)
RBC: 4.61 Mil/uL (ref 3.87–5.11)
RDW: 16.6 % — ABNORMAL HIGH (ref 11.5–15.5)
WBC: 7 10*3/uL (ref 4.0–10.5)

## 2019-04-12 MED FILL — FAMOTIDINE 20 MG TABS: 20 | 30 days supply | Qty: 60 | Fill #0

## 2019-04-22 ENCOUNTER — Other Ambulatory Visit: Payer: Self-pay | Admitting: Gastroenterology

## 2019-04-25 DIAGNOSIS — F4321 Adjustment disorder with depressed mood: Secondary | ICD-10-CM | POA: Diagnosis not present

## 2019-05-10 IMAGING — DX DG KNEE COMPLETE 4+V*R*
4 series · 4 of 4 positions shown · non-contrast
Comparison: None

CLINICAL DATA: Posterior RIGHT knee pain for 3 months

EXAM:
RIGHT KNEE - COMPLETE 4+ VIEW

[knee ap]
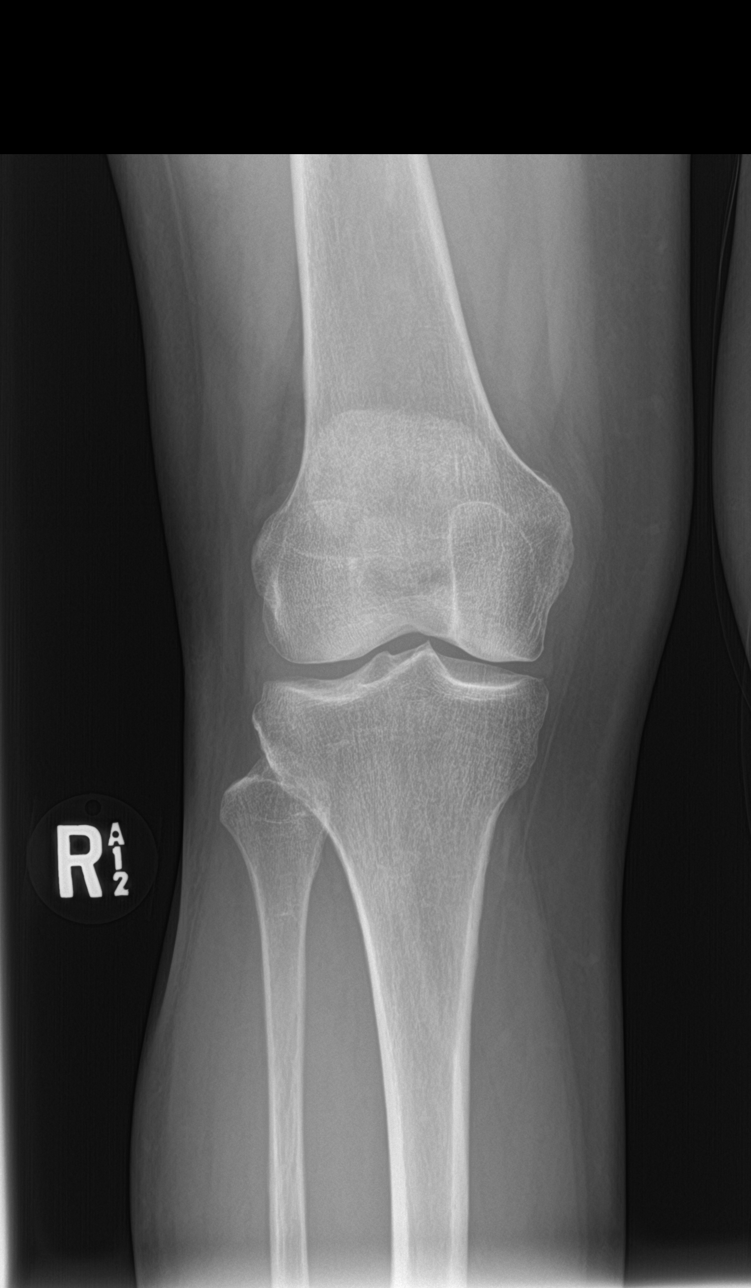

[knee tunnel]
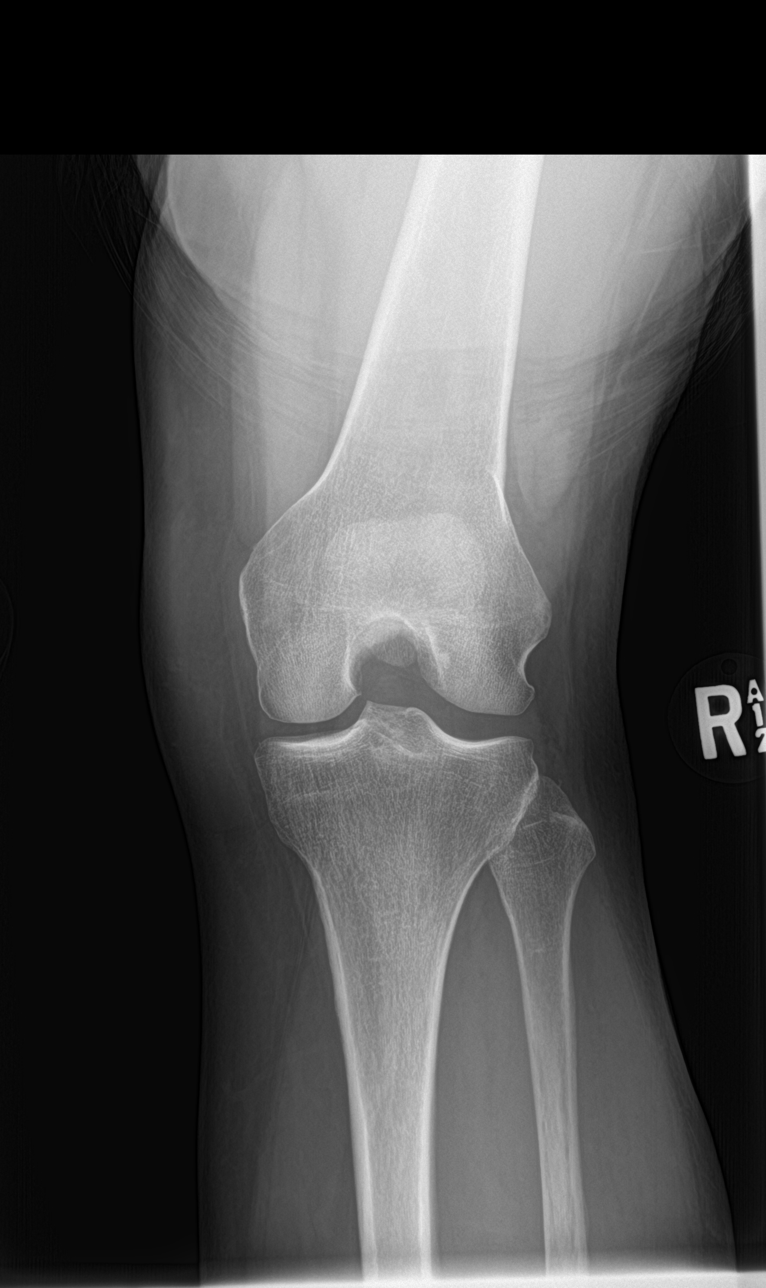

[knee lat]
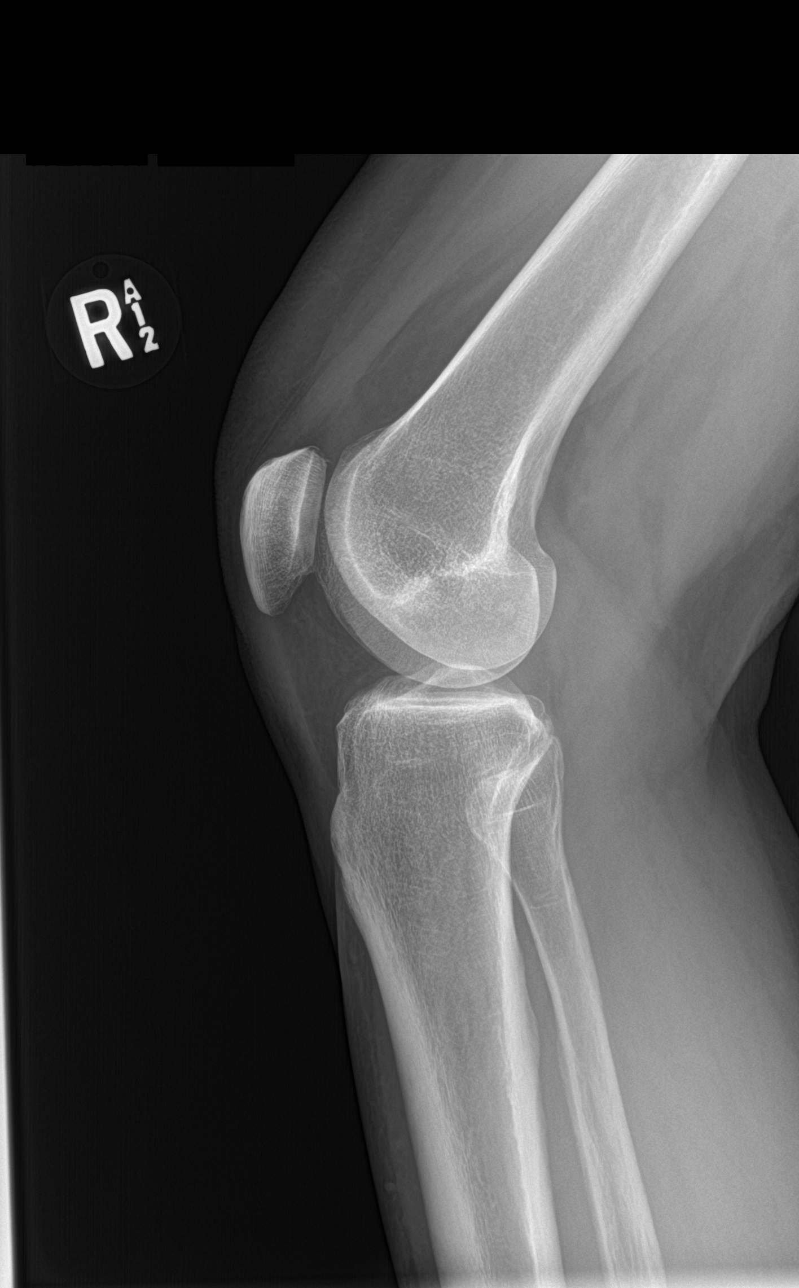

[sunrise]
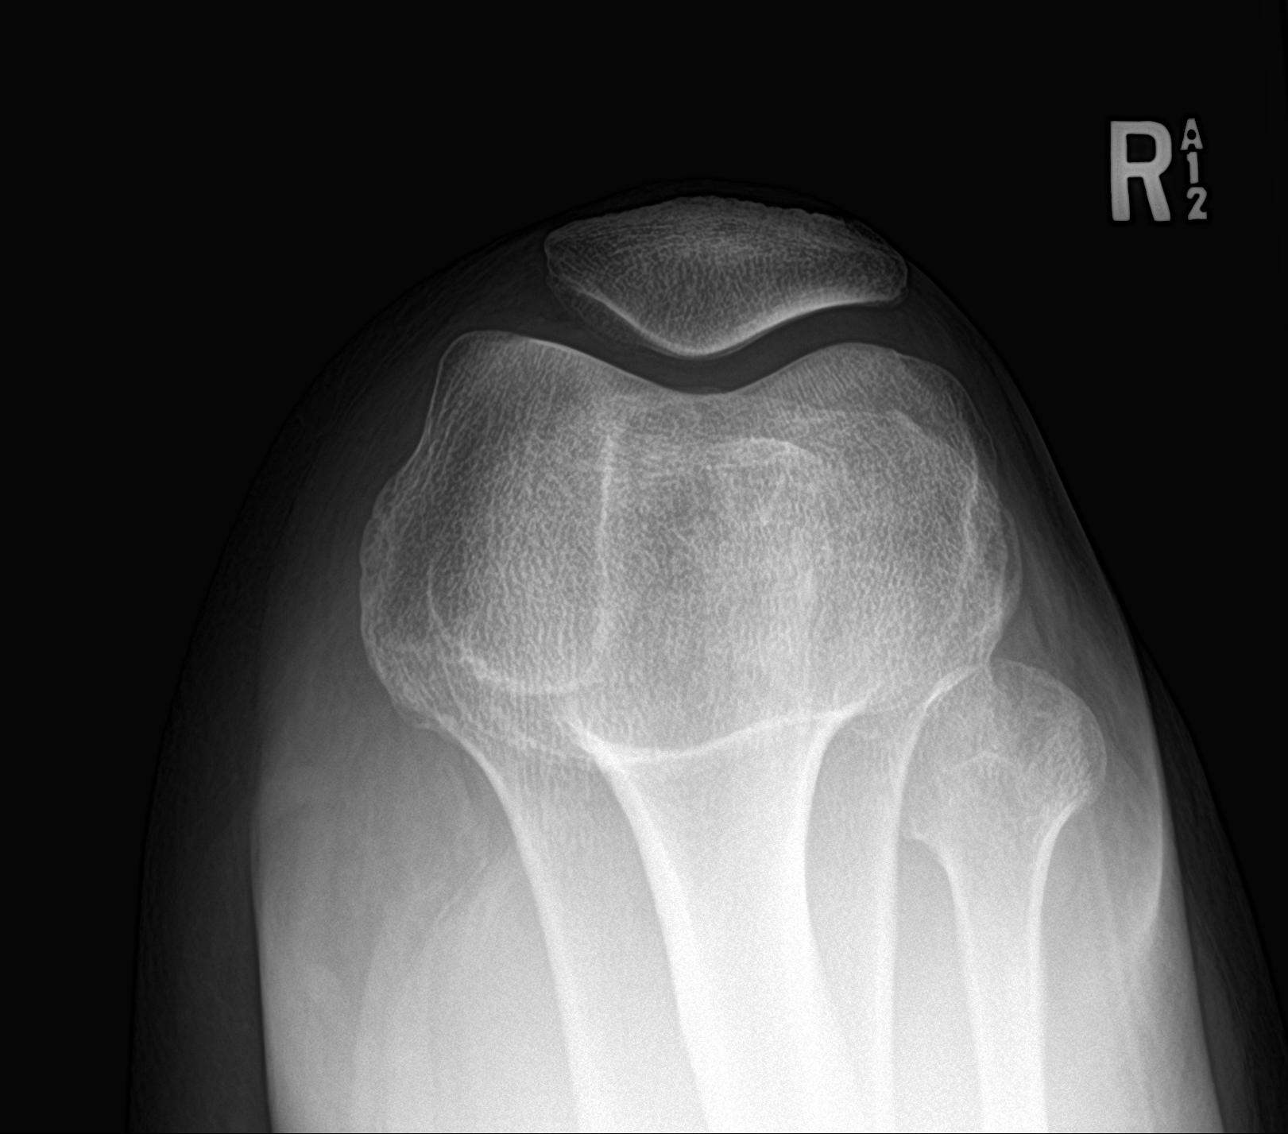

[4 of 4 positions shown; findings below may reference images not displayed]

FINDINGS: Osseous mineralization normal.

Joint spaces preserved.

No acute fracture, dislocation, or bone destruction.

No knee joint effusion.
IMPRESSION: Normal exam.

## 2019-05-10 MED FILL — FAMOTIDINE 20 MG TABS: 20 | 30 days supply | Qty: 60 | Fill #1

## 2019-05-22 DIAGNOSIS — K56609 Unspecified intestinal obstruction, unspecified as to partial versus complete obstruction: Secondary | ICD-10-CM | POA: Diagnosis not present

## 2019-05-31 DIAGNOSIS — F4321 Adjustment disorder with depressed mood: Secondary | ICD-10-CM | POA: Diagnosis not present

## 2019-06-06 DIAGNOSIS — H43812 Vitreous degeneration, left eye: Secondary | ICD-10-CM | POA: Diagnosis not present

## 2019-06-12 DIAGNOSIS — F4321 Adjustment disorder with depressed mood: Secondary | ICD-10-CM | POA: Diagnosis not present

## 2019-07-12 MED FILL — PROGESTERONE 100 MG CAPSULE: 100 | 90 days supply | Qty: 90 | Fill #0

## 2019-07-25 ENCOUNTER — Encounter: Payer: Self-pay | Admitting: Family Medicine

## 2019-07-25 ENCOUNTER — Other Ambulatory Visit: Payer: Self-pay | Admitting: Family Medicine

## 2019-07-25 DIAGNOSIS — E8940 Asymptomatic postprocedural ovarian failure: Secondary | ICD-10-CM

## 2019-07-25 DIAGNOSIS — R232 Flushing: Secondary | ICD-10-CM

## 2019-07-25 DIAGNOSIS — Z79890 Hormone replacement therapy (postmenopausal): Secondary | ICD-10-CM

## 2019-07-26 ENCOUNTER — Other Ambulatory Visit (INDEPENDENT_AMBULATORY_CARE_PROVIDER_SITE_OTHER): Payer: 59

## 2019-07-26 DIAGNOSIS — E8940 Asymptomatic postprocedural ovarian failure: Secondary | ICD-10-CM

## 2019-07-26 DIAGNOSIS — D509 Iron deficiency anemia, unspecified: Secondary | ICD-10-CM | POA: Diagnosis not present

## 2019-07-26 DIAGNOSIS — R232 Flushing: Secondary | ICD-10-CM | POA: Diagnosis not present

## 2019-07-26 DIAGNOSIS — Z79890 Hormone replacement therapy (postmenopausal): Secondary | ICD-10-CM | POA: Diagnosis not present

## 2019-07-26 LAB — CBC WITH DIFFERENTIAL/PLATELET
Basophils Absolute: 0.1 10*3/uL (ref 0.0–0.1)
Basophils Relative: 1.7 % (ref 0.0–3.0)
Eosinophils Absolute: 0.1 10*3/uL (ref 0.0–0.7)
Eosinophils Relative: 1.5 % (ref 0.0–5.0)
HCT: 36.5 % (ref 36.0–46.0)
Hemoglobin: 11.7 g/dL — ABNORMAL LOW (ref 12.0–15.0)
Lymphocytes Relative: 32.3 % (ref 12.0–46.0)
Lymphs Abs: 2 10*3/uL (ref 0.7–4.0)
MCHC: 32.2 g/dL (ref 30.0–36.0)
MCV: 76.2 fl — ABNORMAL LOW (ref 78.0–100.0)
Monocytes Absolute: 0.6 10*3/uL (ref 0.1–1.0)
Monocytes Relative: 10.4 % (ref 3.0–12.0)
Neutro Abs: 3.3 10*3/uL (ref 1.4–7.7)
Neutrophils Relative %: 54.1 % (ref 43.0–77.0)
Platelets: 303 10*3/uL (ref 150.0–400.0)
RBC: 4.79 Mil/uL (ref 3.87–5.11)
RDW: 16.1 % — ABNORMAL HIGH (ref 11.5–15.5)
WBC: 6.1 10*3/uL (ref 4.0–10.5)

## 2019-07-26 LAB — IRON: Iron: 37 ug/dL — ABNORMAL LOW (ref 42–145)

## 2019-07-27 LAB — TESTOSTERONE,FREE AND TOTAL
Testosterone, Free: 4.4 pg/mL — ABNORMAL HIGH (ref 0.0–4.2)
Testosterone: 37 ng/dL (ref 3–41)

## 2019-07-30 MED FILL — PROGESTERONE 100 MG CAPSULE: 100 | 90 days supply | Qty: 90 | Fill #0

## 2019-07-31 DIAGNOSIS — F4321 Adjustment disorder with depressed mood: Secondary | ICD-10-CM | POA: Diagnosis not present

## 2019-07-31 LAB — PROGESTERONE: Progesterone: 1.1 ng/mL

## 2019-07-31 LAB — ESTROGENS, TOTAL: Estrogen: 336.3 pg/mL

## 2019-08-01 MED FILL — FAMOTIDINE 20 MG TABS: 20 | 90 days supply | Qty: 180 | Fill #2

## 2019-08-19 DIAGNOSIS — L650 Telogen effluvium: Secondary | ICD-10-CM | POA: Diagnosis not present

## 2019-08-23 ENCOUNTER — Telehealth: Payer: Self-pay | Admitting: Gastroenterology

## 2019-08-23 ENCOUNTER — Telehealth: Payer: Self-pay

## 2019-08-23 DIAGNOSIS — K458 Other specified abdominal hernia without obstruction or gangrene: Secondary | ICD-10-CM | POA: Diagnosis not present

## 2019-08-23 DIAGNOSIS — Z885 Allergy status to narcotic agent status: Secondary | ICD-10-CM | POA: Diagnosis not present

## 2019-08-23 DIAGNOSIS — G8918 Other acute postprocedural pain: Secondary | ICD-10-CM | POA: Diagnosis not present

## 2019-08-23 DIAGNOSIS — Z4659 Encounter for fitting and adjustment of other gastrointestinal appliance and device: Secondary | ICD-10-CM | POA: Diagnosis not present

## 2019-08-23 DIAGNOSIS — K5651 Intestinal adhesions [bands], with partial obstruction: Secondary | ICD-10-CM | POA: Diagnosis not present

## 2019-08-23 DIAGNOSIS — K660 Peritoneal adhesions (postprocedural) (postinfection): Secondary | ICD-10-CM | POA: Diagnosis not present

## 2019-08-23 DIAGNOSIS — R1084 Generalized abdominal pain: Secondary | ICD-10-CM | POA: Diagnosis not present

## 2019-08-23 DIAGNOSIS — K56609 Unspecified intestinal obstruction, unspecified as to partial versus complete obstruction: Secondary | ICD-10-CM

## 2019-08-23 DIAGNOSIS — K565 Intestinal adhesions [bands], unspecified as to partial versus complete obstruction: Secondary | ICD-10-CM | POA: Diagnosis not present

## 2019-08-23 DIAGNOSIS — Z20828 Contact with and (suspected) exposure to other viral communicable diseases: Secondary | ICD-10-CM | POA: Diagnosis not present

## 2019-08-23 DIAGNOSIS — K219 Gastro-esophageal reflux disease without esophagitis: Secondary | ICD-10-CM | POA: Diagnosis not present

## 2019-08-23 DIAGNOSIS — R112 Nausea with vomiting, unspecified: Secondary | ICD-10-CM | POA: Diagnosis not present

## 2019-08-23 DIAGNOSIS — Z79899 Other long term (current) drug therapy: Secondary | ICD-10-CM | POA: Diagnosis not present

## 2019-08-23 DIAGNOSIS — D6489 Other specified anemias: Secondary | ICD-10-CM | POA: Diagnosis not present

## 2019-08-23 DIAGNOSIS — Z87891 Personal history of nicotine dependence: Secondary | ICD-10-CM | POA: Diagnosis not present

## 2019-08-23 DIAGNOSIS — Z90710 Acquired absence of both cervix and uterus: Secondary | ICD-10-CM | POA: Diagnosis not present

## 2019-08-23 MED ORDER — ONDANSETRON 4 MG PO TBDP: 4.0000 mg | ORAL_TABLET | Freq: Three times a day (TID) | ORAL | 1 refills | Status: DC | PRN

## 2019-08-23 MED FILL — ONDANSETRON ODT 4 MG TABLET: 4 | 7 days supply | Qty: 20 | Fill #0

## 2019-08-23 NOTE — Telephone Encounter (Signed)
Pt had bowel obstruction last year.pt started on 08/22/19  having abd pain that is not severe and pt is extremely nauseated. Pt has not vomited and does not want to go to ED yet. Pt said this morning the abd pain and distention was better but pt felt like she was going to pass out earlier this morning. Pt said she has felt like she was going to pass out before but usually associated it with extreme pain which pt is not having. Pt has been NPO except for few sips of water since 08/22/19.pt request refill of Zofran. Advised will send note to a provider in office to see if will refill zofran. [place]. ED precautions given and pt voiced understanding. Before I could get the note finished pt called back and Dr Havery Moros is calling in zofran and nothing further needed. FYI to Glenda Chroman FNP.

## 2019-08-23 NOTE — Telephone Encounter (Signed)
Patient is well known to me, history of small bowel obstructions. She called, has had recurrence of a partial obstruction she thinks. She is quite nauseated, having pain but able to manage at home on small sips of liquids. Will give some Zofran ODT to see if this helps and she should stay on a liquid diet today. If worsening and no improvement she will need to go to the hospital. She already has had a elective surgery scheduled with Dr. Drue Flirt of [facility] in the upcoming weeks for this.

## 2019-08-26 NOTE — Telephone Encounter (Signed)
Noted  

## 2019-09-04 ENCOUNTER — Other Ambulatory Visit: Payer: Self-pay | Admitting: *Deleted

## 2019-09-04 NOTE — Patient Outreach (Signed)
[facility]) Care Management  09/04/2019  Jennifer [name] 28-Mar-1961 [id number]   Transition of care telephone call  Referral received:09/04/19 Initial outreach:09/04/19 Insurance: [place]  Initial unsuccessful telephone call to patient's preferred number in order to complete transition of care assessment; no answer, left HIPAA compliant voicemail message requesting return call.   Objective: Per Electronic record , Jennifer [name]  was hospitalized at [facility] from 12/11-12-17 Dx. Small bowel obstruction due to adhesions, on 12/17 Laparoscopic lysis of adhesions with partial omentectomy for chronic partial small bowel obstruction .  Comorbidities include: Small bowel obstructions, Exploratory laparoscopy with lysis of adhesions x 4, GERD, Endometriosis .  She  was discharged to home on 08/29/19 without the need for home health services or DME.     Plan: This RNCM will route unsuccessful outreach letter with [place] Management pamphlet and 24 hour Nurse Advice Line Magnet to [place] Management clinical pool to be mailed to patient's home address. This RNCM will attempt another outreach within 4 business days.  Jennifer Draft, RN, [place] Management Coordinator  [phone]- Mobile [phone]- Toll Free Main Office

## 2019-09-09 ENCOUNTER — Encounter: Payer: Self-pay | Admitting: *Deleted

## 2019-09-09 ENCOUNTER — Other Ambulatory Visit: Payer: Self-pay | Admitting: *Deleted

## 2019-09-09 NOTE — Patient Outreach (Signed)
[facility]) Care Management  09/09/2019  Jennifer [name] 07/09/1961 [id number]  Transition of care call/case closure   Referral received:09/04/19 Initial outreach:09/04/19 Insurance: [place] [employer]    Subjective: Successful 2nd telephone call to patient's preferred number in order to complete transition of care assessment; 2 HIPAA identifiers verified. Explained purpose of call and completed transition of care assessment.  States she  is doing well getting back into a routine , she discussed having 4 previous bowel obstructions in the past , and this planned surgery had to be moved up, she expressed being happy that they were able to identify and fix the problem. She denies post-operative problems, says surgical incisions are unremarkable, glue at incision area with no signs of redness states surgical pain well managed with prescribed medications, she states she no longer has need to take oxycodone for pain. She is back on a regular diet and  tolerating diet, denies nausea or abdominal discomfort. She  denies bowel or bladder problems, she reports having regular bowel movements.  Spouse/children are assisting with her  recovery. She is tolerating mobility in the home, limiting amount of weight lifting.  Discussed assessing [place] benefits, Family medical leave when needed, states  she is currently assessing her [place] time.   She denies any ongoing health issues and says she does not need a referral to one of the [place] chronic disease management programs.  She is unsure if she has the benefit for the hospital indemnity,  She  uses a [facility] outpatient pharmacy.  She  denies educational needs related to staying safe during the COVID 19 pandemic.    Objective:   Jennifer [name] was hospitalized at [facility] from 12/11-12-17 Dx. Small bowel obstruction due to adhesions, on 12/17 Laparoscopic lysis of adhesions with partial omentectomy for chronic partial small  bowel obstruction .  Comorbidities include: Small bowel obstructions, Exploratory laparoscopy with lysis of adhesions x 4, GERD, Endometriosis .  She  was discharged to home on 08/29/19 without the need for home health servicesor DME.   Assessment:  Patient voices good understanding of all discharge instructions.  See transition of care flowsheet for assessment details.   Plan:  Reviewed hospital discharge diagnosis of Laparoscopic lysis of adhesions   and discharge treatment plan using hospital discharge instructions, assessing medication adherence, reviewing problems requiring provider notification, and discussing the importance of follow up with surgeon, and/or specialists as directed. Reviewed [place] healthy lifestyle program information to keep premiums low for 2022:  Step 1: Get annual physical between September 12, 2018 and March 12, 2020; Step 2: Complete your health assessment between September 13, 2019 and May 13, 2020 at [email or web address] Step 3:Identify your current health status and complete the corresponding action step between January 1, and May 13, 2020.   Patient requested that contact number for Benefits and  Hospital Indemnity plan be sent to her work email address, sent securely.   No ongoing care management needs identified so will close case to [place] Management services and route successful outreach letter with [place] Management pamphlet and 24 Hour Nurse Line Magnet to [place] Management clinical pool to be mailed to patient's home address. Thanked patient for their services to [facility].   Jennifer Draft, RN, [place] Management Coordinator  [phone]- Mobile [phone]- Toll Free Main Office

## 2019-09-17 ENCOUNTER — Encounter: Payer: Self-pay | Admitting: Family Medicine

## 2019-10-14 ENCOUNTER — Other Ambulatory Visit: Payer: Self-pay | Admitting: Family Medicine

## 2019-10-14 DIAGNOSIS — Z1231 Encounter for screening mammogram for malignant neoplasm of breast: Secondary | ICD-10-CM

## 2019-11-01 ENCOUNTER — Ambulatory Visit: Payer: 59 | Admitting: Family Medicine

## 2019-11-01 DIAGNOSIS — M5413 Radiculopathy, cervicothoracic region: Secondary | ICD-10-CM | POA: Diagnosis not present

## 2019-11-01 DIAGNOSIS — M9905 Segmental and somatic dysfunction of pelvic region: Secondary | ICD-10-CM | POA: Diagnosis not present

## 2019-11-01 DIAGNOSIS — M9902 Segmental and somatic dysfunction of thoracic region: Secondary | ICD-10-CM | POA: Diagnosis not present

## 2019-11-01 DIAGNOSIS — M9903 Segmental and somatic dysfunction of lumbar region: Secondary | ICD-10-CM | POA: Diagnosis not present

## 2019-11-01 DIAGNOSIS — M545 Low back pain: Secondary | ICD-10-CM | POA: Diagnosis not present

## 2019-11-01 DIAGNOSIS — M7918 Myalgia, other site: Secondary | ICD-10-CM | POA: Diagnosis not present

## 2019-11-01 DIAGNOSIS — M542 Cervicalgia: Secondary | ICD-10-CM | POA: Diagnosis not present

## 2019-11-01 DIAGNOSIS — M546 Pain in thoracic spine: Secondary | ICD-10-CM | POA: Diagnosis not present

## 2019-11-01 DIAGNOSIS — M9901 Segmental and somatic dysfunction of cervical region: Secondary | ICD-10-CM | POA: Diagnosis not present

## 2019-11-06 DIAGNOSIS — M545 Low back pain: Secondary | ICD-10-CM | POA: Diagnosis not present

## 2019-11-06 DIAGNOSIS — M542 Cervicalgia: Secondary | ICD-10-CM | POA: Diagnosis not present

## 2019-11-06 DIAGNOSIS — F4321 Adjustment disorder with depressed mood: Secondary | ICD-10-CM | POA: Diagnosis not present

## 2019-11-06 DIAGNOSIS — M9901 Segmental and somatic dysfunction of cervical region: Secondary | ICD-10-CM | POA: Diagnosis not present

## 2019-11-06 DIAGNOSIS — M9903 Segmental and somatic dysfunction of lumbar region: Secondary | ICD-10-CM | POA: Diagnosis not present

## 2019-11-06 DIAGNOSIS — M546 Pain in thoracic spine: Secondary | ICD-10-CM | POA: Diagnosis not present

## 2019-11-06 DIAGNOSIS — M9905 Segmental and somatic dysfunction of pelvic region: Secondary | ICD-10-CM | POA: Diagnosis not present

## 2019-11-06 DIAGNOSIS — M9902 Segmental and somatic dysfunction of thoracic region: Secondary | ICD-10-CM | POA: Diagnosis not present

## 2019-11-06 DIAGNOSIS — M7918 Myalgia, other site: Secondary | ICD-10-CM | POA: Diagnosis not present

## 2019-11-06 DIAGNOSIS — M5413 Radiculopathy, cervicothoracic region: Secondary | ICD-10-CM | POA: Diagnosis not present

## 2019-11-08 DIAGNOSIS — M545 Low back pain: Secondary | ICD-10-CM | POA: Diagnosis not present

## 2019-11-08 DIAGNOSIS — M542 Cervicalgia: Secondary | ICD-10-CM | POA: Diagnosis not present

## 2019-11-08 DIAGNOSIS — M9901 Segmental and somatic dysfunction of cervical region: Secondary | ICD-10-CM | POA: Diagnosis not present

## 2019-11-08 DIAGNOSIS — M7918 Myalgia, other site: Secondary | ICD-10-CM | POA: Diagnosis not present

## 2019-11-08 DIAGNOSIS — M9905 Segmental and somatic dysfunction of pelvic region: Secondary | ICD-10-CM | POA: Diagnosis not present

## 2019-11-08 DIAGNOSIS — M546 Pain in thoracic spine: Secondary | ICD-10-CM | POA: Diagnosis not present

## 2019-11-08 DIAGNOSIS — M5413 Radiculopathy, cervicothoracic region: Secondary | ICD-10-CM | POA: Diagnosis not present

## 2019-11-08 DIAGNOSIS — M9903 Segmental and somatic dysfunction of lumbar region: Secondary | ICD-10-CM | POA: Diagnosis not present

## 2019-11-08 DIAGNOSIS — M9902 Segmental and somatic dysfunction of thoracic region: Secondary | ICD-10-CM | POA: Diagnosis not present

## 2019-11-12 DIAGNOSIS — F4321 Adjustment disorder with depressed mood: Secondary | ICD-10-CM | POA: Diagnosis not present

## 2019-11-18 MED FILL — PROGESTERONE 100 MG CAPSULE: 100 | 90 days supply | Qty: 90 | Fill #1

## 2019-11-22 ENCOUNTER — Ambulatory Visit: Payer: 59

## 2019-12-13 ENCOUNTER — Ambulatory Visit
Admission: RE | Admit: 2019-12-13 | Discharge: 2019-12-13 | Disposition: A | Discharge status: Home / Self Care | Payer: 59 | Source: Ambulatory Visit | Attending: Family Medicine | Admitting: Family Medicine

## 2019-12-13 ENCOUNTER — Other Ambulatory Visit: Payer: Self-pay

## 2019-12-13 DIAGNOSIS — Z1231 Encounter for screening mammogram for malignant neoplasm of breast: Secondary | ICD-10-CM | POA: Diagnosis not present

## 2019-12-13 DIAGNOSIS — M545 Low back pain: Secondary | ICD-10-CM | POA: Diagnosis not present

## 2019-12-13 DIAGNOSIS — M546 Pain in thoracic spine: Secondary | ICD-10-CM | POA: Diagnosis not present

## 2019-12-13 DIAGNOSIS — M9901 Segmental and somatic dysfunction of cervical region: Secondary | ICD-10-CM | POA: Diagnosis not present

## 2019-12-13 DIAGNOSIS — M542 Cervicalgia: Secondary | ICD-10-CM | POA: Diagnosis not present

## 2019-12-13 DIAGNOSIS — M9903 Segmental and somatic dysfunction of lumbar region: Secondary | ICD-10-CM | POA: Diagnosis not present

## 2019-12-13 DIAGNOSIS — M7918 Myalgia, other site: Secondary | ICD-10-CM | POA: Diagnosis not present

## 2019-12-13 DIAGNOSIS — M9902 Segmental and somatic dysfunction of thoracic region: Secondary | ICD-10-CM | POA: Diagnosis not present

## 2019-12-13 DIAGNOSIS — M5413 Radiculopathy, cervicothoracic region: Secondary | ICD-10-CM | POA: Diagnosis not present

## 2019-12-13 DIAGNOSIS — M9905 Segmental and somatic dysfunction of pelvic region: Secondary | ICD-10-CM | POA: Diagnosis not present

## 2020-02-05 MED FILL — FAMOTIDINE 20 MG TABLET: 20 | 90 days supply | Qty: 180 | Fill #3

## 2020-02-12 ENCOUNTER — Encounter: Payer: Self-pay | Admitting: Family Medicine

## 2020-02-13 ENCOUNTER — Other Ambulatory Visit: Payer: Self-pay | Admitting: Family Medicine

## 2020-02-13 DIAGNOSIS — Z79890 Hormone replacement therapy (postmenopausal): Secondary | ICD-10-CM

## 2020-02-13 DIAGNOSIS — E8940 Asymptomatic postprocedural ovarian failure: Secondary | ICD-10-CM

## 2020-02-25 ENCOUNTER — Other Ambulatory Visit: Payer: 59

## 2020-02-25 DIAGNOSIS — Z79890 Hormone replacement therapy (postmenopausal): Secondary | ICD-10-CM | POA: Diagnosis not present

## 2020-02-25 DIAGNOSIS — E8940 Asymptomatic postprocedural ovarian failure: Secondary | ICD-10-CM | POA: Diagnosis not present

## 2020-02-27 LAB — TESTOSTERONE,FREE AND TOTAL
Testosterone, Free: 3.2 pg/mL (ref 0.0–4.2)
Testosterone: 51 ng/dL — ABNORMAL HIGH (ref 4–50)

## 2020-03-01 LAB — ESTROGENS, TOTAL: Estrogen: 212.3 pg/mL

## 2020-03-01 LAB — PROGESTERONE: Progesterone: 1.8 ng/mL

## 2020-03-05 DIAGNOSIS — M8580 Other specified disorders of bone density and structure, unspecified site: Secondary | ICD-10-CM | POA: Diagnosis not present

## 2020-03-05 DIAGNOSIS — N951 Menopausal and female climacteric states: Secondary | ICD-10-CM | POA: Diagnosis not present

## 2020-03-05 DIAGNOSIS — Z1231 Encounter for screening mammogram for malignant neoplasm of breast: Secondary | ICD-10-CM | POA: Diagnosis not present

## 2020-03-10 ENCOUNTER — Encounter: Payer: Self-pay | Admitting: Family Medicine

## 2020-03-11 ENCOUNTER — Other Ambulatory Visit: Payer: Self-pay | Admitting: Family Medicine

## 2020-03-11 ENCOUNTER — Other Ambulatory Visit: Payer: 59

## 2020-03-11 DIAGNOSIS — N959 Unspecified menopausal and perimenopausal disorder: Secondary | ICD-10-CM

## 2020-03-11 LAB — ESTRADIOL: Estradiol: 48 pg/mL

## 2020-03-27 ENCOUNTER — Encounter: Payer: 59 | Admitting: Family Medicine

## 2020-04-16 ENCOUNTER — Telehealth: Payer: Self-pay

## 2020-04-16 ENCOUNTER — Other Ambulatory Visit: Payer: Self-pay | Admitting: Family Medicine

## 2020-04-16 ENCOUNTER — Other Ambulatory Visit: Payer: 59

## 2020-04-16 ENCOUNTER — Encounter: Payer: Self-pay | Admitting: Family Medicine

## 2020-04-16 DIAGNOSIS — E8940 Asymptomatic postprocedural ovarian failure: Secondary | ICD-10-CM

## 2020-04-16 DIAGNOSIS — Z13220 Encounter for screening for lipoid disorders: Secondary | ICD-10-CM

## 2020-04-16 DIAGNOSIS — D509 Iron deficiency anemia, unspecified: Secondary | ICD-10-CM

## 2020-04-16 DIAGNOSIS — Z79890 Hormone replacement therapy (postmenopausal): Secondary | ICD-10-CM

## 2020-04-16 NOTE — Telephone Encounter (Signed)
Labs ordered.

## 2020-04-16 NOTE — Telephone Encounter (Signed)
Pt would like to go to the lab in the morning to have labs drawn for CPE on Monday at the [facility] lab.... If you are unable to get orders done for tomorrow, she would like to get the fasting labs done 1st thing Monday morning... please advise

## 2020-04-20 ENCOUNTER — Other Ambulatory Visit: Payer: Self-pay

## 2020-04-20 ENCOUNTER — Ambulatory Visit (INDEPENDENT_AMBULATORY_CARE_PROVIDER_SITE_OTHER): Payer: 59 | Admitting: Family Medicine

## 2020-04-20 ENCOUNTER — Encounter: Payer: Self-pay | Admitting: Family Medicine

## 2020-04-20 VITALS — BP 106/82 | HR 80 | Ht 64.0 in | Wt 125.0 lb

## 2020-04-20 DIAGNOSIS — D509 Iron deficiency anemia, unspecified: Secondary | ICD-10-CM

## 2020-04-20 DIAGNOSIS — E8940 Asymptomatic postprocedural ovarian failure: Secondary | ICD-10-CM

## 2020-04-20 DIAGNOSIS — Z79890 Hormone replacement therapy (postmenopausal): Secondary | ICD-10-CM | POA: Diagnosis not present

## 2020-04-20 DIAGNOSIS — L290 Pruritus ani: Secondary | ICD-10-CM

## 2020-04-20 DIAGNOSIS — Z13220 Encounter for screening for lipoid disorders: Secondary | ICD-10-CM | POA: Diagnosis not present

## 2020-04-20 DIAGNOSIS — Z0000 Encounter for general adult medical examination without abnormal findings: Secondary | ICD-10-CM | POA: Diagnosis not present

## 2020-04-20 DIAGNOSIS — Z1159 Encounter for screening for other viral diseases: Secondary | ICD-10-CM | POA: Diagnosis not present

## 2020-04-20 DIAGNOSIS — M533 Sacrococcygeal disorders, not elsewhere classified: Secondary | ICD-10-CM

## 2020-04-20 LAB — COMPREHENSIVE METABOLIC PANEL
ALT: 12 U/L (ref 0–35)
AST: 16 U/L (ref 0–37)
Albumin: 4.2 g/dL (ref 3.5–5.2)
Alkaline Phosphatase: 48 U/L (ref 39–117)
BUN: 16 mg/dL (ref 6–23)
CO2: 27 mEq/L (ref 19–32)
Calcium: 9.2 mg/dL (ref 8.4–10.5)
Chloride: 104 mEq/L (ref 96–112)
Creatinine, Ser: 0.97 mg/dL (ref 0.40–1.20)
GFR: 59.25 mL/min — ABNORMAL LOW (ref >60.00)
Glucose, Bld: 95 mg/dL (ref 70–99)
Potassium: 4.1 mEq/L (ref 3.5–5.1)
Sodium: 141 mEq/L (ref 135–145)
Total Bilirubin: 0.4 mg/dL (ref 0.2–1.2)
Total Protein: 6.8 g/dL (ref 6.0–8.3)

## 2020-04-20 LAB — CBC WITH DIFFERENTIAL/PLATELET
Basophils Absolute: 0.1 10*3/uL (ref 0.0–0.1)
Basophils Relative: 1.1 % (ref 0.0–3.0)
Eosinophils Absolute: 0.1 10*3/uL (ref 0.0–0.7)
Eosinophils Relative: 1.6 % (ref 0.0–5.0)
HCT: 42.3 % (ref 36.0–46.0)
Hemoglobin: 14.4 g/dL (ref 12.0–15.0)
Lymphocytes Relative: 24.8 % (ref 12.0–46.0)
Lymphs Abs: 1.4 10*3/uL (ref 0.7–4.0)
MCHC: 34.1 g/dL (ref 30.0–36.0)
MCV: 90.5 fl (ref 78.0–100.0)
Monocytes Absolute: 0.6 10*3/uL (ref 0.1–1.0)
Monocytes Relative: 10.2 % (ref 3.0–12.0)
Neutro Abs: 3.4 10*3/uL (ref 1.4–7.7)
Neutrophils Relative %: 62.3 % (ref 43.0–77.0)
Platelets: 234 10*3/uL (ref 150.0–400.0)
RBC: 4.67 Mil/uL (ref 3.87–5.11)
RDW: 12.3 % (ref 11.5–15.5)
WBC: 5.5 10*3/uL (ref 4.0–10.5)

## 2020-04-20 LAB — LIPID PANEL
Cholesterol: 224 mg/dL — ABNORMAL HIGH (ref 0–200)
HDL: 78.8 mg/dL (ref >39.00)
LDL Cholesterol: 133 mg/dL — ABNORMAL HIGH (ref 0–99)
NonHDL: 145.57
Total CHOL/HDL Ratio: 3
Triglycerides: 63 mg/dL (ref 0.0–149.0)
VLDL: 12.6 mg/dL (ref 0.0–40.0)

## 2020-04-20 LAB — FERRITIN: Ferritin: 63.2 ng/mL (ref 10.0–291.0)

## 2020-04-20 LAB — VITAMIN D 25 HYDROXY (VIT D DEFICIENCY, FRACTURES): VITD: 29.45 ng/mL — ABNORMAL LOW (ref 30.00–100.00)

## 2020-04-20 NOTE — Patient Instructions (Addendum)
Good to see you today  For tailbone pain- take ibuprofen 2 tablets twice a day for 5-7 days to see if it helps?   For anal itching, apply hydrocortisone 1% twice a day for 10 days. If no improvement, let me know and I'll put in a referral for dermatology   Anal Pruritus Anal pruritus is an itchy feeling in the anus and on the skin around the anus. This is common and can be caused by many things. It often occurs when the area becomes moist. Moisture may be due to sweating or to a small amount of stool (feces) that is left on the area because of poor personal cleaning. Some other causes include:  Things that can irritate your skin, such as: ? Perfumed soaps and sprays. ? Colored or scented toilet paper. ? Excessive washing.  Certain foods, such as caffeine, beer, and spicy foods.  Diarrhea or loose stool.  Skin disorders (psoriasis, eczema, or seborrhea).  Hemorrhoids, fissures, infections, and other anal diseases.  Other medical conditions, such as diabetes, thyroid problems, STIs (sexually transmitted infections), or some cancers. In many cases, the cause is not known. The itching usually goes away with treatment and home care. Scratching can cause further skin damage and make the problem worse. Follow these instructions at home: Skin care      Practice good hygiene. ? Clean the anal area gently with wet toilet paper or a wet washcloth after every bowel movement and at bedtime. ? Avoid using soaps on the anal area. ? Dry the area thoroughly. Pat the area dry with toilet paper or a towel.  Do not scrub the anal area with anything, including toilet paper.  Do not scratch the itchy area. Scratching causes more damage and makes the itching worse.  Take sitz baths as told by your health care provider. ? A sitz bath is a warm water bath that only comes up to your hips and covers your buttocks. A sitz bath may be done at home in a bathtub or with a portable sitz bath that fits over  the toilet. ? Pat the area dry with a soft cloth after each bath.  Use creams or ointments as told by your health care provider. Zinc oxide ointment or a moisture barrier cream can be applied several times a day to protect and heal the skin.  Do not use anything that irritates the skin, such as bubble baths, scented toilet paper, or genital deodorants. General instructions  Pay attention to any changes in your symptoms.  Take or apply over-the-counter and prescription medicines only as told by your health care provider.  Avoid overusing medicines that help you have a bowel movement (laxatives). These can cause you to have loose stools.  Talk with your health care provider about whether you should increase the fiber in your diet. This can help keep your stool normal if you have frequent loose stools.  Limit or avoid foods that may cause your symptoms. These may include: ? Spicy foods, such as salsa, jalapeo peppers, and spicy seasonings. ? Caffeine or beer. ? Milk products. ? Chocolate, nuts, citrus fruits, or tomatoes.  Wear cotton underwear and loose clothing.  Keep all follow-up visits as told by your health care provider. This is important. Contact a health care provider if:  Your itching does not improve in several days.  Your itching gets worse.  You have a fever.  You have redness, swelling, or pain in the anal area.  You have fluid, blood,  or pus coming from the anal area. Summary  Anal pruritus is an itchy feeling in the anus and the skin in the anal area. This can be caused by many things, such as things that irritate your skin and certain medical conditions.  Take or apply over-the-counter and prescription medicines only as told by your health care provider.  Practice good hygiene as told by your health care provider.  Talk with your health care provider about fiber supplements. These are helpful in keeping your stool normal if you have frequent loose  stools.  Contact a health care provider if your symptoms get worse or if you develop new symptoms. This information is not intended to replace advice given to you by your health care provider. Make sure you discuss any questions you have with your health care provider. Document Revised: 01/08/2018 Document Reviewed: 01/08/2018 Elsevier Patient Education  [employer].

## 2020-04-20 NOTE — Progress Notes (Signed)
Subjective:    Patient ID: Jennifer [name], female    DOB: [birth year], 59 y.o.   MRN: [id number]  HPI Chief Complaint  Patient presents with  . Annual Exam   This is a 59 yo female who presents today for annual exam.   Last CPE- 03/2019 Mammo- 12/13/2019 Pap- history of total hysterectomy, followed by gyn in [facility] for HRT Colonoscopy- 06/09/2016 Tdap- has had for work at [employer]- regular Dental- regular Exercise- some walking, not as much as she would like  Paint of sacrum- has had for a couple of weeks, no known injury, hurts if she applies pressure, sits on something soft.   Anal itching- for many years, has tried over the counter cream, wipes. Constant. No diarrhea, constipation.   History of recurrent SBO, had surgery to removed adhesions 12/20, no recurrence.    Review of Systems  Constitutional: Negative.   HENT: Negative.   Eyes: Negative.   Respiratory: Negative.   Cardiovascular: Negative.   Gastrointestinal: Negative.   Endocrine: Negative.   Genitourinary: Negative.   Musculoskeletal:       Sacral pain per hpi  Skin:       Itching per HPI  Allergic/Immunologic: Negative.   Neurological: Negative.   Hematological: Negative.   Psychiatric/Behavioral: Negative.        Objective:   Physical Exam Vitals reviewed.  Constitutional:      Appearance: Normal appearance. She is normal weight.  HENT:     Head: Normocephalic and atraumatic.     Right Ear: Tympanic membrane, ear canal and external ear normal.     Left Ear: Tympanic membrane, ear canal and external ear normal.     Nose: Nose normal.     Mouth/Throat:     Mouth: Mucous membranes are moist.     Pharynx: Oropharynx is clear.  Eyes:     Conjunctiva/sclera: Conjunctivae normal.  Cardiovascular:     Rate and Rhythm: Normal rate and regular rhythm.     Pulses: Normal pulses.     Heart sounds: Normal heart sounds.  Pulmonary:     Effort: Pulmonary effort is normal.     Breath sounds:  Normal breath sounds.  Abdominal:     General: Abdomen is flat. Bowel sounds are normal. There is no distension.     Palpations: Abdomen is soft. There is no mass.     Tenderness: There is no abdominal tenderness. There is no guarding or rebound.     Hernia: No hernia is present.  Genitourinary:    Rectum: No external hemorrhoid.     Comments: Left side of rectum with mild erythema, otherwise no lesions, normal to palpation.  Musculoskeletal:        General: Tenderness (end of sacrum) present. Normal range of motion.     Cervical back: Normal range of motion and neck supple.     Right lower leg: No edema.     Left lower leg: No edema.  Skin:    General: Skin is warm and dry.  Neurological:     Mental Status: She is alert and oriented to person, place, and time.  Psychiatric:        Mood and Affect: Mood normal.        Behavior: Behavior normal.        Thought Content: Thought content normal.        Judgment: Judgment normal.       BP 106/82   Pulse 80   Ht  5\' 4"  (1.626 m)   Wt 125 lb (56.7 kg)   SpO2 98%   BMI 21.46 kg/m      Assessment & Plan:  1. Annual physical exam - Discussed and encouraged healthy lifestyle choices- adequate sleep, regular exercise, stress management and healthy food choices.    2. Encounter for hepatitis C screening test for low risk patient - Hepatitis C antibody  3. 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 Screening for lipid disorders - Lipid panel  5. Microcytic anemia - Ferritin - CBC with Differential/Platelet  6. Sacral pain - will try course of otc NSAIDs, if no improvement, can consider xray  7. Pruritus ani - will have her try consistent course of hydrocortisone 1% for 10 days, if not significantly improved, will place derm referral  This visit occurred during the SARS-CoV-2 public health emergency.  Safety protocols were in place, including screening questions prior  to the visit, additional usage of staff PPE, and extensive cleaning of exam room while observing appropriate contact time as indicated for disinfecting solutions.      Clarene Reamer, FNP-BC  [facility]  04/21/2020 9:45 AM

## 2020-04-21 DIAGNOSIS — L290 Pruritus ani: Secondary | ICD-10-CM | POA: Insufficient documentation

## 2020-04-21 DIAGNOSIS — D509 Iron deficiency anemia, unspecified: Secondary | ICD-10-CM | POA: Insufficient documentation

## 2020-04-21 DIAGNOSIS — E8940 Asymptomatic postprocedural ovarian failure: Secondary | ICD-10-CM | POA: Insufficient documentation

## 2020-04-21 LAB — HEPATITIS C ANTIBODY
Hepatitis C Ab: NONREACTIVE
SIGNAL TO CUT-OFF: 0 (ref <1.00)

## 2020-04-27 ENCOUNTER — Encounter: Payer: Self-pay | Admitting: Family Medicine

## 2020-05-24 ENCOUNTER — Encounter: Payer: Self-pay | Admitting: Family Medicine

## 2020-05-25 ENCOUNTER — Other Ambulatory Visit: Payer: Self-pay | Admitting: Family Medicine

## 2020-05-25 DIAGNOSIS — M533 Sacrococcygeal disorders, not elsewhere classified: Secondary | ICD-10-CM

## 2020-05-26 ENCOUNTER — Other Ambulatory Visit: Payer: Self-pay

## 2020-05-26 ENCOUNTER — Ambulatory Visit (INDEPENDENT_AMBULATORY_CARE_PROVIDER_SITE_OTHER)
Admission: RE | Admit: 2020-05-26 | Discharge: 2020-05-26 | Disposition: A | Discharge status: Home / Self Care | Payer: 59 | Source: Ambulatory Visit | Attending: Family Medicine | Admitting: Family Medicine

## 2020-05-26 DIAGNOSIS — M533 Sacrococcygeal disorders, not elsewhere classified: Secondary | ICD-10-CM | POA: Diagnosis not present

## 2020-05-27 ENCOUNTER — Encounter: Payer: Self-pay | Admitting: Family Medicine

## 2020-06-01 ENCOUNTER — Other Ambulatory Visit: Payer: Self-pay | Admitting: Family Medicine

## 2020-06-01 MED FILL — PROGESTERONE 100 MG CAPS: 100 | 90 days supply | Qty: 90 | Fill #0

## 2020-06-09 DIAGNOSIS — L570 Actinic keratosis: Secondary | ICD-10-CM | POA: Diagnosis not present

## 2020-06-09 DIAGNOSIS — L720 Epidermal cyst: Secondary | ICD-10-CM | POA: Diagnosis not present

## 2020-06-10 ENCOUNTER — Other Ambulatory Visit: Payer: Self-pay | Admitting: Family Medicine

## 2020-06-10 DIAGNOSIS — M533 Sacrococcygeal disorders, not elsewhere classified: Secondary | ICD-10-CM

## 2020-06-22 ENCOUNTER — Other Ambulatory Visit: Payer: Self-pay | Admitting: Family Medicine

## 2020-06-22 ENCOUNTER — Encounter: Payer: Self-pay | Admitting: Family Medicine

## 2020-06-22 DIAGNOSIS — F5104 Psychophysiologic insomnia: Secondary | ICD-10-CM

## 2020-06-22 MED ORDER — ZOLPIDEM TARTRATE 5 MG PO TABS: 5.0000 mg | ORAL_TABLET | Freq: Every evening | ORAL | 0 refills | Status: DC | PRN

## 2020-06-22 MED FILL — ZOLPIDEM TARTRATE 5 MG TABS: 5 | 10 days supply | Qty: 10 | Fill #0

## 2020-06-22 NOTE — Telephone Encounter (Signed)
Ambien not on current medication list.

## 2020-07-01 NOTE — Progress Notes (Signed)
Jennifer Jennifer [name] Phone: [phone] Subjective:   Jennifer Jennifer [name], am serving as a scribe for Dr. Hulan Saas. This visit occurred during the SARS-CoV-2 public health emergency.  Safety protocols were in place, including screening questions prior to the visit, additional usage of staff PPE, and extensive cleaning of exam room while observing appropriate contact time as indicated for disinfecting solutions.   I'm seeing this patient by the request  of:  Elby Beck, FNP  CC: Low back and sacral pain  IRC:[id number]  Jennifer Jennifer [name] is a 59 y.o. female coming in with complaint of low back/sacral pain. Coccyx xray 05/2020 (-). Patient states that she has had pain for 3 months on tip of coccyx. Patient has pain with sitting on soft surfaces and with palpation. Jennifer [name] injury. Due to kidney function unable to take NSAIDs.   Did injure tail bone in accident as a child.   X-rays of the sacrum were taken on September 21 were independently visualized by me showing Jennifer [name] significant bony abnormality but does have mild elongation of the coccyx    Past Medical History:  Diagnosis Date  . Bowel obstruction ([place])   . Fibroids   . GERD (gastroesophageal reflux disease)   . H/O small bowel obstruction   . Hyperlipidemia    Past Surgical History:  Procedure Laterality Date  . ABDOMINAL HYSTERECTOMY    . DIAGNOSTIC LAPAROSCOPY     with lysis of adhesions   . ROTATOR CUFF REPAIR Right 2017   Social History   Socioeconomic History  . Marital status: Married    Spouse name: Not on file  . Number of children: 3  . Years of education: Not on file  . Highest education level: Not on file  Occupational History  . Occupation: CMA- Financial controller [employer]    Employer: [employer]  Tobacco Use  . Smoking status: Never Smoker  . Smokeless tobacco: Never Used  Vaping Use  . Vaping Use: Never used  Substance and Sexual Activity  . Alcohol use: Yes     Alcohol/week: 2.0 standard drinks    Types: 2 Glasses of wine per week  . Drug use: Jennifer [name]  . Sexual activity: Yes    Partners: Male    Birth control/protection: None  Other Topics Concern  . Not on file  Social History Narrative  . Not on file   Social Determinants of Health   Financial Resource Strain:   . Difficulty of Paying Living Expenses: Not on file  Food Insecurity:   . Worried About Charity fundraiser in the Last Year: Not on file  . Ran Out of Food in the Last Year: Not on file  Transportation Needs:   . Lack of Transportation (Medical): Not on file  . Lack of Transportation (Non-Medical): Not on file  Physical Activity:   . Days of Exercise per Week: Not on file  . Minutes of Exercise per Session: Not on file  Stress:   . Feeling of Stress : Not on file  Social Connections:   . Frequency of Communication with Friends and Family: Not on file  . Frequency of Social Gatherings with Friends and Family: Not on file  . Attends Religious Services: Not on file  . Active Member of Clubs or Organizations: Not on file  . Attends Archivist Meetings: Not on file  . Marital Status: Not on file   Allergies  Allergen Reactions  .  Codeine Nausea And Vomiting   Family History  Problem Relation Age of Onset  . Cancer Mother   . Early death Mother   . Diabetes Father   . Hyperlipidemia Father   . Heart disease Father   . Cancer Maternal Grandmother   . Cancer Paternal Grandmother     Current Outpatient Medications (Endocrine & Metabolic):  .  progesterone (PROMETRIUM) 100 MG capsule, Take 100 mg by mouth daily. .  progesterone (PROMETRIUM) 100 MG capsule, TAKE 1 CAPSULE (100 MG TOTAL) BY MOUTH DAILY.    Current Outpatient Medications (Analgesics):  .  acetaminophen (TYLENOL) 325 MG tablet, Take 2 tablets (650 mg total) by mouth every 6 (six) hours as needed for mild pain (or temp > 100). Marland Kitchen  ibuprofen (ADVIL,MOTRIN) 200 MG tablet, Take 400 mg by mouth every  6 (six) hours as needed for mild pain. Marland Kitchen  colchicine 0.6 MG tablet, Take 1 tablet (0.6 mg total) by mouth 2 (two) times daily.  Current Outpatient Medications (Hematological):  .  ferrous sulfate 325 (65 FE) MG tablet, Take 325 mg by mouth 3 (three) times a week.   Current Outpatient Medications (Other):  Marland Kitchen  Cholecalciferol (VITAMIN D-3) 1000 units CAPS, Take 1 capsule by mouth 3 (three) times a week.  .  famotidine (PEPCID) 20 MG tablet, Take 1 tablet (20 mg total) by mouth 2 (two) times daily. .  NONFORMULARY OR COMPOUNDED ITEM, Apply 3 drops topically every morning.  Marland Kitchen  PRESCRIPTION MEDICATION, Apply 5 drops topically at bedtime. Marland Kitchen  zolpidem (AMBIEN) 5 MG tablet, Take 1 tablet (5 mg total) by mouth at bedtime as needed for sleep. .  Vitamin D, Ergocalciferol, (DRISDOL) 1.25 MG (50000 UNIT) CAPS capsule, Take 1 capsule (50,000 Units total) by mouth every 7 (seven) days.   Reviewed prior external information including notes and imaging from  primary care provider As well as notes that were available from care everywhere and other healthcare systems.  Past medical history, social, surgical and family history all reviewed in electronic medical record.  Jennifer [name] pertanent information unless stated regarding to the chief complaint.   Review of Systems:  Jennifer [name] headache, visual changes, nausea, vomiting, diarrhea, constipation, dizziness, abdominal pain, skin rash, fevers, chills, night sweats, weight loss, swollen lymph nodes, body aches, joint swelling, chest pain, shortness of breath, mood changes. POSITIVE muscle aches  Objective  Blood pressure 120/80, pulse 78, height 5\' 4"  (1.626 m), weight 121 lb (54.9 kg), SpO2 98 %.   General: Jennifer [name] apparent distress alert and oriented x3 mood and affect normal, dressed appropriately.  HEENT: Pupils equal, extraocular movements intact  Respiratory: Patient's speak in full sentences and does not appear short of breath  Cardiovascular: Jennifer [name] lower extremity edema,  non tender, Jennifer [name] erythema  Neuro: Cranial nerves II through XII are intact, neurovascularly intact in all extremities with 2+ DTRs and 2+ pulses.  Gait normal with good balance and coordination.  MSK: Low back exam does have some mild loss of lordosis, some tenderness to palpation of the paraspinal musculature, patient does have some tightness noted with FABER test right greater than left.  Mild pelvic shear noted on the right side.  Patient is tender to palpation over the tip of the coccyx noted.  Osteopathic findings  T7 extended rotated and side bent left L4 flexed rotated and side bent left Sacrum right on right     Impression and Recommendations:     The above documentation has been reviewed and is accurate and complete Alroy Dust  Koren Bound, DO

## 2020-07-02 ENCOUNTER — Encounter: Payer: Self-pay | Admitting: Family Medicine

## 2020-07-02 ENCOUNTER — Other Ambulatory Visit: Payer: Self-pay

## 2020-07-02 ENCOUNTER — Other Ambulatory Visit: Payer: Self-pay | Admitting: Family Medicine

## 2020-07-02 ENCOUNTER — Ambulatory Visit (INDEPENDENT_AMBULATORY_CARE_PROVIDER_SITE_OTHER): Payer: 59 | Admitting: Family Medicine

## 2020-07-02 VITALS — BP 120/80 | HR 78 | Ht 64.0 in | Wt 121.0 lb

## 2020-07-02 DIAGNOSIS — M533 Sacrococcygeal disorders, not elsewhere classified: Secondary | ICD-10-CM | POA: Diagnosis not present

## 2020-07-02 DIAGNOSIS — M999 Biomechanical lesion, unspecified: Secondary | ICD-10-CM | POA: Diagnosis not present

## 2020-07-02 MED ORDER — VITAMIN D (ERGOCALCIFEROL) 1.25 MG (50000 UNIT) PO CAPS: 50000.0000 [IU] | ORAL_CAPSULE | ORAL | 0 refills | Status: DC

## 2020-07-02 MED ORDER — COLCHICINE 0.6 MG PO TABS: 0.6000 mg | ORAL_TABLET | Freq: Two times a day (BID) | ORAL | 0 refills | Status: DC

## 2020-07-02 MED FILL — COLCHICINE 0.6 MG TABS: 0.6 | 5 days supply | Qty: 10 | Fill #0

## 2020-07-02 MED FILL — VIT D2 1.25 MG (50,000 UNIT: 1.25 MG | 84 days supply | Qty: 12 | Fill #0

## 2020-07-02 NOTE — Assessment & Plan Note (Signed)
   Decision today to treat with OMT was based on Physical Exam  After verbal consent patient was treated with HVLA, ME, techniques in  thoracic, lumbar and sacral areas,  Patient tolerated the procedure well with improvement in symptoms  Patient given exercises, stretches and lifestyle modifications  See medications in patient instructions if given  Patient will follow up in 4-8 weeks

## 2020-07-02 NOTE — Patient Instructions (Addendum)
Sorry for being late  Colchicine 2 times a day for 5 days  ONce weekly vtiamin D for 12 weeks, stop the daily  Exercises 3 times a week.  Have a great wedding  See me again in 4-5 weeks

## 2020-07-02 NOTE — Assessment & Plan Note (Signed)
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  Patient has lost weight recently and then decompressing patient does not have significant padding and has been sitting more often.  Encouraged her to stand more at work.  In addition to this we discussed colchicine.  Patient wanted to avoid any prednisone secondary to her son getting married and does not want to retain any water.  Patient's x-rays are reassuring but did start once weekly vitamin D in case there is any stress reaction that could be contributing.  History of low vitamin D.  Hopefully patient will respond well.  Follow-up with me again 4 to 6 weeks.  Attempted some manipulation

## 2020-07-15 DIAGNOSIS — H16142 Punctate keratitis, left eye: Secondary | ICD-10-CM | POA: Diagnosis not present

## 2020-07-23 DIAGNOSIS — T1512XA Foreign body in conjunctival sac, left eye, initial encounter: Secondary | ICD-10-CM | POA: Diagnosis not present

## 2020-07-28 ENCOUNTER — Encounter: Payer: Self-pay | Admitting: Family Medicine

## 2020-07-28 ENCOUNTER — Ambulatory Visit (INDEPENDENT_AMBULATORY_CARE_PROVIDER_SITE_OTHER): Payer: 59 | Admitting: Family Medicine

## 2020-07-28 ENCOUNTER — Other Ambulatory Visit: Payer: Self-pay | Admitting: Family Medicine

## 2020-07-28 ENCOUNTER — Other Ambulatory Visit (INDEPENDENT_AMBULATORY_CARE_PROVIDER_SITE_OTHER): Payer: 59

## 2020-07-28 ENCOUNTER — Other Ambulatory Visit: Payer: Self-pay

## 2020-07-28 VITALS — BP 140/90 | HR 79 | Ht 64.0 in | Wt 118.0 lb

## 2020-07-28 DIAGNOSIS — M999 Biomechanical lesion, unspecified: Secondary | ICD-10-CM | POA: Diagnosis not present

## 2020-07-28 DIAGNOSIS — M2550 Pain in unspecified joint: Secondary | ICD-10-CM

## 2020-07-28 DIAGNOSIS — M533 Sacrococcygeal disorders, not elsewhere classified: Secondary | ICD-10-CM

## 2020-07-28 DIAGNOSIS — M545 Low back pain, unspecified: Secondary | ICD-10-CM | POA: Diagnosis not present

## 2020-07-28 LAB — CBC WITH DIFFERENTIAL/PLATELET
Basophils Absolute: 0 10*3/uL (ref 0.0–0.1)
Basophils Relative: 0.1 % (ref 0.0–3.0)
Eosinophils Absolute: 0.1 10*3/uL (ref 0.0–0.7)
Eosinophils Relative: 1.6 % (ref 0.0–5.0)
HCT: 44.1 % (ref 36.0–46.0)
Hemoglobin: 14.8 g/dL (ref 12.0–15.0)
Lymphocytes Relative: 30.3 % (ref 12.0–46.0)
Lymphs Abs: 1.7 10*3/uL (ref 0.7–4.0)
MCHC: 33.6 g/dL (ref 30.0–36.0)
MCV: 90.8 fl (ref 78.0–100.0)
Monocytes Absolute: 0.5 10*3/uL (ref 0.1–1.0)
Monocytes Relative: 8.7 % (ref 3.0–12.0)
Neutro Abs: 3.3 10*3/uL (ref 1.4–7.7)
Neutrophils Relative %: 59.3 % (ref 43.0–77.0)
Platelets: 260 10*3/uL (ref 150.0–400.0)
RBC: 4.86 Mil/uL (ref 3.87–5.11)
RDW: 12.4 % (ref 11.5–15.5)
WBC: 5.5 10*3/uL (ref 4.0–10.5)

## 2020-07-28 LAB — IBC PANEL
Iron: 94 ug/dL (ref 42–145)
Saturation Ratios: 26.9 % (ref 20.0–50.0)
Transferrin: 250 mg/dL (ref 212.0–360.0)

## 2020-07-28 LAB — COMPREHENSIVE METABOLIC PANEL
ALT: 15 U/L (ref 0–35)
AST: 18 U/L (ref 0–37)
Albumin: 4.5 g/dL (ref 3.5–5.2)
Alkaline Phosphatase: 52 U/L (ref 39–117)
BUN: 20 mg/dL (ref 6–23)
CO2: 30 mEq/L (ref 19–32)
Calcium: 9.7 mg/dL (ref 8.4–10.5)
Chloride: 100 mEq/L (ref 96–112)
Creatinine, Ser: 0.85 mg/dL (ref 0.40–1.20)
GFR: 76.29 mL/min (ref >60.00)
Glucose, Bld: 89 mg/dL (ref 70–99)
Potassium: 3.8 mEq/L (ref 3.5–5.1)
Sodium: 138 mEq/L (ref 135–145)
Total Bilirubin: 0.4 mg/dL (ref 0.2–1.2)
Total Protein: 7.3 g/dL (ref 6.0–8.3)

## 2020-07-28 LAB — VITAMIN D 25 HYDROXY (VIT D DEFICIENCY, FRACTURES): VITD: 45.52 ng/mL (ref 30.00–100.00)

## 2020-07-28 LAB — SEDIMENTATION RATE: Sed Rate: 5 mm/hr (ref 0–30)

## 2020-07-28 MED ORDER — GABAPENTIN 100 MG PO CAPS: 200.0000 mg | ORAL_CAPSULE | Freq: Every day | ORAL | 0 refills | Status: DC

## 2020-07-28 MED FILL — GABAPENTIN 100 MG CAPSULE: 100 | 90 days supply | Qty: 180 | Fill #0

## 2020-07-28 NOTE — Progress Notes (Signed)
[facility] [address] [place] Phone: [phone] Subjective:   I Kandace Blitz am serving as a Education administrator for Dr. Hulan Saas.  This visit occurred during the SARS-CoV-2 public health emergency.  Safety protocols were in place, including screening questions prior to the visit, additional usage of staff PPE, and extensive cleaning of exam room while observing appropriate contact time as indicated for disinfecting solutions.   I'm seeing this patient by the request  of:  Elby Beck, FNP  CC: Low back pain follow-up  NTI:[id number]  Jennifer [name] is a 59 y.o. female coming in with complaint of back and neck pain. OMT 07/02/2020. Patient states she is following up on tailbone pain. Feeling about the same and states she has not noticed improvement. Still taking the vitamin D states colchicine hasn't made a difference.  Did feel OMT was helpful but only for back pain not for the coccyx   Medications patient has been prescribed: Patient was given colchicine as well as vitamin D  Taking: vitamin D          Reviewed prior external information including notes and imaging from previsou exam, outside providers and external EMR if available.   As well as notes that were available from care everywhere and other healthcare systems.  Past medical history, social, surgical and family history all reviewed in electronic medical record.  No pertanent information unless stated regarding to the chief complaint.   Past Medical History:  Diagnosis Date  . Bowel obstruction ([place])   . Fibroids   . GERD (gastroesophageal reflux disease)   . H/O small bowel obstruction   . Hyperlipidemia     Allergies  Allergen Reactions  . Codeine Nausea And Vomiting     Review of Systems:  No headache, visual changes, nausea, vomiting, diarrhea, constipation, dizziness, abdominal pain, skin rash, fevers, chills, night sweats, weight loss, swollen lymph  nodes, body aches, joint swelling, chest pain, shortness of breath, mood changes. POSITIVE muscle aches  Objective  Blood pressure 140/90, pulse 79, height 5\' 4"  (1.626 m), weight 118 lb (53.5 kg), SpO2 98 %.   General: No apparent distress alert and oriented x3 mood and affect normal, dressed appropriately.  HEENT: Pupils equal, extraocular movements intact  Respiratory: Patient's speak in full sentences and does not appear short of breath  Cardiovascular: No lower extremity edema, non tender, no erythema  Neuro: Cranial nerves II through XII are intact, neurovascularly intact in all extremities with 2+ DTRs and 2+ pulses.  Gait normal with good balance and coordination.  MSK:  Non tender with full range of motion and good stability and symmetric strength and tone of shoulders, elbows, wrist, hip, knee and ankles bilaterally.  Back -low back does show patient has some tenderness in the paraspinal musculature of the lumbar spine negative straight leg test.  No tightness with FABER test.  Pain no in the paraspinal musculature in the piriformis bilaterally.  Mild pain over the coccyx still.  Osteopathic findings   T9 extended rotated and side bent left L2 flexed rotated and side bent right Sacrum right on right       Assessment and Plan:  Nontraumatic coccydynia Continued pain.  Patient on purpose has had weight loss     Nonallopathic problems  Decision today to treat with OMT was based on Physical Exam  After verbal consent patient was treated with HVLA, ME, FPR techniques in lumbar, and sacral  areas  Patient tolerated  the procedure well with improvement in symptoms  Patient given exercises, stretches and lifestyle modifications  See medications in patient instructions if given  Patient will follow up in 4-8 weeks     The above documentation has been reviewed and is accurate and complete Lyndal Pulley, DO       Note: This dictation was prepared with Dragon  dictation along with smaller phrase technology. Any transcriptional errors that result from this process are unintentional.

## 2020-07-28 NOTE — Patient Instructions (Addendum)
Gabapentin 100mg  at night Labs at [facility] lumbar at [facility] See me in 6 weeks

## 2020-07-28 NOTE — Assessment & Plan Note (Addendum)
Continued pain.  Patient on purpose has had weight loss and neck pain contributing to some of this.  Patient did not respond well to the colchicine.  Patient states that if she does take the ibuprofen she does seem to be better but patient did have an elevation of her creatinine at last lab exam.  We will order labs again to further evaluate.  If patient does do relatively well we could do a potentially low-dose meloxicam.  Encouraged her to also try gabapentin at night which was prescribed today.  X-rays of the lumbar spine ordered just to make sure there is no other signs that would be more of a radicular symptoms.  Follow-up with me again 6 to 8 weeks.

## 2020-07-29 ENCOUNTER — Ambulatory Visit (INDEPENDENT_AMBULATORY_CARE_PROVIDER_SITE_OTHER)
Admission: RE | Admit: 2020-07-29 | Discharge: 2020-07-29 | Disposition: A | Discharge status: Home / Self Care | Payer: 59 | Source: Ambulatory Visit | Attending: Family Medicine | Admitting: Family Medicine

## 2020-07-29 DIAGNOSIS — M545 Low back pain, unspecified: Secondary | ICD-10-CM

## 2020-07-30 ENCOUNTER — Encounter: Payer: Self-pay | Admitting: Family Medicine

## 2020-07-30 ENCOUNTER — Other Ambulatory Visit: Payer: Self-pay

## 2020-07-30 MED ORDER — MELOXICAM 7.5 MG PO TABS: 7.5000 mg | ORAL_TABLET | Freq: Every day | ORAL | 0 refills | Status: DC

## 2020-07-30 MED FILL — MELOXICAM 7.5 MG TABLET: 7.5 | 30 days supply | Qty: 30 | Fill #0

## 2020-08-11 ENCOUNTER — Other Ambulatory Visit: Payer: Self-pay | Admitting: Family Medicine

## 2020-08-11 MED FILL — FAMOTIDINE 20 MG TABLET: 20 | 90 days supply | Qty: 180 | Fill #0

## 2020-08-11 NOTE — Telephone Encounter (Signed)
Pharmacy requests refill on: Famotidine 20 mg   LAST REFILL: 03/22/2019 (Q-180, R-3)  LAST OV: 04/20/2020 NEXT OV: Not Scheduled  PHARMACY: [place]

## 2020-08-21 DIAGNOSIS — L570 Actinic keratosis: Secondary | ICD-10-CM | POA: Diagnosis not present

## 2020-08-21 DIAGNOSIS — Z419 Encounter for procedure for purposes other than remedying health state, unspecified: Secondary | ICD-10-CM | POA: Diagnosis not present

## 2020-08-21 MED FILL — FLUOROURACIL 5 % CREA: 5 | 14 days supply | Qty: 40 | Fill #0

## 2020-09-02 ENCOUNTER — Other Ambulatory Visit: Payer: Self-pay | Admitting: Family Medicine

## 2020-09-03 ENCOUNTER — Other Ambulatory Visit: Payer: Self-pay | Admitting: Family Medicine

## 2020-09-03 MED FILL — FAMOTIDINE 20 MG TABLET: 20 | 90 days supply | Qty: 180 | Fill #0

## 2020-09-03 MED FILL — PROGESTERONE 100 MG CAPS: 100 | 90 days supply | Qty: 90 | Fill #0

## 2020-09-03 NOTE — Telephone Encounter (Signed)
Pharmacy requests refill on: Progesterone 100 mg   LAST REFILL: 06/01/2020 (Q-90, R-0) LAST OV: 04/20/2020 NEXT OV: Not Scheduled  PHARMACY: [place]

## 2020-09-09 ENCOUNTER — Telehealth: Payer: Self-pay

## 2020-09-09 MED ORDER — SILVER SULFADIAZINE 1 % EX CREA: 1.0000 "application " | TOPICAL_CREAM | Freq: Two times a day (BID) | CUTANEOUS | 1 refills | Status: DC

## 2020-09-09 NOTE — Telephone Encounter (Signed)
Rx sent If not healing call back -

## 2020-09-09 NOTE — Telephone Encounter (Signed)
Patient dropped her curling iron on her back this am.  Burn is approximately 4 x 2 inches.  Center of burn has a gray area, that looks as if it is forming a large blister.  Patient is requesting silvadene cream called into [facility] [place].

## 2020-09-10 ENCOUNTER — Ambulatory Visit: Payer: 59 | Admitting: Family Medicine

## 2020-11-30 ENCOUNTER — Other Ambulatory Visit: Payer: Self-pay | Admitting: Physician Assistant

## 2020-11-30 DIAGNOSIS — Z1231 Encounter for screening mammogram for malignant neoplasm of breast: Secondary | ICD-10-CM

## 2020-12-05 MED FILL — PROGESTERONE 100 MG CAPS: 100 | 90 days supply | Qty: 90 | Fill #1

## 2020-12-16 ENCOUNTER — Ambulatory Visit
Admission: RE | Admit: 2020-12-16 | Discharge: 2020-12-16 | Disposition: A | Discharge status: Home / Self Care | Payer: 59 | Source: Ambulatory Visit

## 2020-12-16 ENCOUNTER — Other Ambulatory Visit: Payer: Self-pay

## 2020-12-16 DIAGNOSIS — Z1231 Encounter for screening mammogram for malignant neoplasm of breast: Secondary | ICD-10-CM | POA: Diagnosis not present

## 2020-12-18 DIAGNOSIS — L821 Other seborrheic keratosis: Secondary | ICD-10-CM | POA: Diagnosis not present

## 2020-12-18 DIAGNOSIS — Z419 Encounter for procedure for purposes other than remedying health state, unspecified: Secondary | ICD-10-CM | POA: Diagnosis not present

## 2020-12-18 DIAGNOSIS — D225 Melanocytic nevi of trunk: Secondary | ICD-10-CM | POA: Diagnosis not present

## 2020-12-18 DIAGNOSIS — L570 Actinic keratosis: Secondary | ICD-10-CM | POA: Diagnosis not present

## 2020-12-20 ENCOUNTER — Encounter: Payer: Self-pay | Admitting: Physician Assistant

## 2021-01-01 ENCOUNTER — Encounter: Payer: 59 | Admitting: Physician Assistant

## 2021-01-01 DIAGNOSIS — H524 Presbyopia: Secondary | ICD-10-CM | POA: Diagnosis not present

## 2021-01-08 ENCOUNTER — Encounter: Payer: 59 | Admitting: Physician Assistant

## 2021-01-15 ENCOUNTER — Ambulatory Visit (INDEPENDENT_AMBULATORY_CARE_PROVIDER_SITE_OTHER): Payer: 59 | Admitting: Physician Assistant

## 2021-01-15 ENCOUNTER — Other Ambulatory Visit (HOSPITAL_COMMUNITY): Payer: Self-pay

## 2021-01-15 ENCOUNTER — Encounter: Payer: Self-pay | Admitting: Physician Assistant

## 2021-01-15 ENCOUNTER — Other Ambulatory Visit: Payer: Self-pay

## 2021-01-15 VITALS — BP 103/69 | HR 68 | Temp 98.0°F | Ht 64.0 in | Wt 127.2 lb

## 2021-01-15 DIAGNOSIS — L659 Nonscarring hair loss, unspecified: Secondary | ICD-10-CM | POA: Diagnosis not present

## 2021-01-15 DIAGNOSIS — Z79890 Hormone replacement therapy (postmenopausal): Secondary | ICD-10-CM | POA: Diagnosis not present

## 2021-01-15 DIAGNOSIS — G2581 Restless legs syndrome: Secondary | ICD-10-CM | POA: Diagnosis not present

## 2021-01-15 DIAGNOSIS — G4700 Insomnia, unspecified: Secondary | ICD-10-CM | POA: Diagnosis not present

## 2021-01-15 DIAGNOSIS — E8940 Asymptomatic postprocedural ovarian failure: Secondary | ICD-10-CM | POA: Diagnosis not present

## 2021-01-15 DIAGNOSIS — E559 Vitamin D deficiency, unspecified: Secondary | ICD-10-CM | POA: Diagnosis not present

## 2021-01-15 DIAGNOSIS — I499 Cardiac arrhythmia, unspecified: Secondary | ICD-10-CM

## 2021-01-15 LAB — VITAMIN D 25 HYDROXY (VIT D DEFICIENCY, FRACTURES): VITD: 37.4 ng/mL (ref 30.00–100.00)

## 2021-01-15 MED ORDER — ESTRADIOL 0.1 MG/GM VA CREA
TOPICAL_CREAM | VAGINAL | 0 refills | Status: DC
  Filled 2021-01-15: qty 42.5, 90d supply, fill #0

## 2021-01-15 MED ORDER — GABAPENTIN 100 MG PO CAPS
ORAL_CAPSULE | ORAL | 3 refills | Status: DC
Start: 2021-01-15 — End: 2021-05-25
  Filled 2021-01-15: qty 90, 30d supply, fill #0
  Filled 2021-02-15 – 2021-02-22 (×2): qty 90, 30d supply, fill #1
  Filled 2021-03-23: qty 90, 30d supply, fill #2
  Filled 2021-04-22: qty 90, 30d supply, fill #3

## 2021-01-15 NOTE — Progress Notes (Signed)
Jennifer [name] is a 60 y.o. female here for a follow up of a pre-existing problem.  History of Present Illness:   Chief Complaint  Patient presents with  . Transitions Of Care  . Restless Legs  . Alopecia    HPI   Restless legs  Has had for years. Gynecologist recommended oral progesterone for this and she is taking this without significant issues, except she has concerns about long term use of this. Has tried coming off of this but when she has her symptoms returned. Has not tried anything else for her symptoms. She does take ferrous sulfate daily. Last ferritin was around 63.   Hair thinning Has seen dermatology for this and they recommended increasing oral iron to help with iron stores. Has tried multiple biotin supplements. Was asked to trial rogaine but she didn't try it consistently because she felt like she couldn't do this consistently with her lifestyle. She has not had COVID. Sx have been going on for 5 years.   Insomnia Takes ambien 5 mg once or twice a year. Has never used more frequently than this. Denies any concerning side effects of medication.   Vit D deficiency Takes 1000 IU daily. Tolerates, but is wondering if her supplement is enough. Was prescribed 50,000 IU vitamin D weekly by Charlann Boxer but did not take consistently.   Irregular HR During exam today found to have skipped beats. She currently is asymptomatic. Denies: chest pain, SOB, sensation of palpitations.   Use of hormone replacement therapy Uses topical estrogen drops from her ob-gyn in [facility]. She typically has her hormone levels checked here and sent to them. She would like these checked today.   Past Medical History:  Diagnosis Date  . Bowel obstruction ([place])   . Fibroids   . GERD (gastroesophageal reflux disease)   . H/O small bowel obstruction   . Hyperlipidemia      Social History   Tobacco Use  . Smoking status: Never Smoker  . Smokeless tobacco: Never Used  Vaping Use  . Vaping Use:  Never used  Substance Use Topics  . Alcohol use: Yes    Alcohol/week: 2.0 standard drinks    Types: 2 Glasses of wine per week  . Drug use: No    Past Surgical History:  Procedure Laterality Date  . ABDOMINAL HYSTERECTOMY    . DIAGNOSTIC LAPAROSCOPY     with lysis of adhesions   . ROTATOR CUFF REPAIR Right 2017    Family History  Problem Relation Age of Onset  . Cancer Mother   . Early death Mother   . Diabetes Father   . Hyperlipidemia Father   . Heart disease Father   . Cancer Maternal Grandmother   . Cancer Paternal Grandmother   . Breast cancer Paternal Grandmother   . Breast cancer Maternal Aunt     Allergies  Allergen Reactions  . Codeine Nausea And Vomiting    Current Medications:   Current Outpatient Medications:  .  acetaminophen (TYLENOL) 325 MG tablet, Take 2 tablets (650 mg total) by mouth every 6 (six) hours as needed for mild pain (or temp > 100)., Disp: , Rfl:  .  CALCIUM CARBONATE ANTACID PO, Take 725 mg by mouth., Disp: , Rfl:  .  Cholecalciferol (VITAMIN D-3) 1000 units CAPS, Take 1 capsule by mouth 3 (three) times a week. , Disp: , Rfl:  .  estradiol (ESTRACE) 0.1 MG/GM vaginal cream, Apply intravaginally daily x 2 weeks, then decrease to 1 to  3 times weekly, Disp: 42.5 g, Rfl: 0 .  famotidine (PEPCID) 20 MG tablet, TAKE 1 TABLET BY MOUTH TWICE DAILY (Patient taking differently: Take by mouth daily.), Disp: 180 tablet, Rfl: 1 .  ferrous sulfate 325 (65 FE) MG tablet, Take 325 mg by mouth 3 (three) times a week. , Disp: , Rfl:  .  gabapentin (NEURONTIN) 100 MG capsule, Take 1 capsule daily, then increase nightly by 100 mg, to goal of 300 mg daily., Disp: 90 capsule, Rfl: 3 .  ibuprofen (ADVIL,MOTRIN) 200 MG tablet, Take 400 mg by mouth every 6 (six) hours as needed for mild pain., Disp: , Rfl:  .  NONFORMULARY OR COMPOUNDED ITEM, Apply 3 drops topically every morning. , Disp: , Rfl:  .  PRESCRIPTION MEDICATION, Apply 5 drops topically at bedtime., Disp:  , Rfl:  .  progesterone (PROMETRIUM) 100 MG capsule, TAKE 1 CAPSULE BY MOUTH DAILY, Disp: 90 capsule, Rfl: 2 .  silver sulfADIAZINE (SILVADENE) 1 % cream, Apply 1 application topically 2 (two) times daily., Disp: 50 g, Rfl: 1 .  zolpidem (AMBIEN) 5 MG tablet, TAKE 1 TABLET BY MOUTH AT BEDTIME AS NEEDED FOR SLEEP (Patient taking differently: Take by mouth at bedtime as needed. for sleep), Disp: 10 tablet, Rfl: 0   Review of Systems:   ROS  Negative unless otherwise specified per HPI.  Vitals:   Vitals:   01/15/21 1106  BP: 103/69  Pulse: 68  Temp: 98 F (36.7 C)  SpO2: 99%  Weight: 127 lb 4 oz (57.7 kg)  Height: 5\' 4"  (1.626 m)     Body mass index is 21.84 kg/m.  Physical Exam:   Physical Exam Vitals and nursing note reviewed.  Constitutional:      General: She is not in acute distress.    Appearance: She is well-developed. She is not ill-appearing or toxic-appearing.  Cardiovascular:     Rate and Rhythm: Normal rate and regular rhythm.  Extrasystoles are present.    Pulses: Normal pulses.     Heart sounds: Normal heart sounds, S1 normal and S2 normal.     Comments: No LE edema Pulmonary:     Effort: Pulmonary effort is normal.     Breath sounds: Normal breath sounds.  Skin:    General: Skin is warm and dry.  Neurological:     Mental Status: She is alert.     GCS: GCS eye subscore is 4. GCS verbal subscore is 5. GCS motor subscore is 6.  Psychiatric:        Speech: Speech normal.        Behavior: Behavior normal. Behavior is cooperative.     Assessment and Plan:   [name] was seen today for transitions of care, restless legs and alopecia.  Diagnoses and all orders for this visit:  Restless leg Discussed alternative treatments. We are going to stop progesterone and start 100 mg gabapentin with daily increase by 100 mg to goal of 300 mg daily. If unsuccessful, will trial requip. Continue oral iron - recommended every other day dosing.  Hair thinning Reviewed  iron and TSH labs. Provided reassurance. Potential improvement if we stop oral progesterone. Follow-up with derm if needed.  Irregular heart rate EKG tracing is personally reviewed.  EKG notes NSR with occasional PVC's. No acute changes.  Reviewed with patient. No need for further work up unless symptomatic. -     EKG 12-Lead  Insomnia, unspecified type Does not need ambien refill today. Discussed that she is using relatively  safely.  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 She will forward results to ob-gyn. -     Estradiol -     Progesterone -     Testosterone , Free and Total  Vitamin D deficiency Update Vit D level and provide recommendations accordingly. -     VITAMIN D 25 Hydroxy (Vit-D Deficiency, Fractures)  Other orders -     gabapentin (NEURONTIN) 100 MG capsule; Take 1 capsule daily, then increase nightly by 100 mg, to goal of 300 mg daily. -     estradiol (ESTRACE) 0.1 MG/GM vaginal cream; Apply intravaginally daily x 2 weeks, then decrease to 1 to 3 times weekly  Inda Coke, PA-C

## 2021-01-15 NOTE — Patient Instructions (Signed)
It was great to see you!  Decrease oral iron supplement to every other day. Stop oral progesterone. Let's start gabapentin 100 mg daily, increase nightly by 100 mg daily, to goal of 300 mg nightly. Give this a week to see if this helps your symptoms.  Update hormone labs today  EKG shows occasional PVCs -- these are typically benign and do not require further work-up unless you become symptomatic.  Premature Ventricular Contraction  A premature ventricular contraction (PVC) is a common kind of irregular heartbeat (arrhythmia). These contractions are extra heartbeats that start in the ventricles of the heart and occur too early in the normal sequence. During the PVC, the heart's normal electrical pathway is not used, so the beat is shorter and less effective. In most cases, these contractions come and go and do not require treatment.  What are the causes? Common causes of the condition include:  Smoking.  Drinking alcohol.  Certain medicines.  Some illegal drugs.  Stress.  Caffeine. Certain medical conditions can also cause PVCs:  Heart failure.  Heart attack, or coronary artery disease.  Heart valve problems.  Changes in minerals in the blood (electrolytes).  Low blood oxygen levels or high carbon dioxide levels. In many cases, the cause of this condition is not known. What are the signs or symptoms? The main symptom of this condition is fast or skipped heartbeats (palpitations). Other symptoms include:  Chest pain.  Shortness of breath.  Feeling tired.  Dizziness.  Difficulty exercising. In some cases, there are no symptoms. How is this diagnosed? This condition may be diagnosed based on:  Your medical history.  A physical exam. During the exam, the health care provider will check for irregular heartbeats.  Tests, such as: ? An ECG (electrocardiogram) to monitor the electrical activity of your heart. ? An ambulatory cardiac monitor. This device records  your heartbeats for 24 hours or more. ? Stress tests to see how exercise affects your heart rhythm and blood supply. ? An echocardiogram. This test uses sound waves (ultrasound) to produce an image of your heart. ? An electrophysiology study (EPS). This test checks for electrical problems in your heart. How is this treated? Treatment for this condition depends on any underlying conditions, the type of PVCs that you are having, and how much the symptoms are interfering with your daily life. Possible treatments include:  Avoiding things that cause premature contractions (triggers). These include caffeine and alcohol.  Taking medicines if symptoms are severe or if the extra heartbeats are frequent.  Getting treatment for underlying conditions that cause PVCs.  Having an implantable cardioverter defibrillator (ICD), if you are at risk for a serious arrhythmia. The ICD is a small device that is inserted into your chest to monitor your heartbeat. When it senses an irregular heartbeat, it sends a shock to bring the heartbeat back to normal.  Having a procedure to destroy the portion of the heart tissue that sends out abnormal signals (catheter ablation). In some cases, no treatment is required. Follow these instructions at home: Lifestyle  Do not use any products that contain nicotine or tobacco, such as cigarettes, e-cigarettes, and chewing tobacco. If you need help quitting, ask your health care provider.  Do not use illegal drugs.  Exercise regularly. Ask your health care provider what type of exercise is safe for you.  Try to get at least 7-9 hours of sleep each night, or as much as recommended by your health care provider.  Find healthy ways to manage  stress. Avoid stressful situations when possible. Alcohol use  Do not drink alcohol if: ? Your health care provider tells you not to drink. ? You are pregnant, may be pregnant, or are planning to become pregnant. ? Alcohol triggers your  episodes.  If you drink alcohol: ? Limit how much you use to:  0-1 drink a day for women.  0-2 drinks a day for men.  Be aware of how much alcohol is in your drink. In the [place] one drink equals one 12 oz bottle of beer (355 mL), one 5 oz glass of wine (148 mL), or one 1 oz glass of hard liquor (44 mL). General instructions  Take over-the-counter and prescription medicines only as told by your health care provider.  If caffeine triggers episodes of PVC, do not eat, drink, or use anything with caffeine in it.  Keep all follow-up visits as told by your health care provider. This is important. Contact a health care provider if you:  Feel palpitations. Get help right away if you:  Have chest pain.  Have shortness of breath.  Have sweating for no reason.  Have nausea and vomiting.  Become light-headed or you faint. Summary  A premature ventricular contraction (PVC) is a common kind of irregular heartbeat (arrhythmia).  In most cases, these contractions come and go and do not require treatment.  You may need to wear an ambulatory cardiac monitor. This records your heartbeats for 24 hours or more.  Treatment depends on any underlying conditions, the type of PVCs that you are having, and how much the symptoms are interfering with your daily life. This information is not intended to replace advice given to you by your health care provider. Make sure you discuss any questions you have with your health care provider. Document Revised: 05/24/2018 Document Reviewed: 05/24/2018 Elsevier Patient Education  2021 [employer].

## 2021-01-18 LAB — TESTOSTERONE, FREE & TOTAL
Free Testosterone: 3.6 pg/mL (ref 0.1–6.4)
Testosterone, Total, LC-MS-MS: 74 ng/dL — ABNORMAL HIGH (ref 2–45)

## 2021-01-18 LAB — ESTRADIOL: Estradiol: 44 pg/mL

## 2021-01-18 LAB — PROGESTERONE: Progesterone: 2 ng/mL

## 2021-02-15 ENCOUNTER — Other Ambulatory Visit (HOSPITAL_COMMUNITY): Payer: Self-pay

## 2021-02-22 ENCOUNTER — Other Ambulatory Visit (HOSPITAL_COMMUNITY): Payer: Self-pay

## 2021-03-23 ENCOUNTER — Other Ambulatory Visit (HOSPITAL_COMMUNITY): Payer: Self-pay

## 2021-04-22 ENCOUNTER — Other Ambulatory Visit (HOSPITAL_COMMUNITY): Payer: Self-pay

## 2021-05-12 ENCOUNTER — Encounter: Payer: Self-pay | Admitting: Physician Assistant

## 2021-05-21 ENCOUNTER — Other Ambulatory Visit: Payer: Self-pay | Admitting: Physician Assistant

## 2021-05-22 ENCOUNTER — Other Ambulatory Visit (HOSPITAL_COMMUNITY): Payer: Self-pay

## 2021-05-24 ENCOUNTER — Other Ambulatory Visit (HOSPITAL_COMMUNITY): Payer: Self-pay

## 2021-05-24 ENCOUNTER — Other Ambulatory Visit: Payer: Self-pay | Admitting: Physician Assistant

## 2021-05-25 ENCOUNTER — Other Ambulatory Visit (HOSPITAL_COMMUNITY): Payer: Self-pay

## 2021-05-25 MED FILL — Gabapentin Cap 100 MG: ORAL | 30 days supply | Qty: 90 | Fill #0 | Status: AC

## 2021-06-18 ENCOUNTER — Telehealth: Payer: 59 | Admitting: Physician Assistant

## 2021-06-18 DIAGNOSIS — B9689 Other specified bacterial agents as the cause of diseases classified elsewhere: Secondary | ICD-10-CM | POA: Diagnosis not present

## 2021-06-18 DIAGNOSIS — J208 Acute bronchitis due to other specified organisms: Secondary | ICD-10-CM

## 2021-06-18 MED ORDER — BENZONATATE 100 MG PO CAPS: 100.0000 mg | ORAL_CAPSULE | Freq: Three times a day (TID) | ORAL | 0 refills | Status: DC | PRN

## 2021-06-18 MED ORDER — PREDNISONE 20 MG PO TABS: 20.0000 mg | ORAL_TABLET | Freq: Every day | ORAL | 0 refills | Status: DC

## 2021-06-18 MED ORDER — AZITHROMYCIN 250 MG PO TABS: ORAL_TABLET | ORAL | 0 refills | Status: DC

## 2021-06-18 NOTE — Patient Instructions (Signed)
[name], thank you for joining Mar Daring, PA-C for today's virtual visit.  While this provider is not your primary care provider (PCP), if your PCP is located in our provider database this encounter information will be shared with them immediately following your visit.  Consent: (Patient) Jennifer [name] provided verbal consent for this virtual visit at the beginning of the encounter.  Current Medications:  Current Outpatient Medications:    azithromycin (ZITHROMAX) 250 MG tablet, Take 2 tablets PO on day one, and one tablet PO daily thereafter until completed., Disp: 6 tablet, Rfl: 0   benzonatate (TESSALON) 100 MG capsule, Take 1 capsule (100 mg total) by mouth 3 (three) times daily as needed., Disp: 30 capsule, Rfl: 0   predniSONE (DELTASONE) 20 MG tablet, Take 1 tablet (20 mg total) by mouth daily with breakfast., Disp: 5 tablet, Rfl: 0   acetaminophen (TYLENOL) 325 MG tablet, Take 2 tablets (650 mg total) by mouth every 6 (six) hours as needed for mild pain (or temp > 100)., Disp: , Rfl:    CALCIUM CARBONATE ANTACID PO, Take 725 mg by mouth., Disp: , Rfl:    Cholecalciferol (VITAMIN D-3) 1000 units CAPS, Take 1 capsule by mouth 3 (three) times a week. , Disp: , Rfl:    estradiol (ESTRACE) 0.1 MG/GM vaginal cream, Apply intravaginally daily x 2 weeks, then decrease to 1 to 3 times weekly, Disp: 42.5 g, Rfl: 0   famotidine (PEPCID) 20 MG tablet, TAKE 1 TABLET BY MOUTH TWICE DAILY (Patient taking differently: Take by mouth daily.), Disp: 180 tablet, Rfl: 1   ferrous sulfate 325 (65 FE) MG tablet, Take 325 mg by mouth 3 (three) times a week. , Disp: , Rfl:    gabapentin (NEURONTIN) 100 MG capsule, Take 1 capsule by mouth nightly, then increase nightly by 1 capsule to goal of 3 capsules., Disp: 90 capsule, Rfl: 3   ibuprofen (ADVIL,MOTRIN) 200 MG tablet, Take 400 mg by mouth every 6 (six) hours as needed for mild pain., Disp: , Rfl:    NONFORMULARY OR COMPOUNDED ITEM, Apply 3 drops  topically every morning. , Disp: , Rfl:    PRESCRIPTION MEDICATION, Apply 5 drops topically at bedtime., Disp: , Rfl:    progesterone (PROMETRIUM) 100 MG capsule, TAKE 1 CAPSULE BY MOUTH DAILY, Disp: 90 capsule, Rfl: 2   silver sulfADIAZINE (SILVADENE) 1 % cream, Apply 1 application topically 2 (two) times daily., Disp: 50 g, Rfl: 1   zolpidem (AMBIEN) 5 MG tablet, TAKE 1 TABLET BY MOUTH AT BEDTIME AS NEEDED FOR SLEEP (Patient taking differently: Take by mouth at bedtime as needed. for sleep), Disp: 10 tablet, Rfl: 0   Medications ordered in this encounter:  Meds ordered this encounter  Medications   azithromycin (ZITHROMAX) 250 MG tablet    Sig: Take 2 tablets PO on day one, and one tablet PO daily thereafter until completed.    Dispense:  6 tablet    Refill:  0    Order Specific Question:   Supervising Provider    Answer:   MILLER, BRIAN [[id number]]   benzonatate (TESSALON) 100 MG capsule    Sig: Take 1 capsule (100 mg total) by mouth 3 (three) times daily as needed.    Dispense:  30 capsule    Refill:  0    Order Specific Question:   Supervising Provider    Answer:   MILLER, BRIAN [[id number]]   predniSONE (DELTASONE) 20 MG tablet    Sig: Take 1 tablet (  20 mg total) by mouth daily with breakfast.    Dispense:  5 tablet    Refill:  0    Order Specific Question:   Supervising Provider    Answer:   Sabra Heck, Nelson     *If you need refills on other medications prior to your next appointment, please contact your pharmacy*  Follow-Up: Call back or seek an in-person evaluation if the symptoms worsen or if the condition fails to improve as anticipated.  Other Instructions Acute Bronchitis, Adult Acute bronchitis is sudden or acute swelling of the air tubes (bronchi) in the lungs. Acute bronchitis causes these tubes to fill with mucus, which can make it hard to breathe. It can also cause coughing or wheezing. In adults, acute bronchitis usually goes away within 2 weeks. A cough caused by  bronchitis may last up to 3 weeks. Smoking, allergies, and asthma can make the condition worse. What are the causes? This condition can be caused by germs and by substances that irritate the lungs, including: Cold and flu viruses. The most common cause of this condition is the virus that causes the common cold. Bacteria. Substances that irritate the lungs, including: Smoke from cigarettes and other forms of tobacco. Dust and pollen. Fumes from chemical products, gases, or burned fuel. Other materials that pollute indoor or outdoor air. Close contact with someone who has acute bronchitis. What increases the risk? The following factors may make you more likely to develop this condition: A weak body's defense system, also called the immune system. A condition that affects your lungs and breathing, such as asthma. What are the signs or symptoms? Common symptoms of this condition include: Lung and breathing problems, such as: Coughing. This may bring up clear, yellow, or green mucus from your lungs (sputum). Wheezing. Having too much mucus in your lungs (chest congestion). Having shortness of breath. A fever. Chills. Aches and pains, including: Tightness in your chest and other body aches. A sore throat. How is this diagnosed? This condition is usually diagnosed based on: Your symptoms and medical history. A physical exam. You may also have other tests, including tests to rule out other conditions, such as pneumonia. These tests include: A test of lung function. Test of a mucus sample to look for the presence of bacteria. Tests to check the oxygen level in your blood. Blood tests. Chest X-ray. How is this treated? Most cases of acute bronchitis clear up over time without treatment. Your health care provider may recommend: Drinking more fluids. This can thin your mucus, which may improve your breathing. Using a device that gets medicine into your lungs (inhaler) to help improve  breathing and control coughing. Using a vaporizer or a humidifier. These are machines that add water to the air to help you breathe better. Taking a medicine for a fever. Taking a medicine that thins mucus and clears congestion (expectorant). Taking a medicine that prevents or stops coughing (cough suppressant). Follow these instructions at home: Activity Get plenty of rest. Return to your normal activities as told by your health care provider. Ask your health care provider what activities are safe for you. Lifestyle  Drink enough fluid to keep your urine pale yellow. Do not drink alcohol. Do not use any products that contain nicotine or tobacco, such as cigarettes, e-cigarettes, and chewing tobacco. If you need help quitting, ask your health care provider. Be aware that: Your bronchitis will get worse if you smoke or breathe in other people's smoke (secondhand smoke). Your lungs  will heal faster if you quit smoking. General instructions Take over-the-counter and prescription medicines only as told by your health care provider. Use an inhaler, vaporizer, or humidifier as told by your health care provider. If you have a sore throat, gargle with a salt-water mixture 3-4 times a day or as needed. To make a salt-water mixture, completely dissolve -1 tsp (3-6 g) of salt in 1 cup (237 mL) of warm water. Take two teaspoons of honey at bedtime to lessen coughing at night. Keep all follow-up visits as told by your health care provider. This is important. How is this prevented? To lower your risk of getting this condition again: Wash your hands often with soap and water. If soap and water are not available, use hand sanitizer. Avoid contact with people who have cold symptoms. Try not to touch your mouth, nose, or eyes with your hands. Avoid places where there are fumes from chemicals. Breathing these fumes will make your condition worse. Get the flu shot every year. Contact a health care provider  if: Your symptoms do not improve after 2 weeks of treatment. You vomit more than once or twice. You have symptoms of dehydration such as: Dark urine. Dry skin or eyes. Increased thirst. Headaches. Confusion. Muscle cramps. Get help right away if you: Cough up blood. Feel pain in your chest. Have severe shortness of breath. Faint or keep feeling like you are going to faint. Have a severe headache. Have fever or chills that get worse. These symptoms may represent a serious problem that is an emergency. Do not wait to see if the symptoms will go away. Get medical help right away. Call your local emergency services (911 in the [place] Do not drive yourself to the hospital. Summary Acute bronchitis is sudden (acute) inflammation of the air tubes (bronchi) between the windpipe and the lungs. In adults, acute bronchitis usually goes away within 2 weeks, although coughing may last 3 weeks or longer. Take over-the-counter and prescription medicines only as told by your health care provider. Drink enough fluid to keep your urine pale yellow. Contact a health care provider if your symptoms do not improve after 2 weeks of treatment. Get help right away if you cough up blood, faint, or have chest pain or shortness of breath. This information is not intended to replace advice given to you by your health care provider. Make sure you discuss any questions you have with your health care provider. Document Revised: 07/29/2020 Document Reviewed: 03/22/2019 Elsevier Patient Education  2022 [employer].    If you have been instructed to have an in-person evaluation today at a local Urgent Care facility, please use the link below. It will take you to a list of all of our available [facility], including address, phone number and hours of operation. Please do not delay care.  [facility]  If you or a family member do not have a primary care provider, use the link below to  schedule a visit and establish care. When you choose a [facility] primary care physician or advanced practice provider, you gain a long-term partner in health. Find a Primary Care Provider  Learn more about [facility]'s in-office and virtual care options: [facility]

## 2021-06-18 NOTE — Progress Notes (Signed)
Virtual Visit Consent   [name], you are scheduled for a virtual visit with a [facility] provider today.     Just as with appointments in the office, your consent must be obtained to participate.  Your consent will be active for this visit and any virtual visit you may have with one of our providers in the next 365 days.     If you have a MyChart account, a copy of this consent can be sent to you electronically.  All virtual visits are billed to your insurance company just like a traditional visit in the office.    As this is a virtual visit, video technology does not allow for your provider to perform a traditional examination.  This may limit your provider's ability to fully assess your condition.  If your provider identifies any concerns that need to be evaluated in person or the need to arrange testing (such as labs, EKG, etc.), we will make arrangements to do so.     Although advances in technology are sophisticated, we cannot ensure that it will always work on either your end or our end.  If the connection with a video visit is poor, the visit may have to be switched to a telephone visit.  With either a video or telephone visit, we are not always able to ensure that we have a secure connection.     I need to obtain your verbal consent now.   Are you willing to proceed with your visit today?    Jennifer [name] has provided verbal consent on 06/18/2021 for a virtual visit (video or telephone).   Mar Daring, PA-C   Date: 06/18/2021 11:34 AM   Virtual Visit via Video Note   I, Mar Daring, connected with  RAINA [name]  ([id number], 15-Jun-1961) on 06/18/21 at 11:30 AM EDT by a video-enabled telemedicine application and verified that I am speaking with the correct person using two identifiers.  Location: Patient: Virtual Visit Location Patient: Home Provider: Virtual Visit Location Provider: Home Office   I discussed the limitations of evaluation and management by  telemedicine and the availability of in person appointments. The patient expressed understanding and agreed to proceed.    History of Present Illness: Jennifer [name] is a 60 y.o. who identifies as a female who was assigned female at birth, and is being seen today for cough.  HPI: Cough This is a new problem. The current episode started 1 to 4 weeks ago (wednesday last week, 06/09/21). The problem has been gradually worsening. The cough is Productive of sputum and productive of purulent sputum. Associated symptoms include chest pain, nasal congestion, postnasal drip, rhinorrhea and a sore throat. Pertinent negatives include no fever. She has tried OTC cough suppressant for the symptoms. The treatment provided no relief.   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   Pain of left hand 02/07/2019   Small bowel obstruction ([place]) 05/21/2018   Popliteus tendinitis of right lower extremity 08/01/2017   SBO (small bowel obstruction) ([place]) 05/14/2017   GERD (gastroesophageal reflux disease) 03/04/2017    Allergies:  Allergies  Allergen Reactions   Codeine Nausea And Vomiting   Medications:  Current Outpatient Medications:    azithromycin (  ZITHROMAX) 250 MG tablet, Take 2 tablets PO on day one, and one tablet PO daily thereafter until completed., Disp: 6 tablet, Rfl: 0   benzonatate (TESSALON) 100 MG capsule, Take 1 capsule (100 mg total) by mouth 3 (three) times daily as needed., Disp: 30 capsule, Rfl: 0   predniSONE (DELTASONE) 20 MG tablet, Take 1 tablet (20 mg total) by mouth daily with breakfast., Disp: 5 tablet, Rfl: 0   acetaminophen (TYLENOL) 325 MG tablet, Take 2 tablets (650 mg total) by mouth every 6 (six) hours as needed for  mild pain (or temp > 100)., Disp: , Rfl:    CALCIUM CARBONATE ANTACID PO, Take 725 mg by mouth., Disp: , Rfl:    Cholecalciferol (VITAMIN D-3) 1000 units CAPS, Take 1 capsule by mouth 3 (three) times a week. , Disp: , Rfl:    estradiol (ESTRACE) 0.1 MG/GM vaginal cream, Apply intravaginally daily x 2 weeks, then decrease to 1 to 3 times weekly, Disp: 42.5 g, Rfl: 0   famotidine (PEPCID) 20 MG tablet, TAKE 1 TABLET BY MOUTH TWICE DAILY (Patient taking differently: Take by mouth daily.), Disp: 180 tablet, Rfl: 1   ferrous sulfate 325 (65 FE) MG tablet, Take 325 mg by mouth 3 (three) times a week. , Disp: , Rfl:    gabapentin (NEURONTIN) 100 MG capsule, Take 1 capsule by mouth nightly, then increase nightly by 1 capsule to goal of 3 capsules., Disp: 90 capsule, Rfl: 3   ibuprofen (ADVIL,MOTRIN) 200 MG tablet, Take 400 mg by mouth every 6 (six) hours as needed for mild pain., Disp: , Rfl:    NONFORMULARY OR COMPOUNDED ITEM, Apply 3 drops topically every morning. , Disp: , Rfl:    PRESCRIPTION MEDICATION, Apply 5 drops topically at bedtime., Disp: , Rfl:    progesterone (PROMETRIUM) 100 MG capsule, TAKE 1 CAPSULE BY MOUTH DAILY, Disp: 90 capsule, Rfl: 2   silver sulfADIAZINE (SILVADENE) 1 % cream, Apply 1 application topically 2 (two) times daily., Disp: 50 g, Rfl: 1   zolpidem (AMBIEN) 5 MG tablet, TAKE 1 TABLET BY MOUTH AT BEDTIME AS NEEDED FOR SLEEP (Patient taking differently: Take by mouth at bedtime as needed. for sleep), Disp: 10 tablet, Rfl: 0  Observations/Objective: Patient is well-developed, well-nourished in no acute distress.  Resting comfortably at home.  Head is normocephalic, atraumatic.  No labored breathing.  Speech is clear and coherent with logical content.  Patient is alert and oriented at baseline.    Assessment and Plan: 1. Acute bacterial bronchitis - azithromycin (ZITHROMAX) 250 MG tablet; Take 2 tablets PO on day one, and one tablet PO daily thereafter until completed.   Dispense: 6 tablet; Refill: 0 - benzonatate (TESSALON) 100 MG capsule; Take 1 capsule (100 mg total) by mouth 3 (three) times daily as needed.  Dispense: 30 capsule; Refill: 0 - predniSONE (DELTASONE) 20 MG tablet; Take 1 tablet (20 mg total) by mouth daily with breakfast.  Dispense: 5 tablet; Refill: 0  - Worsening despite OTC medications.  - Will treat with zpak, prednisone and tessalon perles.  - Push fluids.  - Rest.  - Call or seek in person evaluation if worsening or not responding to treatments  Follow Up Instructions: I discussed the assessment and treatment plan with the patient. The patient was provided an opportunity to ask questions and all were answered. The patient agreed with the plan and demonstrated an understanding of the instructions.  A copy of instructions were sent to the patient via MyChart unless otherwise  noted below.    The patient was advised to call back or seek an in-person evaluation if the symptoms worsen or if the condition fails to improve as anticipated.  Time:  I spent 12 minutes with the patient via telehealth technology discussing the above problems/concerns.    Mar Daring, PA-C

## 2021-06-25 ENCOUNTER — Other Ambulatory Visit: Payer: Self-pay | Admitting: Physician Assistant

## 2021-06-25 ENCOUNTER — Other Ambulatory Visit (HOSPITAL_COMMUNITY): Payer: Self-pay

## 2021-06-25 ENCOUNTER — Telehealth: Payer: Self-pay

## 2021-06-25 MED FILL — Gabapentin Cap 100 MG: ORAL | 30 days supply | Qty: 90 | Fill #1 | Status: CN

## 2021-06-25 NOTE — Telephone Encounter (Signed)
States [name] recently sent request for gabapentin over.    Patient is requesting a 90 day supply instead of 30.

## 2021-06-26 ENCOUNTER — Other Ambulatory Visit (HOSPITAL_COMMUNITY): Payer: Self-pay

## 2021-06-26 MED FILL — Gabapentin Cap 100 MG: ORAL | 30 days supply | Qty: 90 | Fill #1 | Status: AC

## 2021-06-28 ENCOUNTER — Other Ambulatory Visit (HOSPITAL_COMMUNITY): Payer: Self-pay

## 2021-06-28 MED ORDER — GABAPENTIN 300 MG PO CAPS
300.0000 mg | ORAL_CAPSULE | Freq: Three times a day (TID) | ORAL | 1 refills | Status: DC
  Filled 2021-06-28: qty 90, 30d supply, fill #0

## 2021-06-28 MED ORDER — GABAPENTIN 100 MG PO CAPS
ORAL_CAPSULE | ORAL | 1 refills | Status: DC
  Filled 2021-06-28: qty 90, fill #0

## 2021-06-28 NOTE — Telephone Encounter (Signed)
Left detailed message on personal voicemail. I did send over new Rx for 90 days for 100 mg at night but if you are taking more please call me back.

## 2021-06-28 NOTE — Telephone Encounter (Signed)
Pt called back she is up to taking Gabapentin 300 mg at hs. Told her okay I will send new Rx for 300 mg. Pt verbalized understanding.

## 2021-07-13 ENCOUNTER — Encounter: Payer: Self-pay | Admitting: Physician Assistant

## 2021-07-19 DIAGNOSIS — M79645 Pain in left finger(s): Secondary | ICD-10-CM | POA: Diagnosis not present

## 2021-09-14 ENCOUNTER — Encounter: Payer: Self-pay | Admitting: Physician Assistant

## 2021-10-01 ENCOUNTER — Ambulatory Visit: Payer: 59 | Admitting: Physician Assistant

## 2021-10-01 ENCOUNTER — Other Ambulatory Visit (INDEPENDENT_AMBULATORY_CARE_PROVIDER_SITE_OTHER): Payer: 59

## 2021-10-01 ENCOUNTER — Other Ambulatory Visit (HOSPITAL_COMMUNITY): Payer: Self-pay

## 2021-10-01 ENCOUNTER — Other Ambulatory Visit: Payer: Self-pay

## 2021-10-01 ENCOUNTER — Encounter: Payer: Self-pay | Admitting: Physician Assistant

## 2021-10-01 ENCOUNTER — Ambulatory Visit (INDEPENDENT_AMBULATORY_CARE_PROVIDER_SITE_OTHER)
Admission: RE | Admit: 2021-10-01 | Discharge: 2021-10-01 | Disposition: A | Discharge status: Home / Self Care | Payer: 59 | Source: Ambulatory Visit | Attending: Physician Assistant | Admitting: Physician Assistant

## 2021-10-01 VITALS — BP 100/70 | HR 83 | Temp 98.1°F | Ht 64.0 in | Wt 133.4 lb

## 2021-10-01 DIAGNOSIS — F5104 Psychophysiologic insomnia: Secondary | ICD-10-CM | POA: Diagnosis not present

## 2021-10-01 DIAGNOSIS — Z79890 Hormone replacement therapy (postmenopausal): Secondary | ICD-10-CM

## 2021-10-01 DIAGNOSIS — E8940 Asymptomatic postprocedural ovarian failure: Secondary | ICD-10-CM

## 2021-10-01 DIAGNOSIS — G2581 Restless legs syndrome: Secondary | ICD-10-CM

## 2021-10-01 LAB — FERRITIN: Ferritin: 43.5 ng/mL (ref 10.0–291.0)

## 2021-10-01 MED ORDER — ROPINIROLE HCL 0.25 MG PO TABS
0.2500 mg | ORAL_TABLET | Freq: Every day | ORAL | 1 refills | Status: DC
  Filled 2021-10-01: qty 30, 30d supply, fill #0

## 2021-10-01 MED ORDER — ZOLPIDEM TARTRATE 5 MG PO TABS
ORAL_TABLET | Freq: Every evening | ORAL | 0 refills | Status: DC | PRN
  Filled 2021-10-01: qty 10, 10d supply, fill #0

## 2021-10-01 NOTE — Progress Notes (Addendum)
Jennifer [name] is a 61 y.o. female here for restless leg syndrome and insomnia.   History of Present Illness:   Chief Complaint  Patient presents with   restless leg symdrome   Insomnia    Pt has been having trouble sleeping for months.    RLS Pt was previously taking progesterone for this issue with no significant issues. During our previous visit on 01/15/21, we discussed coming off of her progesterone capsule 100 mg nightly and transitioning to gabapentin 300 mg daily. According to pt, she was so adamant about changing this medication due to talks with her sister about it possibly causing problems from prolonged use. Currently she reports that she has been compliant with this medication with no complications and has noticed great improvement. She is currently taking 5 drops of Progesterone drops at night and 3 Estrogen/Testosterone drops in the morning. She is taking ferrous sulfate supplement approximately twice weekly.  Insomnia Initially Jennifer [name] believed insomnia to be caused by restless leg syndrome, but realized this was not the case after changing her medication to gabapentin 300 mg three times daily. Currently she states that she has not been getting enough sleep for the past couple of months and it is becoming a real problem. For comparison, she was previously taking ambien 5 mg once or twice a year, but since transitioning to gabapentin she has been taking ambien 5 mg approximately 1-2 times a month, sometimes less. Denies increased stress, racing thoughts, changes in routine, or changes in environment.  Premature Surgical Menopause HRT Patient is wanting DEXA. She is also requesting her hormone labs be checked again today.  Past Medical History:  Diagnosis Date   Bowel obstruction ([facility])    Fibroids    GERD (gastroesophageal reflux disease)    H/O small bowel obstruction    Hyperlipidemia      Social History   Tobacco Use   Smoking status: Never   Smokeless tobacco: Never   Vaping Use   Vaping Use: Never used  Substance Use Topics   Alcohol use: Yes    Alcohol/week: 2.0 standard drinks    Types: 2 Glasses of wine per week   Drug use: No    Past Surgical History:  Procedure Laterality Date   ABDOMINAL HYSTERECTOMY     DIAGNOSTIC LAPAROSCOPY     with lysis of adhesions    ROTATOR CUFF REPAIR Right 2017    Family History  Problem Relation Age of Onset   Cancer Mother    Early death Mother    Diabetes Father    Hyperlipidemia Father    Heart disease Father    Cancer Maternal Grandmother    Cancer Paternal Grandmother    Breast cancer Paternal Grandmother    Breast cancer Maternal Aunt     Allergies  Allergen Reactions   Codeine Nausea And Vomiting    Current Medications:   Current Outpatient Medications:    acetaminophen (TYLENOL) 325 MG tablet, Take 2 tablets (650 mg total) by mouth every 6 (six) hours as needed for mild pain (or temp > 100)., Disp: , Rfl:    CALCIUM CARBONATE ANTACID PO, Take 725 mg by mouth., Disp: , Rfl:    Cholecalciferol (VITAMIN D-3) 1000 units CAPS, Take 1 capsule by mouth 3 (three) times a week. , Disp: , Rfl:    estradiol (ESTRACE) 0.1 MG/GM vaginal cream, Apply intravaginally daily x 2 weeks, then decrease to 1 to 3 times weekly, Disp: 42.5 g, Rfl: 0   ferrous sulfate  325 (65 FE) MG tablet, Take 325 mg by mouth 3 (three) times a week. , Disp: , Rfl:    gabapentin (NEURONTIN) 300 MG capsule, Take 1 capsule (300 mg total) by mouth 3 (three) times daily., Disp: 90 capsule, Rfl: 1   ibuprofen (ADVIL,MOTRIN) 200 MG tablet, Take 400 mg by mouth every 6 (six) hours as needed for mild pain., Disp: , Rfl:    PRESCRIPTION MEDICATION, Apply 5 drops topically at bedtime., Disp: , Rfl:    famotidine (PEPCID) 20 MG tablet, TAKE 1 TABLET BY MOUTH TWICE DAILY (Patient taking differently: Take by mouth daily.), Disp: 180 tablet, Rfl: 1   progesterone (PROMETRIUM) 100 MG capsule, TAKE 1 CAPSULE BY MOUTH DAILY, Disp: 90 capsule,  Rfl: 2   zolpidem (AMBIEN) 5 MG tablet, TAKE 1 TABLET BY MOUTH AT BEDTIME AS NEEDED FOR SLEEP (Patient taking differently: Take by mouth at bedtime as needed. for sleep), Disp: 10 tablet, Rfl: 0   Review of Systems:   Review of Systems  Psychiatric/Behavioral:  The patient has insomnia.    Vitals:   Vitals:   10/01/21 1336  BP: 100/70  Pulse: 83  Temp: 98.1 F (36.7 C)  TempSrc: Temporal  SpO2: 97%  Weight: 133 lb 6.1 oz (60.5 kg)  Height: 5\' 4"  (1.626 m)     Body mass index is 22.89 kg/m.  Physical Exam:   Physical Exam Vitals and nursing note reviewed.  Constitutional:      General: She is not in acute distress.    Appearance: She is well-developed. She is not ill-appearing or toxic-appearing.  Cardiovascular:     Rate and Rhythm: Normal rate and regular rhythm.     Pulses: Normal pulses.     Heart sounds: Normal heart sounds, S1 normal and S2 normal.  Pulmonary:     Effort: Pulmonary effort is normal.     Breath sounds: Normal breath sounds.  Skin:    General: Skin is warm and dry.  Neurological:     Mental Status: She is alert.     GCS: GCS eye subscore is 4. GCS verbal subscore is 5. GCS motor subscore is 6.  Psychiatric:        Speech: Speech normal.        Behavior: Behavior normal. Behavior is cooperative.    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  I, Inda Coke, [place], have reviewed all documentation for this visit. The documentation on 10/01/21 for the exam, diagnosis, procedures, and orders are all accurate and complete.   Inda Coke, PA-C

## 2021-10-01 NOTE — Progress Notes (Deleted)
Jennifer [name] is a 61 y.o. female here for a {New prob or follow up:31724}.   History of Present Illness:   Chief Complaint  Patient presents with   restless leg symdrome   Insomnia    Pt has been having trouble sleeping for months.     HPI    Past Medical History:  Diagnosis Date   Bowel obstruction ([facility])    Fibroids    GERD (gastroesophageal reflux disease)    H/O small bowel obstruction    Hyperlipidemia      Social History   Tobacco Use   Smoking status: Never   Smokeless tobacco: Never  Vaping Use   Vaping Use: Never used  Substance Use Topics   Alcohol use: Yes    Alcohol/week: 2.0 standard drinks    Types: 2 Glasses of wine per week   Drug use: No    Past Surgical History:  Procedure Laterality Date   ABDOMINAL HYSTERECTOMY     DIAGNOSTIC LAPAROSCOPY     with lysis of adhesions    ROTATOR CUFF REPAIR Right 2017    Family History  Problem Relation Age of Onset   Cancer Mother    Early death Mother    Diabetes Father    Hyperlipidemia Father    Heart disease Father    Cancer Maternal Grandmother    Cancer Paternal Grandmother    Breast cancer Paternal Grandmother    Breast cancer Maternal Aunt     Allergies  Allergen Reactions   Codeine Nausea And Vomiting    Current Medications:   Current Outpatient Medications:    acetaminophen (TYLENOL) 325 MG tablet, Take 2 tablets (650 mg total) by mouth every 6 (six) hours as needed for mild pain (or temp > 100)., Disp: , Rfl:    CALCIUM CARBONATE ANTACID PO, Take 725 mg by mouth., Disp: , Rfl:    Cholecalciferol (VITAMIN D-3) 1000 units CAPS, Take 1 capsule by mouth 3 (three) times a week. , Disp: , Rfl:    estradiol (ESTRACE) 0.1 MG/GM vaginal cream, Apply intravaginally daily x 2 weeks, then decrease to 1 to 3 times weekly, Disp: 42.5 g, Rfl: 0   ferrous sulfate 325 (65 FE) MG tablet, Take 325 mg by mouth 3 (three) times a week. , Disp: , Rfl:    gabapentin (NEURONTIN) 300 MG capsule, Take 1  capsule (300 mg total) by mouth 3 (three) times daily., Disp: 90 capsule, Rfl: 1   ibuprofen (ADVIL,MOTRIN) 200 MG tablet, Take 400 mg by mouth every 6 (six) hours as needed for mild pain., Disp: , Rfl:    PRESCRIPTION MEDICATION, Apply 5 drops topically at bedtime., Disp: , Rfl:    famotidine (PEPCID) 20 MG tablet, TAKE 1 TABLET BY MOUTH TWICE DAILY (Patient taking differently: Take by mouth daily.), Disp: 180 tablet, Rfl: 1   progesterone (PROMETRIUM) 100 MG capsule, TAKE 1 CAPSULE BY MOUTH DAILY, Disp: 90 capsule, Rfl: 2   zolpidem (AMBIEN) 5 MG tablet, TAKE 1 TABLET BY MOUTH AT BEDTIME AS NEEDED FOR SLEEP (Patient taking differently: Take by mouth at bedtime as needed. for sleep), Disp: 10 tablet, Rfl: 0   Review of Systems:   ROS  Vitals:   Vitals:   10/01/21 1336  BP: 100/70  Pulse: 83  Temp: 98.1 F (36.7 C)  TempSrc: Temporal  SpO2: 97%  Weight: 133 lb 6.1 oz (60.5 kg)  Height: 5\' 4"  (1.626 m)     Body mass index is 22.89 kg/m.  Physical  Exam:   Physical Exam  Assessment and Plan:   There are no diagnoses linked to this encounter.     ***  Inda Coke, PA-C

## 2021-10-01 NOTE — Patient Instructions (Signed)
It was great to see you!  Stop gabapentin Start 0.25 mg requip nightly 1-3 hours prior to bed Give this a few days to see if you respond well -- if not, message me and I will advise on increase  DEXA and labs ordered for [facility]  Take care,  Inda Coke PA-C

## 2021-10-02 ENCOUNTER — Encounter: Payer: Self-pay | Admitting: Physician Assistant

## 2021-10-04 DIAGNOSIS — M65312 Trigger thumb, left thumb: Secondary | ICD-10-CM | POA: Diagnosis not present

## 2021-10-04 DIAGNOSIS — M79642 Pain in left hand: Secondary | ICD-10-CM | POA: Diagnosis not present

## 2021-10-04 DIAGNOSIS — M79645 Pain in left finger(s): Secondary | ICD-10-CM | POA: Diagnosis not present

## 2021-10-06 LAB — PROGESTERONE: Progesterone: 1.3 ng/mL

## 2021-10-06 LAB — ESTRADIOL: Estradiol: <15 pg/mL

## 2021-10-06 LAB — TESTOSTERONE, FREE & TOTAL

## 2021-10-07 ENCOUNTER — Other Ambulatory Visit: Payer: Self-pay | Admitting: Physician Assistant

## 2021-10-07 ENCOUNTER — Other Ambulatory Visit (INDEPENDENT_AMBULATORY_CARE_PROVIDER_SITE_OTHER): Payer: 59

## 2021-10-07 DIAGNOSIS — Z79890 Hormone replacement therapy (postmenopausal): Secondary | ICD-10-CM

## 2021-10-07 DIAGNOSIS — E8940 Asymptomatic postprocedural ovarian failure: Secondary | ICD-10-CM | POA: Diagnosis not present

## 2021-10-07 LAB — TESTOSTERONE: Testosterone: 79.88 ng/dL — ABNORMAL HIGH (ref 15.00–40.00)

## 2021-10-18 ENCOUNTER — Other Ambulatory Visit: Payer: Self-pay | Admitting: Physician Assistant

## 2021-10-18 ENCOUNTER — Encounter: Payer: Self-pay | Admitting: Physician Assistant

## 2021-10-18 ENCOUNTER — Other Ambulatory Visit (HOSPITAL_COMMUNITY): Payer: Self-pay

## 2021-10-18 MED ORDER — ROPINIROLE HCL 0.5 MG PO TABS
0.5000 mg | ORAL_TABLET | Freq: Every day | ORAL | 1 refills | Status: DC
  Filled 2021-10-18: qty 30, 30d supply, fill #0
  Filled 2021-11-26: qty 30, 30d supply, fill #1

## 2021-10-18 NOTE — Telephone Encounter (Signed)
Please see message and advise 

## 2021-11-19 ENCOUNTER — Other Ambulatory Visit: Payer: Self-pay | Admitting: Physician Assistant

## 2021-11-19 DIAGNOSIS — Z1231 Encounter for screening mammogram for malignant neoplasm of breast: Secondary | ICD-10-CM

## 2021-11-26 ENCOUNTER — Other Ambulatory Visit (HOSPITAL_COMMUNITY): Payer: Self-pay

## 2021-11-30 ENCOUNTER — Encounter: Payer: Self-pay | Admitting: Physician Assistant

## 2021-12-03 DIAGNOSIS — M25659 Stiffness of unspecified hip, not elsewhere classified: Secondary | ICD-10-CM | POA: Diagnosis not present

## 2021-12-03 DIAGNOSIS — M5440 Lumbago with sciatica, unspecified side: Secondary | ICD-10-CM | POA: Diagnosis not present

## 2021-12-03 DIAGNOSIS — M9905 Segmental and somatic dysfunction of pelvic region: Secondary | ICD-10-CM | POA: Diagnosis not present

## 2021-12-03 DIAGNOSIS — M9903 Segmental and somatic dysfunction of lumbar region: Secondary | ICD-10-CM | POA: Diagnosis not present

## 2021-12-03 DIAGNOSIS — M9904 Segmental and somatic dysfunction of sacral region: Secondary | ICD-10-CM | POA: Diagnosis not present

## 2021-12-03 DIAGNOSIS — M7918 Myalgia, other site: Secondary | ICD-10-CM | POA: Diagnosis not present

## 2021-12-09 ENCOUNTER — Ambulatory Visit: Payer: 59 | Admitting: Physician Assistant

## 2021-12-09 ENCOUNTER — Encounter: Payer: Self-pay | Admitting: Physician Assistant

## 2021-12-09 ENCOUNTER — Other Ambulatory Visit: Payer: Self-pay | Admitting: Physician Assistant

## 2021-12-09 ENCOUNTER — Ambulatory Visit (HOSPITAL_BASED_OUTPATIENT_CLINIC_OR_DEPARTMENT_OTHER)
Admission: RE | Admit: 2021-12-09 | Discharge: 2021-12-09 | Disposition: A | Discharge status: Home / Self Care | Payer: 59 | Source: Ambulatory Visit | Attending: Physician Assistant | Admitting: Physician Assistant

## 2021-12-09 VITALS — BP 104/62 | HR 85 | Temp 97.7°F | Ht 64.0 in | Wt 135.0 lb

## 2021-12-09 DIAGNOSIS — R590 Localized enlarged lymph nodes: Secondary | ICD-10-CM

## 2021-12-09 DIAGNOSIS — E041 Nontoxic single thyroid nodule: Secondary | ICD-10-CM | POA: Diagnosis not present

## 2021-12-09 DIAGNOSIS — D709 Neutropenia, unspecified: Secondary | ICD-10-CM

## 2021-12-09 LAB — CBC WITH DIFFERENTIAL/PLATELET
Basophils Absolute: 0.1 10*3/uL (ref 0.0–0.1)
Basophils Relative: 2 % (ref 0.0–3.0)
Eosinophils Absolute: 0.2 10*3/uL (ref 0.0–0.7)
Eosinophils Relative: 5.9 % — ABNORMAL HIGH (ref 0.0–5.0)
HCT: 45.5 % (ref 36.0–46.0)
Hemoglobin: 15 g/dL (ref 12.0–15.0)
Lymphocytes Relative: 41.3 % (ref 12.0–46.0)
Lymphs Abs: 1.5 10*3/uL (ref 0.7–4.0)
MCHC: 32.9 g/dL (ref 30.0–36.0)
MCV: 90.2 fl (ref 78.0–100.0)
Monocytes Absolute: 0.4 10*3/uL (ref 0.1–1.0)
Monocytes Relative: 12.6 % — ABNORMAL HIGH (ref 3.0–12.0)
Neutro Abs: 1.3 10*3/uL — ABNORMAL LOW (ref 1.4–7.7)
Neutrophils Relative %: 38.2 % — ABNORMAL LOW (ref 43.0–77.0)
Platelets: 253 10*3/uL (ref 150.0–400.0)
RBC: 5.05 Mil/uL (ref 3.87–5.11)
RDW: 12.6 % (ref 11.5–15.5)
WBC: 3.5 10*3/uL — ABNORMAL LOW (ref 4.0–10.5)

## 2021-12-09 LAB — COMPREHENSIVE METABOLIC PANEL
ALT: 16 U/L (ref 0–35)
AST: 19 U/L (ref 0–37)
Albumin: 4.7 g/dL (ref 3.5–5.2)
Alkaline Phosphatase: 58 U/L (ref 39–117)
BUN: 13 mg/dL (ref 6–23)
CO2: 31 mEq/L (ref 19–32)
Calcium: 9.7 mg/dL (ref 8.4–10.5)
Chloride: 104 mEq/L (ref 96–112)
Creatinine, Ser: 0.81 mg/dL (ref 0.40–1.20)
GFR: 80.06 mL/min (ref >60.00)
Glucose, Bld: 98 mg/dL (ref 70–99)
Potassium: 4.5 mEq/L (ref 3.5–5.1)
Sodium: 143 mEq/L (ref 135–145)
Total Bilirubin: 0.4 mg/dL (ref 0.2–1.2)
Total Protein: 6.8 g/dL (ref 6.0–8.3)

## 2021-12-09 LAB — SEDIMENTATION RATE: Sed Rate: 5 mm/hr (ref 0–30)

## 2021-12-09 LAB — C-REACTIVE PROTEIN: CRP: <1 mg/dL (ref 0.5–20.0)

## 2021-12-09 MED ORDER — IOHEXOL 300 MG/ML  SOLN
100.0000 mL | Freq: Once | INTRAMUSCULAR | Status: AC | PRN
  Administered 2021-12-09: 75 mL via INTRAVENOUS

## 2021-12-09 NOTE — Progress Notes (Signed)
Jennifer [name] is a 61 y.o. female here for adenopathy. ? ?History of Present Illness:  ? ?Chief Complaint  ?Patient presents with  ? Adenopathy  ?  Pt stated that she has a swollen lymph node on the Lt side of her neck for the past 2 mos. She has not been sick or anything for it to come up.  ? ? ?HPI ? ?Cervical Lymphadenopathy  ?Denym presents with c/o swollen lymph node on left side of neck that has been onset for 2 months. Pt reports although she had noticed this upon initial onset, she thought she was coming down with a cold. It wasn't until she noticed it had been present for 2 months that she decided to consult a friend who is a physician on the matter. Based off of their advice, she decided to have this further evaluated.  Denies fever, chills, pain, unintentional weight loss, recent illness/sick contacts, known fhx of cancer, hemoptysis, or recent vaccinations.   ? ?Past Medical History:  ?Diagnosis Date  ? Bowel obstruction ([place])   ? Fibroids   ? GERD (gastroesophageal reflux disease)   ? H/O small bowel obstruction   ? Hyperlipidemia   ? ?  ?Social History  ? ?Tobacco Use  ? Smoking status: Never  ? Smokeless tobacco: Never  ?Vaping Use  ? Vaping Use: Never used  ?Substance Use Topics  ? Alcohol use: Yes  ?  Alcohol/week: 2.0 standard drinks  ?  Types: 2 Glasses of wine per week  ? Drug use: No  ? ? ?Past Surgical History:  ?Procedure Laterality Date  ? ABDOMINAL HYSTERECTOMY    ? DIAGNOSTIC LAPAROSCOPY    ? with lysis of adhesions   ? ROTATOR CUFF REPAIR Right 2017  ? ? ?Family History  ?Problem Relation Age of Onset  ? Cancer Mother   ? Early death Mother   ? Diabetes Father   ? Hyperlipidemia Father   ? Heart disease Father   ? Cancer Maternal Grandmother   ? Cancer Paternal Grandmother   ? Breast cancer Paternal Grandmother   ? Breast cancer Maternal Aunt   ? ? ?Allergies  ?Allergen Reactions  ? Codeine Nausea And Vomiting  ? ? ?Current Medications:  ? ?Current Outpatient Medications:  ?  acetaminophen  (TYLENOL) 325 MG tablet, Take 2 tablets (650 mg total) by mouth every 6 (six) hours as needed for mild pain (or temp > 100)., Disp: , Rfl:  ?  CALCIUM CARBONATE ANTACID PO, Take 725 mg by mouth., Disp: , Rfl:  ?  Cholecalciferol (VITAMIN D-3) 1000 units CAPS, Take 1 capsule by mouth 3 (three) times a week. , Disp: , Rfl:  ?  estradiol (ESTRACE) 0.1 MG/GM vaginal cream, Apply intravaginally daily x 2 weeks, then decrease to 1 to 3 times weekly, Disp: 42.5 g, Rfl: 0 ?  ferrous sulfate 325 (65 FE) MG tablet, Take 325 mg by mouth 3 (three) times a week. , Disp: , Rfl:  ?  ibuprofen (ADVIL,MOTRIN) 200 MG tablet, Take 400 mg by mouth every 6 (six) hours as needed for mild pain., Disp: , Rfl:  ?  PRESCRIPTION MEDICATION, Apply 5 drops topically at bedtime., Disp: , Rfl:  ?  rOPINIRole (REQUIP) 0.5 MG tablet, Take 1 tablet (0.5 mg total) by mouth at bedtime., Disp: 30 tablet, Rfl: 1 ?  zolpidem (AMBIEN) 5 MG tablet, TAKE 1 TABLET BY MOUTH AT BEDTIME AS NEEDED FOR SLEEP, Disp: 10 tablet, Rfl: 0 ?  famotidine (PEPCID) 20 MG tablet,  TAKE 1 TABLET BY MOUTH TWICE DAILY (Patient taking differently: Take by mouth daily.), Disp: 180 tablet, Rfl: 1  ? ?Review of Systems:  ? ?ROS ?Negative unless otherwise specified per HPI. ?Vitals:  ? ?Vitals:  ? 12/09/21 0853  ?BP: 104/62  ?Pulse: 85  ?Temp: 97.7 ?F (36.5 ?C)  ?SpO2: 95%  ?Weight: 135 lb (61.2 kg)  ?Height: '5\' 4"'$  (1.626 m)  ?   ?Body mass index is 23.17 kg/m?. ? ?Physical Exam:  ? ?Physical Exam ?Vitals and nursing note reviewed.  ?Constitutional:   ?   General: She is not in acute distress. ?   Appearance: She is well-developed. She is not ill-appearing or toxic-appearing.  ?Cardiovascular:  ?   Rate and Rhythm: Normal rate and regular rhythm.  ?   Pulses: Normal pulses.  ?   Heart sounds: Normal heart sounds, S1 normal and S2 normal.  ?Pulmonary:  ?   Effort: Pulmonary effort is normal.  ?   Breath sounds: Normal breath sounds.  ?Lymphadenopathy:  ?   Comments: Fixed cervical  lymphnode; non-tender  ?Skin: ?   General: Skin is warm and dry.  ?Neurological:  ?   Mental Status: She is alert.  ?   GCS: GCS eye subscore is 4. GCS verbal subscore is 5. GCS motor subscore is 6.  ?Psychiatric:     ?   Speech: Speech normal.     ?   Behavior: Behavior normal. Behavior is cooperative.  ? ? ?Assessment and Plan:  ? ?Left cervical lymphadenopathy ?Given chronicity of lymphnode, will obtain blood work and u/s ?Update labs, will make recommendations accordingly ?Follow up based on results ? ?I,Havlyn C Ratchford,acting as a scribe for [employer], PA.,have documented all relevant documentation on the behalf of Inda Coke, PA,as directed by  Inda Coke, PA while in the presence of Inda Coke, [place]. ? ?IInda Coke, PA, have reviewed all documentation for this visit. The documentation on 12/09/21 for the exam, diagnosis, procedures, and orders are all accurate and complete. ? ?Inda Coke, PA-C ? ?

## 2021-12-09 NOTE — Patient Instructions (Signed)
It was great to see you! ? ?We are ordering stat u/s -- please stay near your phone and someone will call you to schedule this. We are going to try to get done today. ? ?We are updating blood work also. ? ?Take care, ? ?Inda Coke PA-C  ?

## 2021-12-10 ENCOUNTER — Other Ambulatory Visit: Payer: Self-pay | Admitting: Physician Assistant

## 2021-12-10 ENCOUNTER — Other Ambulatory Visit (INDEPENDENT_AMBULATORY_CARE_PROVIDER_SITE_OTHER): Payer: 59

## 2021-12-10 DIAGNOSIS — D709 Neutropenia, unspecified: Secondary | ICD-10-CM | POA: Diagnosis not present

## 2021-12-10 DIAGNOSIS — R590 Localized enlarged lymph nodes: Secondary | ICD-10-CM

## 2021-12-10 LAB — CBC WITH DIFFERENTIAL/PLATELET
Basophils Absolute: 0.1 10*3/uL (ref 0.0–0.1)
Basophils Relative: 1.5 % (ref 0.0–3.0)
Eosinophils Absolute: 0.1 10*3/uL (ref 0.0–0.7)
Eosinophils Relative: 2.1 % (ref 0.0–5.0)
HCT: 44.6 % (ref 36.0–46.0)
Hemoglobin: 14.9 g/dL (ref 12.0–15.0)
Lymphocytes Relative: 38.7 % (ref 12.0–46.0)
Lymphs Abs: 1.8 10*3/uL (ref 0.7–4.0)
MCHC: 33.5 g/dL (ref 30.0–36.0)
MCV: 88.9 fl (ref 78.0–100.0)
Monocytes Absolute: 0.6 10*3/uL (ref 0.1–1.0)
Monocytes Relative: 12.4 % — ABNORMAL HIGH (ref 3.0–12.0)
Neutro Abs: 2.1 10*3/uL (ref 1.4–7.7)
Neutrophils Relative %: 45.3 % (ref 43.0–77.0)
Platelets: 248 10*3/uL (ref 150.0–400.0)
RBC: 5.02 Mil/uL (ref 3.87–5.11)
RDW: 12.4 % (ref 11.5–15.5)
WBC: 4.6 10*3/uL (ref 4.0–10.5)

## 2021-12-13 ENCOUNTER — Encounter: Payer: Self-pay | Admitting: *Deleted

## 2021-12-13 LAB — PATHOLOGIST SMEAR REVIEW

## 2021-12-13 NOTE — Progress Notes (Unsigned)
Jennifer Edouard, MD  Jennifer [name] ?OK for US guided core biopsy of enlarged left cervical LN. Set up w/ sedation. Works for [employer]   ?  ?   ?Previous Messages ?  ?----- Message -----  ?From: Jennifer [name]  ?Sent: 12/10/2021  12:52 PM EDT  ?To: [facility] Procedure Requests  ?Subject: US Biopsy                                      ? ? ? ?US Biopsy  ? ?Left cervical lymphadenopathy  ? ?Ct neck done 12/09/21  ? ?Morene Rankins [place], [place]  ?[phone]   ?

## 2021-12-14 ENCOUNTER — Other Ambulatory Visit (HOSPITAL_COMMUNITY): Payer: Self-pay | Admitting: Physician Assistant

## 2021-12-14 ENCOUNTER — Other Ambulatory Visit: Payer: Self-pay | Admitting: Radiology

## 2021-12-15 ENCOUNTER — Ambulatory Visit (HOSPITAL_COMMUNITY)
Admission: RE | Admit: 2021-12-15 | Discharge: 2021-12-15 | Disposition: A | Discharge status: Home / Self Care | Payer: 59 | Source: Ambulatory Visit | Attending: Physician Assistant | Admitting: Physician Assistant

## 2021-12-15 ENCOUNTER — Encounter (HOSPITAL_COMMUNITY): Payer: Self-pay

## 2021-12-15 DIAGNOSIS — E785 Hyperlipidemia, unspecified: Secondary | ICD-10-CM | POA: Insufficient documentation

## 2021-12-15 DIAGNOSIS — D219 Benign neoplasm of connective and other soft tissue, unspecified: Secondary | ICD-10-CM | POA: Diagnosis not present

## 2021-12-15 DIAGNOSIS — R110 Nausea: Secondary | ICD-10-CM | POA: Insufficient documentation

## 2021-12-15 DIAGNOSIS — M549 Dorsalgia, unspecified: Secondary | ICD-10-CM | POA: Insufficient documentation

## 2021-12-15 DIAGNOSIS — C801 Malignant (primary) neoplasm, unspecified: Secondary | ICD-10-CM | POA: Diagnosis not present

## 2021-12-15 DIAGNOSIS — C770 Secondary and unspecified malignant neoplasm of lymph nodes of head, face and neck: Secondary | ICD-10-CM | POA: Diagnosis not present

## 2021-12-15 DIAGNOSIS — G8929 Other chronic pain: Secondary | ICD-10-CM | POA: Insufficient documentation

## 2021-12-15 DIAGNOSIS — R590 Localized enlarged lymph nodes: Secondary | ICD-10-CM

## 2021-12-15 DIAGNOSIS — K219 Gastro-esophageal reflux disease without esophagitis: Secondary | ICD-10-CM | POA: Diagnosis not present

## 2021-12-15 LAB — CBC
HCT: 45.4 % (ref 36.0–46.0)
Hemoglobin: 15.5 g/dL — ABNORMAL HIGH (ref 12.0–15.0)
MCH: 30.1 pg (ref 26.0–34.0)
MCHC: 34.1 g/dL (ref 30.0–36.0)
MCV: 88.2 fL (ref 80.0–100.0)
Platelets: 252 10*3/uL (ref 150–400)
RBC: 5.15 MIL/uL — ABNORMAL HIGH (ref 3.87–5.11)
RDW: 11.9 % (ref 11.5–15.5)
WBC: 4.2 10*3/uL (ref 4.0–10.5)
nRBC: 0 % (ref 0.0–0.2)

## 2021-12-15 LAB — PROTIME-INR
INR: 1 (ref 0.8–1.2)
Prothrombin Time: 12.6 seconds (ref 11.4–15.2)

## 2021-12-15 MED ORDER — LIDOCAINE HCL 1 % IJ SOLN
INTRAMUSCULAR | Status: AC
  Administered 2021-12-15: 10 mL
  Filled 2021-12-15: qty 20

## 2021-12-15 MED ORDER — FENTANYL CITRATE (PF) 100 MCG/2ML IJ SOLN
INTRAMUSCULAR | Status: AC
  Filled 2021-12-15: qty 2

## 2021-12-15 MED ORDER — FENTANYL CITRATE (PF) 100 MCG/2ML IJ SOLN
INTRAMUSCULAR | Status: AC | PRN
  Administered 2021-12-15 (×2): 50 ug via INTRAVENOUS

## 2021-12-15 MED ORDER — SODIUM CHLORIDE 0.9 % IV SOLN: INTRAVENOUS | Status: DC

## 2021-12-15 MED ORDER — MIDAZOLAM HCL 2 MG/2ML IJ SOLN
INTRAMUSCULAR | Status: AC | PRN
  Administered 2021-12-15 (×2): 1 mg via INTRAVENOUS

## 2021-12-15 MED ORDER — HYDROCODONE-ACETAMINOPHEN 5-325 MG PO TABS: 1.0000 | ORAL_TABLET | ORAL | Status: DC | PRN

## 2021-12-15 MED ORDER — MIDAZOLAM HCL 2 MG/2ML IJ SOLN
INTRAMUSCULAR | Status: AC
  Filled 2021-12-15: qty 2

## 2021-12-15 NOTE — Consult Note (Signed)
?    ? ?Chief Complaint: ?Patient was seen in consultation today for image guided left cervical lymph node biopsy ? ?Referring Physician(s): ?Worley,Samantha ? ?Supervising Physician: Arne [place] ? ?Patient Status: [place] ? ?History of Present Illness: ?Jennifer [name] is a 61 y.o. female with past medical history of fibroids, GERD, prior small bowel obstruction, hyperlipidemia who  ?presents now with approximately 60-monthhistory of left cervical lymphadenopathy.  CT neck performed on 12/09/2021 revealed: ?Enlarged left level 2 lymph node 13.7 x 23.3 mm. This is consistent ?with the ultrasound earlier today. Based on size and location, this ?is suspicious for malignancy and may represent a metastatic lymph ?node. Recommend tissue sampling. ?  ?No pharyngeal mucosal mass. Recommend direct pharyngeal mucosal ?Evaluation ? ?She has no prior history of cancer (+ family hx cancer) and presents today for image guided left cervical lymph node biopsy for further evaluation. ? ?Past Medical History:  ?Diagnosis Date  ? Bowel [place] ([place]   ? Fibroids   ? GERD (gastroesophageal reflux disease)   ? H/O small bowel obstruction   ? Hyperlipidemia   ? ? ?Past Surgical History:  ?Procedure Laterality Date  ? ABDOMINAL HYSTERECTOMY    ? DIAGNOSTIC LAPAROSCOPY    ? with lysis of adhesions   ? ROTATOR CUFF REPAIR Right 2017  ? ? ?Allergies: ?Codeine ? ?Medications: ?Prior to Admission medications   ?Medication Sig Start Date End Date Taking? Authorizing Provider  ?acetaminophen (TYLENOL) 325 MG tablet Take 2 tablets (650 mg total) by mouth every 6 (six) hours as needed for mild pain (or temp > 100). 05/24/18   SJill Alexanders PA-C  ?CALCIUM CARBONATE ANTACID PO Take 725 mg by mouth.    [provider]  ?Cholecalciferol (VITAMIN D-3) 1000 units CAPS Take 1 capsule by mouth 3 (three) times a week.     [provider]  ?estradiol (ESTRACE) 0.1 MG/GM vaginal cream Apply intravaginally daily x 2 weeks,  then decrease to 1 to 3 times weekly 01/15/21   WInda Coke PA  ?famotidine (PEPCID) 20 MG tablet TAKE 1 TABLET BY MOUTH TWICE DAILY ?Patient taking differently: Take by mouth daily. 08/11/20 08/11/21  GElby Beck FNP  ?ferrous sulfate 325 (65 FE) MG tablet Take 325 mg by mouth 3 (three) times a week.     [provider]  ?ibuprofen (ADVIL,MOTRIN) 200 MG tablet Take 400 mg by mouth every 6 (six) hours as needed for mild pain.    [provider]  ?PRESCRIPTION MEDICATION Apply 5 drops topically at bedtime.    [provider]  ?rOPINIRole (REQUIP) 0.5 MG tablet Take 1 tablet (0.5 mg total) by mouth at bedtime. 10/18/21   WInda Coke PA  ?zolpidem (AMBIEN) 5 MG tablet TAKE 1 TABLET BY MOUTH AT BEDTIME AS NEEDED FOR SLEEP 10/01/21 03/30/22  WInda Coke PA  ?  ? ?Family History  ?Problem Relation Age of Onset  ? Cancer Mother   ? Early death Mother   ? Diabetes Father   ? Hyperlipidemia Father   ? Heart disease Father   ? Cancer Maternal Grandmother   ? Cancer Paternal Grandmother   ? Breast cancer Paternal Grandmother   ? Breast cancer Maternal Aunt   ? ? ?Social History  ? ?Socioeconomic History  ? Marital status: Married  ?  Spouse name: Not on file  ? Number of children: 3  ? Years of education: Not on file  ? Highest education level: Not on file  ?Occupational History  ?  Occupation: CMA- Financial controller GI  ?  Employer: [employer]  ?Tobacco Use  ? Smoking status: Never  ? Smokeless tobacco: Never  ?Vaping Use  ? Vaping Use: Never used  ?Substance and Sexual Activity  ? Alcohol use: Yes  ?  Alcohol/week: 2.0 standard drinks  ?  Types: 2 Glasses of wine per week  ? Drug use: No  ? Sexual activity: Yes  ?  Partners: Male  ?  Birth control/protection: None  ?Other Topics Concern  ? Not on file  ?Social History Narrative  ? CMA with [employer]  ? ?Social Determinants of Health  ? ?Financial Resource Strain: Not on file  ?Food Insecurity: Not on file  ?Transportation Needs: Not on  file  ?Physical Activity: Not on file  ?Stress: Not on file  ?Social Connections: Not on file  ? ? ? ? ?Review of Systems she denies fever, headache, chest pain, dyspnea, cough, abdominal pain, vomiting or bleeding.  She is anxious, she has chronic back pain, occasional nausea, mild left-sided neck tenderness ? ?Vital Signs: ?Vitals:  ? 12/15/21 1135  ?BP: 128/83  ?Pulse: 73  ?Resp: 18  ?Temp: 98 ?F (36.7 ?C)  ?SpO2: 97%  ? ? ? ? ?Physical Exam awake, alert.  Chest clear to auscultation bilaterally.  Heart with regular rate and rhythm.  Abdomen soft, positive bowel sounds, nontender.  No lower extremity edema.  Palpable left cervical adenopathy, minimal tenderness to palpation ? ?Imaging: ?CT Soft Tissue Neck W Contrast ? ?Result Date: 12/09/2021 ?CLINICAL DATA:  Left cervical lymphadenopathy EXAM: CT NECK WITH CONTRAST TECHNIQUE: Multidetector CT imaging of the neck was performed using the standard protocol following the bolus administration of intravenous contrast. RADIATION DOSE REDUCTION: This exam was performed according to the departmental dose-optimization program which includes automated exposure control, adjustment of the mA and/or kV according to patient size and/or use of iterative reconstruction technique. CONTRAST:  9m OMNIPAQUE IOHEXOL 300 MG/ML  SOLN COMPARISON:  Soft tissue ultrasound neck 12/09/2021 FINDINGS: Pharynx and larynx: Normal. No mass or swelling. Salivary glands: No inflammation, mass, or stone. Thyroid: Multiple thyroid nodules. Largest nodule on the right 9 mm. No further imaging necessary based on nodule size. (Ref: J Am Coll Radiol. 2015 Feb;12(2): 143-50). Lymph nodes: Enlarged lymph node in the left lateral neck. Left level 2 lymph node measures 13.7 x 23.3 mm with homogeneous enhancement. No necrosis. 10 mm right level 2 lymph node with homogeneous enhancement. Vascular: Normal vascular enhancement Limited intracranial: Negative Visualized orbits: Negative Mastoids and visualized  paranasal sinuses: Paranasal sinuses clear. Mastoid clear. Skeleton: Disc degeneration and spurring C4-5, C5-6, C6-7. No acute skeletal abnormality. Upper chest: Lung apices clear bilaterally Other: None IMPRESSION: Enlarged left level 2 lymph node 13.7 x 23.3 mm. This is consistent with the ultrasound earlier today. Based on size and location, this is suspicious for malignancy and may represent a metastatic lymph node. Recommend tissue sampling. No pharyngeal mucosal mass. Recommend direct pharyngeal mucosal evaluation. Electronically Signed   By: CFranchot GalloM.D.   On: 12/09/2021 15:48  ? ?[place]Soft Tissue Head/Neck (NON-THYROID) ? ?Result Date: 12/09/2021 ?CLINICAL DATA:  Persistent left cervical lymphadenopathy 2 months EXAM: ULTRASOUND OF HEAD/NECK SOFT TISSUES TECHNIQUE: Ultrasound examination of the head and neck soft tissues was performed in the area of clinical concern. COMPARISON:  None. FINDINGS: Scanning in the left neck demonstrates a hypoechoic mass in the left lateral neck. This is most consistent with a lymph node. The lymph node is homogeneous and hypoechoic. No  cystic change or calcification. The lymph node measures 13 mm in short axis dimension which is mildly enlarged. No other regional lymph nodes identified IMPRESSION: 13 mm lymph node left lateral neck. This lymph node is enlarged and could be due to metastatic disease. Recommend CT neck with contrast for further evaluation. Electronically Signed   By: Franchot Gallo M.D.   On: 12/09/2021 13:21   ? ?Labs: ? ?CBC: ?Recent Labs  ?  12/09/21 ?0945 12/10/21 ?1145  ?WBC 3.5* 4.6  ?HGB 15.0 14.9  ?HCT 45.5 44.6  ?PLT 253.0 248.0  ? ? ?COAGS: ?No results for input(s): INR, APTT in the last 8760 hours. ? ?BMP: ?Recent Labs  ?  12/09/21 ?0945  ?NA 143  ?K 4.5  ?CL 104  ?CO2 31  ?GLUCOSE 98  ?BUN 13  ?CALCIUM 9.7  ?CREATININE 0.81  ? ? ?LIVER FUNCTION TESTS: ?Recent Labs  ?  12/09/21 ?0945  ?BILITOT 0.4  ?AST 19  ?ALT 16  ?ALKPHOS 58  ?PROT 6.8   ?ALBUMIN 4.7  ? ? ?TUMOR MARKERS: ?No results for input(s): AFPTM, CEA, CA199, CHROMGRNA in the last 8760 hours. ? ?Assessment and Plan: ?61 y.o. female with past medical history of fibroids, GERD, prior smal

## 2021-12-16 ENCOUNTER — Telehealth: Payer: Self-pay | Admitting: Physician Assistant

## 2021-12-16 ENCOUNTER — Other Ambulatory Visit: Payer: Self-pay | Admitting: Physician Assistant

## 2021-12-16 ENCOUNTER — Ambulatory Visit (HOSPITAL_COMMUNITY)
Admission: RE | Admit: 2021-12-16 | Discharge: 2021-12-16 | Disposition: A | Discharge status: Home / Self Care | Payer: 59 | Source: Ambulatory Visit | Attending: Physician Assistant | Admitting: Physician Assistant

## 2021-12-16 DIAGNOSIS — C779 Secondary and unspecified malignant neoplasm of lymph node, unspecified: Secondary | ICD-10-CM | POA: Diagnosis not present

## 2021-12-16 DIAGNOSIS — Z90710 Acquired absence of both cervix and uterus: Secondary | ICD-10-CM | POA: Diagnosis not present

## 2021-12-16 DIAGNOSIS — C4442 Squamous cell carcinoma of skin of scalp and neck: Secondary | ICD-10-CM | POA: Diagnosis not present

## 2021-12-16 DIAGNOSIS — I700 Atherosclerosis of aorta: Secondary | ICD-10-CM | POA: Diagnosis not present

## 2021-12-16 DIAGNOSIS — I2510 Atherosclerotic heart disease of native coronary artery without angina pectoris: Secondary | ICD-10-CM | POA: Diagnosis not present

## 2021-12-16 DIAGNOSIS — K6389 Other specified diseases of intestine: Secondary | ICD-10-CM | POA: Diagnosis not present

## 2021-12-16 DIAGNOSIS — K3189 Other diseases of stomach and duodenum: Secondary | ICD-10-CM | POA: Diagnosis not present

## 2021-12-16 MED ORDER — IOHEXOL 300 MG/ML  SOLN
100.0000 mL | Freq: Once | INTRAMUSCULAR | Status: AC | PRN
Start: 2021-12-16 — End: 2021-12-16
  Administered 2021-12-16: 100 mL via INTRAVENOUS

## 2021-12-16 NOTE — Telephone Encounter (Signed)
Jennifer [name] has already spoken with her. ?

## 2021-12-16 NOTE — Telephone Encounter (Signed)
Suanne Marker with [facility] stated Dr. Melina Copa is needing to speak with [facility]. Please call [phone] ?

## 2021-12-17 DIAGNOSIS — M25659 Stiffness of unspecified hip, not elsewhere classified: Secondary | ICD-10-CM | POA: Diagnosis not present

## 2021-12-17 DIAGNOSIS — M9904 Segmental and somatic dysfunction of sacral region: Secondary | ICD-10-CM | POA: Diagnosis not present

## 2021-12-17 DIAGNOSIS — M9905 Segmental and somatic dysfunction of pelvic region: Secondary | ICD-10-CM | POA: Diagnosis not present

## 2021-12-17 DIAGNOSIS — M5440 Lumbago with sciatica, unspecified side: Secondary | ICD-10-CM | POA: Diagnosis not present

## 2021-12-17 DIAGNOSIS — M7918 Myalgia, other site: Secondary | ICD-10-CM | POA: Diagnosis not present

## 2021-12-17 DIAGNOSIS — M9903 Segmental and somatic dysfunction of lumbar region: Secondary | ICD-10-CM | POA: Diagnosis not present

## 2021-12-17 LAB — SURGICAL PATHOLOGY

## 2021-12-21 ENCOUNTER — Encounter: Payer: Self-pay | Admitting: Physician Assistant

## 2021-12-21 ENCOUNTER — Other Ambulatory Visit: Payer: Self-pay | Admitting: Physician Assistant

## 2021-12-21 ENCOUNTER — Ambulatory Visit (HOSPITAL_COMMUNITY): Payer: 59

## 2021-12-21 ENCOUNTER — Telehealth: Payer: Self-pay | Admitting: Hematology and Oncology

## 2021-12-21 ENCOUNTER — Other Ambulatory Visit: Payer: Self-pay

## 2021-12-21 DIAGNOSIS — C779 Secondary and unspecified malignant neoplasm of lymph node, unspecified: Secondary | ICD-10-CM

## 2021-12-21 NOTE — Progress Notes (Signed)
Oncology Nurse Navigator Documentation  ? ?Placed introductory call to new referral patient Jennifer [name].  ?Introduced myself as the H&N oncology nurse navigator that works with Dr. Chryl Heck and Dr. Isidore Moos to whom she has been referred by Inda Coke PA. She confirmed understanding of referral. ?Briefly explained my role as her navigator, provided my contact information.  ?I encouraged her to call with questions/concerns as she moves forward with appts and procedures.   ?She verbalized understanding of information provided, expressed appreciation for my call. ? ? ?Navigator Initial Assessment ?Employment Status: she is working ?Currently on FMLA / STD: no ?Living Situation: she lives with her husband ?Support System: husband, family ?PCP: Inda Coke PA ?PCD: ?Financial Concerns: not at this time.  ?Transportation Needs: no ?Sensory Deficits: no ?Language Barriers/Interpreter Needed:  no ?Ambulation Needs: no ?DME Used in Home: no ?Psychosocial Needs:  no ?Concerns/Needs Understanding Cancer:  addressed/answered by navigator to best of ability ?Self-Expressed Needs: no ? ? ?[name] RN, BSN, OCN ?Head & Neck Oncology Nurse Navigator ?[place] at [facility] ?Phone # [phone]  ?Fax # [phone]  ?  ?

## 2021-12-21 NOTE — Telephone Encounter (Signed)
Pt called back and confirmed appt on 4/21 with Dr. Chryl Heck.  ?

## 2021-12-21 NOTE — Telephone Encounter (Signed)
Scheduled appt per 4/6 referral. Called pt, no answer. Left msg with appt date and time. Requested for pt to call back to confirm appt.  ?

## 2021-12-22 ENCOUNTER — Other Ambulatory Visit: Payer: Self-pay

## 2021-12-22 DIAGNOSIS — C779 Secondary and unspecified malignant neoplasm of lymph node, unspecified: Secondary | ICD-10-CM

## 2021-12-22 NOTE — Progress Notes (Signed)
mbulatory ?

## 2021-12-23 DIAGNOSIS — C7989 Secondary malignant neoplasm of other specified sites: Secondary | ICD-10-CM | POA: Insufficient documentation

## 2021-12-23 DIAGNOSIS — C801 Malignant (primary) neoplasm, unspecified: Secondary | ICD-10-CM | POA: Diagnosis not present

## 2021-12-23 DIAGNOSIS — R590 Localized enlarged lymph nodes: Secondary | ICD-10-CM | POA: Diagnosis not present

## 2021-12-23 DIAGNOSIS — Z87891 Personal history of nicotine dependence: Secondary | ICD-10-CM | POA: Diagnosis not present

## 2021-12-27 DIAGNOSIS — M9904 Segmental and somatic dysfunction of sacral region: Secondary | ICD-10-CM | POA: Diagnosis not present

## 2021-12-27 DIAGNOSIS — M25659 Stiffness of unspecified hip, not elsewhere classified: Secondary | ICD-10-CM | POA: Diagnosis not present

## 2021-12-27 DIAGNOSIS — M5440 Lumbago with sciatica, unspecified side: Secondary | ICD-10-CM | POA: Diagnosis not present

## 2021-12-27 DIAGNOSIS — M9903 Segmental and somatic dysfunction of lumbar region: Secondary | ICD-10-CM | POA: Diagnosis not present

## 2021-12-27 DIAGNOSIS — M9905 Segmental and somatic dysfunction of pelvic region: Secondary | ICD-10-CM | POA: Diagnosis not present

## 2021-12-27 DIAGNOSIS — M7918 Myalgia, other site: Secondary | ICD-10-CM | POA: Diagnosis not present

## 2021-12-29 ENCOUNTER — Ambulatory Visit (HOSPITAL_COMMUNITY)
Admission: RE | Admit: 2021-12-29 | Discharge: 2021-12-29 | Disposition: A | Discharge status: Home / Self Care | Payer: 59 | Source: Ambulatory Visit | Attending: Physician Assistant | Admitting: Physician Assistant

## 2021-12-29 DIAGNOSIS — C779 Secondary and unspecified malignant neoplasm of lymph node, unspecified: Secondary | ICD-10-CM | POA: Insufficient documentation

## 2021-12-29 DIAGNOSIS — I2510 Atherosclerotic heart disease of native coronary artery without angina pectoris: Secondary | ICD-10-CM | POA: Diagnosis not present

## 2021-12-29 DIAGNOSIS — N281 Cyst of kidney, acquired: Secondary | ICD-10-CM | POA: Diagnosis not present

## 2021-12-29 DIAGNOSIS — N200 Calculus of kidney: Secondary | ICD-10-CM | POA: Diagnosis not present

## 2021-12-29 DIAGNOSIS — C760 Malignant neoplasm of head, face and neck: Secondary | ICD-10-CM | POA: Diagnosis not present

## 2021-12-29 LAB — GLUCOSE, CAPILLARY: Glucose-Capillary: 98 mg/dL (ref 70–99)

## 2021-12-29 MED ORDER — FLUDEOXYGLUCOSE F - 18 (FDG) INJECTION
6.1000 | Freq: Once | INTRAVENOUS | Status: AC
  Administered 2021-12-29: 6.1 via INTRAVENOUS

## 2021-12-30 ENCOUNTER — Other Ambulatory Visit: Payer: Self-pay | Admitting: Physician Assistant

## 2021-12-30 ENCOUNTER — Other Ambulatory Visit (HOSPITAL_COMMUNITY): Payer: Self-pay

## 2021-12-30 MED ORDER — ROPINIROLE HCL 0.5 MG PO TABS
0.5000 mg | ORAL_TABLET | Freq: Every day | ORAL | 1 refills | Status: DC
Start: 2021-12-30 — End: 2022-01-27
  Filled 2021-12-30: qty 30, 30d supply, fill #0

## 2021-12-31 ENCOUNTER — Inpatient Hospital Stay: Payer: 59

## 2021-12-31 ENCOUNTER — Inpatient Hospital Stay: Payer: 59 | Attending: Hematology and Oncology | Admitting: Hematology and Oncology

## 2021-12-31 ENCOUNTER — Encounter: Payer: Self-pay | Admitting: Hematology and Oncology

## 2021-12-31 ENCOUNTER — Other Ambulatory Visit: Payer: Self-pay

## 2021-12-31 VITALS — BP 129/77 | HR 81 | Temp 97.2°F | Resp 17 | Wt 131.4 lb

## 2021-12-31 DIAGNOSIS — C760 Malignant neoplasm of head, face and neck: Secondary | ICD-10-CM | POA: Diagnosis not present

## 2021-12-31 DIAGNOSIS — C779 Secondary and unspecified malignant neoplasm of lymph node, unspecified: Secondary | ICD-10-CM

## 2021-12-31 NOTE — Progress Notes (Signed)
[facility] ?CONSULT NOTE ? ?Patient Care Team: ?Inda Coke, [place] as PCP - General (Physician Assistant) ? ?CHIEF COMPLAINTS/PURPOSE OF CONSULTATION:  ?HPV positive SCC  ? ?ASSESSMENT & PLAN:  ? ?This is a very pleasant 61 year old female patient with T0N1M0 squamous cell carcinoma, p16 positive referred to medical oncology for recommendations.  Patient is very healthy at baseline except for history of endometriosis and past medical history of hysterectomy. ?She reports palpable lymph node for the past 2 months which has not significantly changed, new onset sore throat in the past 2 weeks.  Physical examination today palpable single cervical lymph node in the left neck measuring around 2 cm. ?We have discussed that she will have to proceed with biopsies by Dr. Constance Holster for evaluation of possible primary tumor.  If she is indeed stage I p16 positive squamous cell carcinoma, she would likely proceed with resection of primary and ipsilateral or bilateral neck dissection versus definitive radiotherapy versus rarely will need concurrent systemic therapy/RT.  I would recommend upfront surgery since she has excellent baseline performance status versus definitive RT alone.  If she does have any adverse pathologic features on surgery such as positive margin or extranodal extension, we will certainly recommend concurrent chemotherapy with radiation at that time. ? ?She can return to medical oncology as needed at this time.  She had several questions about surveillance, role of each modality of treatment, possible primary tumor location etc.  All her questions were answered to the best of my knowledge. ?Thank you for consulting [place] in the care of this patient.  Please not hesitate to contact us with any additional questions or concerns. ? ?HISTORY OF PRESENTING ILLNESS:  ? ?Jennifer [name] 60 y.o. female is here because of new diagnosis of head and neck cancer. ?Patient has noticed left-sided neck mass a few months  ago and went to see Dr. Constance Holster from otolaryngology.  She underwent an ultrasound followed by CT scan.  She had a core needle biopsy based on the CT recommendation which was positive for squamous cell carcinoma, p16 positive.  ? ?She had PET/CT on December 29, 2021 which once again showed hypermetabolic left cervical adenopathy.  No other evidence of abnormal hypermetabolic seen in the neck, chest, abdominal pelvis ? ?She is yet to see Dr Constance Holster for biopsies, and will see Dr Isidore Moos on Monday morning. ? ?She arrived to the appointment today with her husband.  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  She has recently noticed some sore throat in the past 2 weeks.  She denies any other symptoms.  She thinks the cold and sore throat that she had for the past 2 weeks is likely unrelated. ?She is otherwise healthy, had history of endometriosis and hysterectomy.  Rest of the pertinent 10 point ROS reviewed and negative. ? ?REVIEW OF SYSTEMS:   ?Constitutional: Denies fevers, chills or abnormal night sweats ?Eyes: Denies blurriness of vision, double vision or watery eyes ?Ears, nose, mouth, throat, and face: Denies mucositis or sore throat ?Respiratory: Denies cough, dyspnea or wheezes ?Cardiovascular: Denies palpitation, chest discomfort or lower extremity swelling ?Gastrointestinal:  Denies nausea, heartburn or change in bowel habits ?Skin: Denies abnormal skin rashes ?Lymphatics: Denies new lymphadenopathy or easy bruising ?Neurological:Denies numbness, tingling or new weaknesses ?Behavioral/Psych: Mood is stable, no new changes  ?All other systems were reviewed with the patient and are negative. ? ?MEDICAL HISTORY:  ?  Past Medical History:  ?Diagnosis Date  ? Bowel obstruction ([place])   ? Fibroids   ? GERD (gastroesophageal reflux disease)   ? H/O small bowel obstruction   ? Hyperlipidemia   ? ? ?SURGICAL HISTORY: ?Past Surgical  History:  ?Procedure Laterality Date  ? ABDOMINAL HYSTERECTOMY    ? DIAGNOSTIC LAPAROSCOPY    ? with lysis of adhesions   ? ROTATOR CUFF REPAIR Right 2017  ? ? ?SOCIAL HISTORY: ?Social History  ? ?Socioeconomic History  ? Marital status: Married  ?  Spouse name: Not on file  ? Number of children: 3  ? Years of education: Not on file  ? Highest education level: Not on file  ?Occupational History  ? Occupation: CMA- Financial controller GI  ?  Employer: [employer]  ?Tobacco Use  ? Smoking status: Never  ? Smokeless tobacco: Never  ?Vaping Use  ? Vaping Use: Never used  ?Substance and Sexual Activity  ? Alcohol use: Yes  ?  Alcohol/week: 2.0 standard drinks  ?  Types: 2 Glasses of wine per week  ? Drug use: No  ? Sexual activity: Yes  ?  Partners: Male  ?  Birth control/protection: None  ?Other Topics Concern  ? Not on file  ?Social History Narrative  ? CMA with [facility] [employer]  ? ?Social Determinants of Health  ? ?Financial Resource Strain: Not on file  ?Food Insecurity: Not on file  ?Transportation Needs: Not on file  ?Physical Activity: Not on file  ?Stress: Not on file  ?Social Connections: Not on file  ?Intimate Partner Violence: Not on file  ? ? ?FAMILY HISTORY: ?Family History  ?Problem Relation Age of Onset  ? Cancer Mother   ? Early death Mother   ? Diabetes Father   ? Hyperlipidemia Father   ? Heart disease Father   ? Cancer Maternal Grandmother   ? Cancer Paternal Grandmother   ? Breast cancer Paternal Grandmother   ? Breast cancer Maternal Aunt   ? ? ?ALLERGIES:  is allergic to codeine. ? ?MEDICATIONS:  ?Current Outpatient Medications  ?Medication Sig Dispense Refill  ? acetaminophen (TYLENOL) 325 MG tablet Take 2 tablets (650 mg total) by mouth every 6 (six) hours as needed for mild pain (or temp > 100).    ? CALCIUM CARBONATE ANTACID PO Take 725 mg by mouth.    ? Cholecalciferol (VITAMIN D-3) 1000 units CAPS Take 1 capsule by mouth 3 (three) times a week.     ? estradiol (ESTRACE) 0.1 MG/GM vaginal cream Apply  intravaginally daily x 2 weeks, then decrease to 1 to 3 times weekly 42.5 g 0  ? famotidine (PEPCID) 20 MG tablet TAKE 1 TABLET BY MOUTH TWICE DAILY (Patient taking differently: Take by mouth daily.) 180 tablet 1  ? ferrous sulfate 325 (65 FE) MG tablet Take 325 mg by mouth 3 (three) times a week.     ? ibuprofen (ADVIL,MOTRIN) 200 MG tablet Take 400 mg by mouth every 6 (six) hours as needed for mild pain.    ? PRESCRIPTION MEDICATION Apply 5 drops topically at bedtime.    ? rOPINIRole (REQUIP) 0.5 MG tablet Take 1 tablet (0.5 mg total) by mouth at bedtime. 30 tablet 1  ? zolpidem (AMBIEN) 5 MG tablet TAKE 1 TABLET BY MOUTH AT BEDTIME AS NEEDED FOR SLEEP 10 tablet 0  ? ?No current facility-administered medications for this visit.  ? ? ? ?PHYSICAL EXAMINATION: ?ECOG PERFORMANCE STATUS: 0 - Asymptomatic ? ?Vitals:  ? 12/31/21 1047  ?BP:  129/77  ?Pulse: 81  ?Resp: 17  ?Temp: (!) 97.2 ?F (36.2 ?C)  ?SpO2: 100%  ? ?Filed Weights  ? 12/31/21 1047  ?Weight: 131 lb 6 oz (59.6 kg)  ? ? ?GENERAL:alert, no distress and comfortable ?SKIN: skin color, texture, turgor are normal, no rashes or significant lesions ?EYES: normal, conjunctiva are pink and non-injected, sclera clear ?OROPHARYNX:no exudate, no erythema and lips, buccal mucosa, and tongue normal  ?NECK: supple, thyroid normal size, non-tender, without nodularity ?LYMPH: Palpable left cervical lymph node measuring under 2 cm.  No other palpable lymphadenopathy.  No other lesions noted.  No skin lesions noted.   ?LUNGS: clear to auscultation and percussion with normal breathing effort ?HEART: regular rate & rhythm and no murmurs and no lower extremity edema ?ABDOMEN:abdomen soft, non-tender and normal bowel sounds ?Musculoskeletal:no cyanosis of digits and no clubbing  ?PSYCH: alert & oriented x 3 with fluent speech ?NEURO: no focal motor/sensory deficits ? ?LABORATORY DATA:  ?I have reviewed the data as listed ?Lab Results  ?Component Value Date  ? WBC 4.2 12/15/2021  ?  HGB 15.5 (H) 12/15/2021  ? HCT 45.4 12/15/2021  ? MCV 88.2 12/15/2021  ? PLT 252 12/15/2021  ? ?  Chemistry   ?   ?Component Value Date/Time  ? NA 143 12/09/2021 0945  ? K 4.5 12/09/2021 0945  ? CL 104 12/10/18

## 2021-12-31 NOTE — Progress Notes (Signed)
Head and Neck Cancer Location of Tumor / Histology:  ?Squamous cell carcinoma of left cervical lymph node, p16(+) ? ?Patient presented with symptoms of: noticed a left-sided neck mass a few months ago. She had no other symptoms related to this. She underwent an ultrasound followed by a CT scan.  ? ?PET Scan ?12/29/2021 ?--IMPRESSION: ?Hypermetabolic left cervical adenopathy. No additional evidence of abnormal hypermetabolism in the neck, chest, abdomen or pelvis. ?Tiny left renal stones. ?Aortic atherosclerosis (ICD10-I70.0). Coronary artery calcification. ? ?CT Neck w/ Contrast ?12/09/2021 ?--IMPRESSION: ?Enlarged left level 2 lymph node 13.7 x 23.3 mm. This is consistent ?with the ultrasound earlier today. Based on size and location, this ?is suspicious for malignancy and may represent a metastatic lymph ?node. Recommend tissue sampling. ?No pharyngeal mucosal mass. Recommend direct pharyngeal mucosal ?evaluation. ? ?Biopsies revealed:  ?12/15/2021 ?FINAL MICROSCOPIC DIAGNOSIS:  ?A. LYMPH NODE, LEFT CERVICAL, BIOPSY:  ?- Metastatic moderately differentiated squamous cell carcinoma, see  ?comment  ?COMMENT:  ?Immunostain for p16 is diffusely positive in the tumor cells.  In situ  ?hybridization for EBV (EBER) is negative. ? ?Nutrition Status Yes No Comments  ?Weight changes? '[]'$  '[x]'$    ?Swallowing concerns? '[]'$  '[x]'$    ?PEG? '[]'$  '[x]'$    ? ?Referrals Yes No Comments  ?Social Work? '[x]'$  '[]'$    ?Dentistry? '[]'$  '[x]'$    ?Swallowing therapy? '[x]'$  '[]'$    ?Nutrition? '[x]'$  '[]'$    ?Med/Onc? '[x]'$  '[]'$    ? ?Safety Issues Yes No Comments  ?Prior radiation? '[]'$  '[x]'$    ?Pacemaker/ICD? '[]'$  '[x]'$    ?Possible current pregnancy? '[]'$  '[x]'$  Hysterectomy  ?Is the patient on methotrexate? '[]'$  '[x]'$    ? ?Tobacco/Marijuana/Snuff/ETOH use: Patient has never smoked (other than occasionally during college) or used smokeless tobacco. Drinks alcohol occasionally. Denies any recreational drug use ? ?Past/Anticipated interventions by otolaryngology, if any:  ?12/23/2021 ?Dr.  [name] ?--Impression & Plans:  ?Metastatic p16 positive squamous cell carcinoma.  ?Suspicious about the oropharynx.  ?PET scan is scheduled for next Wednesday. ?Following that we will discuss the next step which will likely involve endoscopy and biopsies and possibly a neck dissection.  ? ?Past/Anticipated interventions by medical oncology, if any:  ?Saw Dr. Arletha Pili Iruku ?12/31/2021 ?We have discussed that she will have to proceed with biopsies by Dr. Constance Holster for evaluation of possible primary tumor.   ?If she is indeed stage I p16 positive squamous cell carcinoma, she would likely proceed with resection of primary and ipsilateral or bilateral neck dissection versus definitive radiotherapy versus rarely will need concurrent systemic therapy/RT.   ?I would recommend upfront surgery since she has excellent baseline performance status versus definitive RT alone.  ?If she does have any adverse pathologic features on surgery such as positive margin or extranodal extension, we will certainly recommend concurrent chemotherapy with radiation at that time. ?She can return to medical oncology as needed at this time ? ?Current Complaints / other details:  Nothing else of note ? ? ? ? ?

## 2021-12-31 NOTE — Progress Notes (Signed)
Oncology Nurse Navigator Documentation  ? ?Met with patient during initial consult with Dr. Chryl Heck. She was accompanied by her husband.  ?Further introduced myself as her/their Navigator, explained my role as a member of the Care Team. ?Assisted with post-consult appt scheduling.   ?She will see Dr. Isidore Moos on 4/24. She is waiting for an appointment for further biopsies by Dr. Constance Holster.  ?They verbalized understanding of information provided. ?I encouraged them to call with questions/concerns moving forward. ? ?[name], RN, BSN, OCN ?Head & Neck Oncology Nurse Navigator ?[facility] at [place] ?[phone]  ?

## 2022-01-01 NOTE — Progress Notes (Signed)
?Radiation Oncology         [phone] ?________________________________ ? ?Initial Outpatient Consultation ? ?Name: Jennifer [name] MRN: [id number]  ?Date: 01/03/2022  DOB: 07-Jun-1961 ? ?NI:OEVOJJ, [place], [place]  Benay Pike, MD  ? ?REFERRING PHYSICIAN: Benay Pike, MD ? ?DIAGNOSIS:  ?  ICD-10-CM   ?1. 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  ?2. Secondary malignant neoplasm of cervical lymph node ([place])  C77.0   ?  ? 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 Cancer Staging  ?Secondary malignant neoplasm of cervical lymph node ([place]) ?Staging form: Pharynx - HPV-Mediated Oropharynx, AJCC 8th Edition ?- Clinical stage from 01/03/2022: Stage I (cT0, cN1, cM0, p16+) - Signed by Eppie Gibson, MD on 01/04/2022 ?Stage prefix: Initial diagnosis ? ? ?CHIEF COMPLAINT: Here to discuss management of neck cancer ? ?HISTORY OF PRESENT ILLNESS::Jennifer [name] is a 61 y.o. female who presented to her PCP, Inda Coke PA, on 12/09/21 with a swollen left neck lymph node x 2 months. Soft tissue ultrasound of the neck performed that same date revealed a 13 mm lymph node in the left lateral neck, possibly suggestive of metastatic disease. CT of the neck also performed on that date showed an enlarged level 2 lymph node measuring 13.7 x 23.3 mm, consistent with ultrasound findings, and again notably suspicious for malignancy given its size and location.  ? ?Accordingly, the patient underwent biopsy of the enlarged lymph node on 12/15/21 which revealed metastatic moderately differentiated squamous cell carcinoma; p16 positive; EBV/EBER negative.  ? ?CT of the CAP on 12/16/21 showed no evidence of metastatic disease in the chest, abdomen, or pelvis. CT did reveal a 12 x 10 mm left psoas muscle lesion with central low attenuation. T2 imaging features noted on prior imaging were suggestive of a benign nerve sheath tumor such as schwannoma in respects to this finding.  The lesion was also noted with some peripheral enhancement. However, given its stability over time, findings are most compatible with a benign process.  ? ?Subsequently, the patient was referred to Dr. Constance Holster on 12/23/21. Physical exam performed during this visit revealed the palpable 2-3 cm firm left level 2 lymph node, otherwise no other masses, cervical adenopathy or thyroid nodules were appreciated. Following evaluation, Dr. Constance Holster recommended further work up via PET imaging before considering surgical options.  ? ?PET on 12/29/21 demonstrated hypermetabolic left cervical adenopathy. No additional evidence suggestive of abnormal hypermetabolism in the neck, chest, abdomen, or pelvis were appreciated.  I personally reviewed her images with her today.  To my eye her left tonsil appears slightly full on imaging. ? ?The patient was then referred to Dr. Chryl Heck on 12/31/21 to discuss treatment options. During this visit, Dr. Chryl Heck discussed the need for additional biopsies under Dr. Constance Holster for staging purposes. If she is found to have stage I disease, she would likely proceed with resection of her primary and ipsilateral or bilateral neck dissection versus definitive radiotherapy versus concurrent systemic therapy/RT. Ultimately, Dr. Chryl Heck recommends upfront surgery since she has an excellent baseline performance status versus definitive RT alone.  (If she does have any adverse pathologic features on surgery such as positive margin or extranodal extension, Dr. Chryl Heck recommends concurrent chemotherapy with radiation). ? ?Swallowing issues, if any: none ? ?Weight Changes: none ? ?Pain status: none ? ?Other symptoms: denies any symptoms other than the palpable enlarged left neck lymph node ? ?Tobacco history, if any: non-smoker ? ?ETOH  abuse, if any: drinks on occasion ? ?Prior cancers, if any: none ? 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 ?PREVIOUS  RADIATION THERAPY: No ? ?PAST MEDICAL HISTORY:  has a past medical history of Bowel obstruction ([place]), Fibroids, GERD (gastroesophageal reflux disease), H/O small bowel obstruction, and Hyperlipidemia.   ? ?PAST SURGICAL HISTORY: ?Past Surgical History:  ?Procedure Laterality Date  ? ABDOMINAL HYSTERECTOMY    ? DIAGNOSTIC LAPAROSCOPY    ? with lysis of adhesions   ? ROTATOR CUFF REPAIR Right 2017  ? ? ?FAMILY HISTORY: family history includes Breast cancer in her maternal aunt and paternal grandmother; Cancer in her maternal grandmother, mother, and paternal grandmother; Diabetes in her father; Early death in her mother; Heart disease in her father; Hyperlipidemia in her father. ? ?SOCIAL HISTORY:  reports that she has never smoked. She has never used smokeless tobacco. She reports current alcohol use of about 2.0 standard drinks per week. She reports that she does not use drugs. ? ?ALLERGIES: Codeine ? ?MEDICATIONS:  ?Current Outpatient Medications  ?Medication Sig Dispense Refill  ? calcium elemental as carbonate (BARIATRIC TUMS ULTRA) 400 MG chewable tablet Chew 1,000 mg by mouth daily.    ? cholecalciferol (VITAMIN D3) 25 MCG (1000 UNIT) tablet Take 1,000 Units by mouth 3 (three) times a week.    ? estradiol (ESTRACE) 0.1 MG/GM vaginal cream Apply intravaginally daily x 2 weeks, then decrease to 1 to 3 times weekly (Patient taking differently: Place 1 Applicatorful vaginally 2 (two) times a week.) 42.5 g 0  ? famotidine (PEPCID) 20 MG tablet TAKE 1 TABLET BY MOUTH TWICE DAILY (Patient taking differently: Take 20 mg by mouth daily as needed for heartburn or indigestion.) 180 tablet 1  ? ferrous sulfate 325 (65 FE) MG tablet Take 325 mg by mouth 3 (three) times a week.     ? ibuprofen (ADVIL,MOTRIN) 200 MG tablet Take 400 mg by mouth every 6 (six) hours as needed for mild pain.    ? Misc Natural Products (PROGESTERONE EX) Apply 5 drops topically at bedtime. Bioidentical progesterone compounded solution    ?  PRESCRIPTION MEDICATION Apply 3 drops topically daily. Bioidentical estradiol and testosterone compounded solution    ? rOPINIRole (REQUIP) 0.5 MG tablet Take 1 tablet (0.5 mg total) by mouth at bedtime. 30 tablet 1  ? zolpidem (AMBIEN) 5 MG tablet TAKE 1 TABLET BY MOUTH AT BEDTIME AS NEEDED FOR SLEEP 10 tablet 0  ? ?No current facility-administered medications for this encounter.  ? ? ?REVIEW OF SYSTEMS:  Notable for that above. ?  ?PHYSICAL EXAM:  height is '5\' 4"'$  (1.626 m) and weight is 130 lb (59 kg). Her temporal temperature is 96.9 ?F (36.1 ?C) (abnormal). Her blood pressure is 115/75 and her pulse is 98. Her respiration is 18 and oxygen saturation is 97%.   ?General: Alert and oriented, in no acute distress ?HEENT: Head is normocephalic. Extraocular movements are intact. Oropharynx is notable for no visible lesions.  No trismus.  Tongue is midline.  See skin exam ?Neck: Neck is notable for subtle palpable mass in level 2 of left neck, approximately 2 cm ?Heart: Regular in rate and rhythm with no murmurs, rubs, or gallops. ?Chest: Clear to auscultation bilaterally, with no rhonchi, wheezes, or rales. ?Abdomen: Soft, nontender, nondistended, with no rigidity or guarding. ?Extremities: No cyanosis or edema. ?Lymphatics: see Neck Exam ?Skin: She has a subtle raised slightly scaly lesion at  the edge of her left lower lip.  No other suspicious lesions appreciated in the head and neck region ?Musculoskeletal: symmetric strength and muscle tone throughout. ?Neurologic: Cranial nerves II through XII are grossly intact. No obvious focalities. Speech is fluent. Coordination is intact. ?Psychiatric: Judgment and insight are intact. Affect is appropriate. ? ? ?ECOG = 0 ? ?0 - Asymptomatic (Fully active, able to carry on all predisease activities without restriction) ? ?1 - Symptomatic but completely ambulatory (Restricted in physically strenuous activity but ambulatory and able to carry out work of a light or sedentary  nature. For example, light housework, office work) ? ?2 - Symptomatic, <50% in bed during the day (Ambulatory and capable of all self care but unable to carry out any work activities. Up and about more than 50% of

## 2022-01-03 ENCOUNTER — Ambulatory Visit
Admission: RE | Admit: 2022-01-03 | Discharge: 2022-01-03 | Disposition: A | Discharge status: Home / Self Care | Payer: 59 | Source: Ambulatory Visit | Attending: Radiation Oncology | Admitting: Radiation Oncology

## 2022-01-03 ENCOUNTER — Other Ambulatory Visit: Payer: Self-pay

## 2022-01-03 ENCOUNTER — Encounter: Payer: Self-pay | Admitting: Radiation Oncology

## 2022-01-03 VITALS — BP 115/75 | HR 98 | Temp 96.9°F | Resp 18 | Ht 64.0 in | Wt 130.0 lb

## 2022-01-03 DIAGNOSIS — K219 Gastro-esophageal reflux disease without esophagitis: Secondary | ICD-10-CM | POA: Insufficient documentation

## 2022-01-03 DIAGNOSIS — C770 Secondary and unspecified malignant neoplasm of lymph nodes of head, face and neck: Secondary | ICD-10-CM | POA: Diagnosis not present

## 2022-01-03 DIAGNOSIS — I517 Cardiomegaly: Secondary | ICD-10-CM | POA: Insufficient documentation

## 2022-01-03 DIAGNOSIS — E785 Hyperlipidemia, unspecified: Secondary | ICD-10-CM | POA: Insufficient documentation

## 2022-01-03 DIAGNOSIS — Z803 Family history of malignant neoplasm of breast: Secondary | ICD-10-CM | POA: Diagnosis not present

## 2022-01-03 DIAGNOSIS — Z79899 Other long term (current) drug therapy: Secondary | ICD-10-CM | POA: Insufficient documentation

## 2022-01-03 DIAGNOSIS — C760 Malignant neoplasm of head, face and neck: Secondary | ICD-10-CM | POA: Diagnosis not present

## 2022-01-03 DIAGNOSIS — I700 Atherosclerosis of aorta: Secondary | ICD-10-CM | POA: Insufficient documentation

## 2022-01-03 DIAGNOSIS — Z8719 Personal history of other diseases of the digestive system: Secondary | ICD-10-CM | POA: Diagnosis not present

## 2022-01-03 DIAGNOSIS — C779 Secondary and unspecified malignant neoplasm of lymph node, unspecified: Secondary | ICD-10-CM

## 2022-01-03 NOTE — Progress Notes (Signed)
Oncology Nurse Navigator Documentation  ? ?Met with patient during initial consult with Dr. Isidore Moos. She was accompanied by her husband. ? ?Provided New Patient Information packet: ?Contact information for physician, this navigator, other members of the Care Team ?Advance Directive information (Jennifer [name] with LCSW insert); provided [facility] AD booklet at his request,  ?Fall Prevention Patient Safety Plan ?Financial Assistance Information sheet ?Symptom Management Clinic information ?[place] with highlight of [place] ?SLP Information sheet ?Assisted with post-consult appt scheduling. Referral sent to Dermatology, Dentistry and phone call made to Dr. Janeice Robinson office to check on her biopsy scheduling.  ?I toured her to [place] and the [facility] treatment area, explained procedures for lobby registration, arrival to Radiation Waiting, and arrival to treatment area.  She voiced understanding.    ?They verbalized understanding of information provided. ?I encouraged them to call with questions/concerns moving forward. ? ?[name], RN, BSN, OCN ?Head & Neck Oncology Nurse Navigator ?[place] at [place] ?[phone]  ?

## 2022-01-03 NOTE — Progress Notes (Signed)
Oncology Nurse Navigator Documentation  ? ?Per patient's 01/03/22 consult with Dr. Isidore Moos, sent fax to Dr. Sheran Fava office Scheduling with request Ms. Kataoka be contacted and scheduled for a pre-radiation evaluation. Notification of successful fax transmission received.  ? ?[name] RN, BSN, OCN ?Head & Neck Oncology Nurse Navigator ?[place] at [facility] ?Phone # [phone]  ?Fax # [phone]  ?

## 2022-01-04 ENCOUNTER — Encounter: Payer: Self-pay | Admitting: Radiation Oncology

## 2022-01-04 DIAGNOSIS — C770 Secondary and unspecified malignant neoplasm of lymph nodes of head, face and neck: Secondary | ICD-10-CM | POA: Insufficient documentation

## 2022-01-05 ENCOUNTER — Telehealth: Payer: Self-pay | Admitting: Physician Assistant

## 2022-01-05 ENCOUNTER — Telehealth: Payer: 59 | Admitting: Physician Assistant

## 2022-01-05 ENCOUNTER — Other Ambulatory Visit (HOSPITAL_COMMUNITY): Payer: Self-pay

## 2022-01-05 ENCOUNTER — Encounter (HOSPITAL_COMMUNITY): Payer: Self-pay | Admitting: Otolaryngology

## 2022-01-05 DIAGNOSIS — R051 Acute cough: Secondary | ICD-10-CM

## 2022-01-05 MED ORDER — AMOXICILLIN-POT CLAVULANATE 875-125 MG PO TABS
1.0000 | ORAL_TABLET | Freq: Two times a day (BID) | ORAL | 0 refills | Status: DC
  Filled 2022-01-05: qty 10, 5d supply, fill #0

## 2022-01-05 NOTE — Progress Notes (Signed)
TWO VISITORS ARE ALLOWED TO COME WITH YOU AND STAY IN THE SURGICAL WAITING ROOM ONLY DURING PRE OP AND PROCEDURE DAY OF SURGERY.  ? ?PCP - Inda Coke, PA ?Cardiologist - n/a ?Oncology - Dr Arletha Pili Iruku ? ?CT Chest x-ray - 12/16/21 ?EKG - 01/15/21 ?Stress Test - n/a ?ECHO - n/a ?Cardiac Cath - n/a ? ?ICD Pacemaker/Loop - n/a ? ?Sleep Study -  n/a ?CPAP - none ? ?STOP now taking any Aspirin (unless otherwise instructed by your surgeon), Aleve, Naproxen, Ibuprofen, Motrin, Advil, Goody's, BC's, all herbal medications, fish oil, and all vitamins.  ? ?Coronavirus Screening ?Do you have any of the following symptoms:  ?Cough yes/no: No ?Fever (>100.47F)  yes/no: No ?Runny nose yes/no: No ?Sore throat yes/no: No ?Difficulty breathing/shortness of breath  yes/no: No ? ?Have you traveled in the last 14 days and where? yes/no: No ? ?Patient verbalized understanding of instructions that were given via phone. ?

## 2022-01-05 NOTE — Progress Notes (Signed)
?Virtual Visit Consent  ? ?Jennifer [name], you are scheduled for a virtual visit with a [facility] provider today.   ?  ?Just as with appointments in the office, your consent must be obtained to participate.  Your consent will be active for this visit and any virtual visit you may have with one of our providers in the next 365 days.   ?  ?If you have a MyChart account, a copy of this consent can be sent to you electronically.  All virtual visits are billed to your insurance company just like a traditional visit in the office.   ? ?As this is a virtual visit, video technology does not allow for your provider to perform a traditional examination.  This may limit your provider's ability to fully assess your condition.  If your provider identifies any concerns that need to be evaluated in person or the need to arrange testing (such as labs, EKG, etc.), we will make arrangements to do so.   ?  ?Although advances in technology are sophisticated, we cannot ensure that it will always work on either your end or our end.  If the connection with a video visit is poor, the visit may have to be switched to a telephone visit.  With either a video or telephone visit, we are not always able to ensure that we have a secure connection.    ? ?Also, by engaging in this virtual visit, you consent to the provision of healthcare. Additionally, you authorize for your insurance to be billed (if applicable) for the services provided during this visit.  ? ?I need to obtain your verbal consent now.   Are you willing to proceed with your visit today?  ?  ?Jennifer [name] has provided verbal consent on 01/05/2022 for a virtual visit (video or telephone). ?  ?Jennifer Coke, PA  ? ?Date: 01/05/2022 6:05 PM ? ? ?Virtual Visit via Video Note  ? ?IInda [name], connected with  Jennifer [name]  ([id number], July 11, 1961) on 01/05/22 at  5:30 PM EDT by a video-enabled telemedicine application and verified that I am speaking with the correct person using  two identifiers. ? ?Location: ?Patient: Virtual Visit Location Patient: Home ?Provider: Virtual Visit Location Provider: Home Office ?  ?I discussed the limitations of evaluation and management by telemedicine and the availability of in person appointments. The patient expressed understanding and agreed to proceed.   ? ?History of Present Illness: ?Jennifer [name] is a 61 y.o. who identifies as a female who was assigned female at birth, and is being seen today for cough. ? ?Cough ?Has had sore throat x 2 weeks, overall waxing and waning and currently improved. Coughing up thick globs of yellow mucus as of this morning She was taking ibuprofen and cold medicine. Felt fatigued and had more sore throat this weekend. She is worried about her upcoming procedure that she is doing on Friday with ENT to evaluate her throat/neck for CA.  ? ?Denies: fevers, chills, n/v/d, COVID+ test ? ?HPI: HPI  ?Problems:  ?Patient Active Problem List  ? Diagnosis Date Noted  ? 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 Nonallopathic lesion of sacral region 07/02/2020  ? Pruritus ani 04/21/2020  ? Microcytic anemia 04/21/2020  ? 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 Pain of left hand 02/07/2019  ? Small bowel obstruction (Jennifer [name]) 05/21/2018  ?  Popliteus tendinitis of right lower extremity 08/01/2017  ? SBO (small bowel obstruction) (Jennifer [name]) 05/14/2017  ? GERD (gastroesophageal reflux disease) 03/04/2017  ?  ?Allergies:  ?Allergies  ?Allergen Reactions  ? Codeine Nausea And Vomiting  ? ?Medications:  ?Current Outpatient Medications:  ?  amoxicillin-clavulanate (AUGMENTIN) 875-125 MG tablet, Take 1 tablet by mouth 2 (two) times daily., Disp: 10 tablet, Rfl: 0 ?  calcium elemental as carbonate (BARIATRIC TUMS ULTRA) 400 MG chewable tablet, Chew 1,000 mg by mouth daily., Disp: , Rfl:  ?  cholecalciferol (VITAMIN D3)  25 MCG (1000 UNIT) tablet, Take 1,000 Units by mouth 3 (three) times a week., Disp: , Rfl:  ?  estradiol (ESTRACE) 0.1 MG/GM vaginal cream, Apply intravaginally daily x 2 weeks, then decrease to 1 to 3 times weekly (Patient taking differently: Place 1 Applicatorful vaginally 2 (two) times a week.), Disp: 42.5 g, Rfl: 0 ?  famotidine (PEPCID) 20 MG tablet, TAKE 1 TABLET BY MOUTH TWICE DAILY (Patient taking differently: Take 20 mg by mouth at bedtime as needed for heartburn or indigestion.), Disp: 180 tablet, Rfl: 1 ?  ferrous sulfate 325 (65 FE) MG tablet, Take 325 mg by mouth 3 (three) times a week. , Disp: , Rfl:  ?  ibuprofen (ADVIL,MOTRIN) 200 MG tablet, Take 400 mg by mouth every 6 (six) hours as needed for mild pain., Disp: , Rfl:  ?  Misc Natural Products (PROGESTERONE EX), Apply 5 drops topically at bedtime. Bioidentical progesterone compounded solution, Disp: , Rfl:  ?  PRESCRIPTION MEDICATION, Apply 3 drops topically daily. Bioidentical estradiol and testosterone compounded solution, Disp: , Rfl:  ?  rOPINIRole (REQUIP) 0.5 MG tablet, Take 1 tablet (0.5 mg total) by mouth at bedtime., Disp: 30 tablet, Rfl: 1 ?  zolpidem (AMBIEN) 5 MG tablet, TAKE 1 TABLET BY MOUTH AT BEDTIME AS NEEDED FOR SLEEP, Disp: 10 tablet, Rfl: 0 ? ?Observations/Objective: ?Patient is well-developed, well-nourished in no acute distress.  ?Resting comfortably  at home.  ?Head is normocephalic, atraumatic.  ?No labored breathing.  ?Speech is clear and coherent with logical content.  ?Patient is alert and oriented at baseline.  ? ? ?Assessment and Plan: ?1. Acute cough ?New ?Start oral augmentin to cover for possible URI ?She is going to reach out to her ENT doing the procedure on Friday to let him know about sx and abx ?Continue to monitor and symptoms ? ? ?Follow Up Instructions: ?I discussed the assessment and treatment plan with the patient. The patient was provided an opportunity to ask questions and all were answered. The patient  agreed with the plan and demonstrated an understanding of the instructions.  A copy of instructions were sent to the patient via MyChart unless otherwise noted below.  ? ?The patient was advised to call back or seek an in-person evaluation if the symptoms worsen or if the condition fails to improve as anticipated. ? ?Time:  ?I spent 15 minutes with the patient via telehealth technology discussing the above problems/concerns.   ? ?Jennifer Coke, PA ? ?

## 2022-01-05 NOTE — H&P (Signed)
HPI:  ? ?Jennifer [name] is a 61 y.o. female who presents as a consult Patient.  ? ?Referring Provider: Asencion Partridge, * ? ?Chief complaint: Neck mass. ? ?HPI: She noticed a left-sided neck mass a few months ago. She had no other symptoms related to this. She underwent an ultrasound followed by a CT scan. Based on the CT recommendation was made for core needle biopsy. She underwent that and that was found to be positive for squamous cell carcinoma, p16 positive. She has had appointments with the cancer center and an appointment was made for PET scan which is scheduled for next week. She does not smoke. She drinks only on occasion. ? ?PMH/Meds/All/SocHx/FamHx/ROS:  ? ?Past Medical History:  ?Diagnosis Date  ? Endometriosis  ? GERD (gastroesophageal reflux disease)  ? 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 Small bowel obstruction due to adhesions [facility])  ? ?Past Surgical History:  ?Procedure Laterality Date  ? DIAGNOSTIC LAPAROSCOPY  ? DIAGNOSTIC LAPAROSCOPY N/A 08/29/2019  ?Procedure: DIAGNOSTIC LAPAROSCOPY, LYSIS OF ADHESIONS, partial omentectomy; Surgeon: Dannielle Burn, MD; Location: [facility]; Service: General; Laterality: N/A;  ? EXPLORATORY LAPAROTOMY  ? EXPLORATORY LAPAROTOMY  ? EXPLORATORY LAPAROTOMY  ? HYSTERECTOMY  ? ?No family history of bleeding disorders, wound healing problems or difficulty with anesthesia.  ? ?Social History  ? ?Socioeconomic History  ? Marital status: Married  ?Spouse name: Not on file  ? Number of children: Not on file  ? Years of education: Not on file  ? Highest education level: Not on file  ?Occupational History  ? Not on file  ?Tobacco Use  ? Smoking status: Former  ?Types: Cigarettes  ?Quit date: 09/12/1993  ?Years since quitting: 28.2  ? Smokeless tobacco: Never  ?Substance and Sexual Activity  ? Alcohol use: Yes  ? Drug use: Never  ? Sexual activity: Never  ?Other Topics Concern  ? Not on file  ?Social History Narrative  ? Not on file   ? ?Social Determinants of Health  ? ?Financial Resource Strain: Not on file  ?Food Insecurity: Not on file  ?Transportation Needs: Not on file  ?Physical Activity: Not on file  ?Stress: Not on file  ?Social Connections: Not on file  ?Housing Stability: Not on file  ? ?Current Outpatient Medications:  ? betamethasone acetate-betamethasone sodium phosphate (CELESTONE) 6 mg/mL injection, 1 mL (6 mg total)., Disp: , Rfl:  ? BUPivacaine, PF, (MARCAINE) 0.25 % (2.5 mg/mL) injection, 1 mL (2.5 mg total)., Disp: , Rfl:  ? calcium carbonate 195 mg calcium (500 mg) Chew, , Disp: , Rfl:  ? cholecalciferol, vitamin D3, 25 mcg (1,000 unit) capsule, Take 1 capsule (1,000 Units total) by mouth daily., Disp: , Rfl:  ? famotidine (PEPCID) 20 MG tablet, Take 2 tablets (40 mg total) by mouth daily., Disp: , Rfl:  ? ferrous sulfate (FERATAB) 325 (65 FE) MG tablet, Take 1 tablet (325 mg total) by mouth once a week., Disp: , Rfl:  ? ibuprofen (ADVIL,MOTRIN) 200 MG tablet, Take 2 tablets (400 mg total) by mouth every 6 (six) hours as needed for Pain., Disp: , Rfl:  ? rOPINIRole (REQUIP) 0.5 MG tablet, Take 1 tablet (0.5 mg total) by mouth., Disp: , Rfl:  ? zolpidem (AMBIEN) 5 MG tablet, zolpidem 5 mg tablet, Disp: , Rfl:  ? ?A complete ROS was performed with pertinent positives/negatives noted in the HPI. The remainder of the ROS are negative. ? ? ?Physical Exam:  ? ?There were no vitals taken  for this visit. ? ?General: Healthy and alert, in no distress, breathing easily. Normal affect. In a pleasant mood. ?Head: Normocephalic, atraumatic. No masses, or scars. ?Eyes: Pupils are equal, and reactive to light. Vision is grossly intact. No spontaneous or gaze nystagmus. ?Ears: Ear canals are clear. Tympanic membranes are intact, with normal landmarks and the middle ears are clear and healthy. ?Hearing: Grossly normal. ?Nose: Nasal cavities are clear with healthy mucosa, no polyps or exudate. Airways are patent. ?Face: No masses or scars,  facial nerve function is symmetric. ?Oral Cavity: No mucosal abnormalities are noted. Tongue with normal mobility. Dentition appears very healthy. ?Oropharynx: Tonsils are slightly asymmetric. The left side is a little bit larger. There is no obvious ulceration of the surface. Palpation of the left tonsil is slightly firm. There are no mucosal masses identified. Tongue base appears normal and healthy. No palpable masses in the base of tongue. ?Larynx/Hypopharynx: indirect exam reveals healthy, mobile vocal cords, without mucosal lesions in the hypopharynx or larynx. ?Chest: Deferred ?Neck: There is a 2 or 3 cm firm left level 2 lymph node, otherwise no masses or cervical adenopathy, no thyroid nodules or enlargement. ?Neuro: Cranial nerves II-XII with normal function. ?Balance: Normal gate. ?Other findings: none. ? ?Independent Review of Additional Tests or Records:  ?CT reviewed. ? ?Procedures:  ?none ? ?Impression & Plans:  ?Metastatic p16 positive squamous cell carcinoma. Suspicious about the oropharynx. PET scan is scheduled for next Wednesday. Following that we will discuss the next step which will likely involve endoscopy and biopsies and possibly a neck dissection. ?

## 2022-01-05 NOTE — Telephone Encounter (Signed)
Called pt back told her Jennifer [name] said she can see her tonight at a Virtual Urgent care visit thru My Chart. Explained to pt how to schedule appointment. Pt verbalized understanding and is scheduled for tonight at 5:30 PM. ?

## 2022-01-05 NOTE — Telephone Encounter (Signed)
Spoke to pt asked her how can I help you? Pt said she is scheduled for a procedure on Friday and was told by pre-op nurse need to get rid of cold. Pt said she has been sick x 1 week, started with sore throat, now cough and expectorating thick yellow sputum, Chest congestion. Denies fever or chills. Pt said she does not want to cancel procedure for Friday 4/28. She is taking Multi symptom cold medication. Told pt let me discuss with [facility] and I will call you back. Pt verbalized understanding. ?

## 2022-01-05 NOTE — Telephone Encounter (Signed)
Pt states she has had a recent cancer diagnosis and is having a procedure done on Friday. She has a few questions and is asking to speak with someone.  ?

## 2022-01-06 ENCOUNTER — Ambulatory Visit
Admission: RE | Admit: 2022-01-06 | Discharge: 2022-01-06 | Disposition: A | Discharge status: Home / Self Care | Payer: 59 | Source: Ambulatory Visit

## 2022-01-06 DIAGNOSIS — Z1231 Encounter for screening mammogram for malignant neoplasm of breast: Secondary | ICD-10-CM | POA: Diagnosis not present

## 2022-01-07 ENCOUNTER — Other Ambulatory Visit: Payer: Self-pay

## 2022-01-07 ENCOUNTER — Ambulatory Visit (HOSPITAL_COMMUNITY): Payer: 59 | Admitting: Anesthesiology

## 2022-01-07 ENCOUNTER — Encounter (HOSPITAL_COMMUNITY)
Admission: RE | Disposition: A | Discharge status: Home / Self Care | Payer: Self-pay | Source: Home / Self Care | Attending: Otolaryngology

## 2022-01-07 ENCOUNTER — Other Ambulatory Visit: Payer: Self-pay | Admitting: Otolaryngology

## 2022-01-07 ENCOUNTER — Encounter (HOSPITAL_COMMUNITY): Payer: Self-pay | Admitting: Otolaryngology

## 2022-01-07 ENCOUNTER — Ambulatory Visit (HOSPITAL_BASED_OUTPATIENT_CLINIC_OR_DEPARTMENT_OTHER): Payer: 59 | Admitting: Anesthesiology

## 2022-01-07 ENCOUNTER — Ambulatory Visit (HOSPITAL_COMMUNITY)
Admission: RE | Admit: 2022-01-07 | Discharge: 2022-01-07 | Disposition: A | Discharge status: Home / Self Care | Payer: 59 | Attending: Otolaryngology | Admitting: Otolaryngology

## 2022-01-07 DIAGNOSIS — C760 Malignant neoplasm of head, face and neck: Secondary | ICD-10-CM | POA: Insufficient documentation

## 2022-01-07 DIAGNOSIS — E785 Hyperlipidemia, unspecified: Secondary | ICD-10-CM

## 2022-01-07 DIAGNOSIS — K219 Gastro-esophageal reflux disease without esophagitis: Secondary | ICD-10-CM | POA: Diagnosis not present

## 2022-01-07 DIAGNOSIS — C799 Secondary malignant neoplasm of unspecified site: Secondary | ICD-10-CM

## 2022-01-07 DIAGNOSIS — C4442 Squamous cell carcinoma of skin of scalp and neck: Secondary | ICD-10-CM

## 2022-01-07 DIAGNOSIS — Z87891 Personal history of nicotine dependence: Secondary | ICD-10-CM | POA: Insufficient documentation

## 2022-01-07 DIAGNOSIS — D0008 Carcinoma in situ of pharynx: Secondary | ICD-10-CM | POA: Diagnosis not present

## 2022-01-07 DIAGNOSIS — C099 Malignant neoplasm of tonsil, unspecified: Secondary | ICD-10-CM | POA: Diagnosis not present

## 2022-01-07 DIAGNOSIS — C801 Malignant (primary) neoplasm, unspecified: Secondary | ICD-10-CM | POA: Diagnosis not present

## 2022-01-07 DIAGNOSIS — Z20822 Contact with and (suspected) exposure to covid-19: Secondary | ICD-10-CM | POA: Insufficient documentation

## 2022-01-07 DIAGNOSIS — C7989 Secondary malignant neoplasm of other specified sites: Secondary | ICD-10-CM | POA: Diagnosis not present

## 2022-01-07 HISTORY — DX: Malignant (primary) neoplasm, unspecified: C80.1

## 2022-01-07 HISTORY — DX: Restless legs syndrome: G25.81

## 2022-01-07 HISTORY — DX: Anemia, unspecified: D64.9

## 2022-01-07 HISTORY — PX: LARYNGOSCOPY AND ESOPHAGOSCOPY: SHX5660

## 2022-01-07 LAB — SARS CORONAVIRUS 2 BY RT PCR: SARS Coronavirus 2 by RT PCR: NEGATIVE

## 2022-01-07 SURGERY — LARYNGOSCOPY, WITH ESOPHAGOSCOPY
Anesthesia: General

## 2022-01-07 MED ORDER — LIDOCAINE 2% (20 MG/ML) 5 ML SYRINGE
INTRAMUSCULAR | Status: DC | PRN
  Administered 2022-01-07: 50 mg via INTRAVENOUS

## 2022-01-07 MED ORDER — PHENYLEPHRINE 80 MCG/ML (10ML) SYRINGE FOR IV PUSH (FOR BLOOD PRESSURE SUPPORT)
PREFILLED_SYRINGE | INTRAVENOUS | Status: AC
  Filled 2022-01-07: qty 20

## 2022-01-07 MED ORDER — ORAL CARE MOUTH RINSE: 15.0000 mL | Freq: Once | OROMUCOSAL | Status: AC

## 2022-01-07 MED ORDER — DEXAMETHASONE SODIUM PHOSPHATE 10 MG/ML IJ SOLN
INTRAMUSCULAR | Status: DC | PRN
  Administered 2022-01-07: 10 mg via INTRAVENOUS

## 2022-01-07 MED ORDER — SCOPOLAMINE 1 MG/3DAYS TD PT72
1.0000 | MEDICATED_PATCH | TRANSDERMAL | Status: DC
  Administered 2022-01-07: 1.5 mg via TRANSDERMAL
  Filled 2022-01-07: qty 1

## 2022-01-07 MED ORDER — ONDANSETRON HCL 4 MG/2ML IJ SOLN
INTRAMUSCULAR | Status: AC
  Filled 2022-01-07: qty 2

## 2022-01-07 MED ORDER — OXYCODONE HCL 5 MG PO TABS: 5.0000 mg | ORAL_TABLET | Freq: Once | ORAL | Status: DC | PRN

## 2022-01-07 MED ORDER — FENTANYL CITRATE (PF) 100 MCG/2ML IJ SOLN
25.0000 ug | INTRAMUSCULAR | Status: DC | PRN
  Administered 2022-01-07: 50 ug via INTRAVENOUS

## 2022-01-07 MED ORDER — LIDOCAINE 2% (20 MG/ML) 5 ML SYRINGE
INTRAMUSCULAR | Status: AC
  Filled 2022-01-07: qty 5

## 2022-01-07 MED ORDER — CHLORHEXIDINE GLUCONATE 0.12 % MT SOLN
OROMUCOSAL | Status: AC
  Administered 2022-01-07: 15 mL via OROMUCOSAL
  Filled 2022-01-07: qty 15

## 2022-01-07 MED ORDER — LACTATED RINGERS IV SOLN: INTRAVENOUS | Status: DC

## 2022-01-07 MED ORDER — 0.9 % SODIUM CHLORIDE (POUR BTL) OPTIME
TOPICAL | Status: DC | PRN
Start: 2022-01-07 — End: 2022-01-07
  Administered 2022-01-07: 1000 mL

## 2022-01-07 MED ORDER — FENTANYL CITRATE (PF) 250 MCG/5ML IJ SOLN
INTRAMUSCULAR | Status: DC | PRN
  Administered 2022-01-07: 100 ug via INTRAVENOUS

## 2022-01-07 MED ORDER — PHENYLEPHRINE 80 MCG/ML (10ML) SYRINGE FOR IV PUSH (FOR BLOOD PRESSURE SUPPORT)
PREFILLED_SYRINGE | INTRAVENOUS | Status: DC | PRN
  Administered 2022-01-07: 160 ug via INTRAVENOUS
  Administered 2022-01-07: 80 ug via INTRAVENOUS
  Administered 2022-01-07: 160 ug via INTRAVENOUS

## 2022-01-07 MED ORDER — SUGAMMADEX SODIUM 200 MG/2ML IV SOLN
INTRAVENOUS | Status: DC | PRN
  Administered 2022-01-07: 200 mg via INTRAVENOUS

## 2022-01-07 MED ORDER — ROCURONIUM BROMIDE 10 MG/ML (PF) SYRINGE
PREFILLED_SYRINGE | INTRAVENOUS | Status: AC
  Filled 2022-01-07: qty 10

## 2022-01-07 MED ORDER — OXYMETAZOLINE HCL 0.05 % NA SOLN
NASAL | Status: AC
  Filled 2022-01-07: qty 30

## 2022-01-07 MED ORDER — ONDANSETRON 4 MG PO TBDP: 4.0000 mg | ORAL_TABLET | Freq: Three times a day (TID) | ORAL | 0 refills | Status: DC | PRN

## 2022-01-07 MED ORDER — EPHEDRINE 5 MG/ML INJ
INTRAVENOUS | Status: AC
  Filled 2022-01-07: qty 15

## 2022-01-07 MED ORDER — MIDAZOLAM HCL 2 MG/2ML IJ SOLN
INTRAMUSCULAR | Status: AC
  Filled 2022-01-07: qty 2

## 2022-01-07 MED ORDER — DEXAMETHASONE SODIUM PHOSPHATE 10 MG/ML IJ SOLN
INTRAMUSCULAR | Status: AC
  Filled 2022-01-07: qty 1

## 2022-01-07 MED ORDER — MIDAZOLAM HCL 2 MG/2ML IJ SOLN
INTRAMUSCULAR | Status: DC | PRN
  Administered 2022-01-07: 2 mg via INTRAVENOUS

## 2022-01-07 MED ORDER — HYDROCODONE-ACETAMINOPHEN 7.5-325 MG PO TABS: 1.0000 | ORAL_TABLET | Freq: Four times a day (QID) | ORAL | 0 refills | Status: DC | PRN

## 2022-01-07 MED ORDER — FENTANYL CITRATE (PF) 100 MCG/2ML IJ SOLN
INTRAMUSCULAR | Status: AC
  Filled 2022-01-07: qty 2

## 2022-01-07 MED ORDER — CHLORHEXIDINE GLUCONATE 0.12 % MT SOLN: 15.0000 mL | Freq: Once | OROMUCOSAL | Status: AC

## 2022-01-07 MED ORDER — PROPOFOL 10 MG/ML IV BOLUS
INTRAVENOUS | Status: AC
  Filled 2022-01-07: qty 20

## 2022-01-07 MED ORDER — EPHEDRINE SULFATE-NACL 50-0.9 MG/10ML-% IV SOSY
PREFILLED_SYRINGE | INTRAVENOUS | Status: DC | PRN
  Administered 2022-01-07: 5 mg via INTRAVENOUS

## 2022-01-07 MED ORDER — PROPOFOL 10 MG/ML IV BOLUS
INTRAVENOUS | Status: DC | PRN
  Administered 2022-01-07: 120 mg via INTRAVENOUS
  Administered 2022-01-07: 50 mg via INTRAVENOUS

## 2022-01-07 MED ORDER — AMISULPRIDE (ANTIEMETIC) 5 MG/2ML IV SOLN: 10.0000 mg | Freq: Once | INTRAVENOUS | Status: DC | PRN

## 2022-01-07 MED ORDER — ONDANSETRON HCL 4 MG/2ML IJ SOLN
INTRAMUSCULAR | Status: DC | PRN
  Administered 2022-01-07: 4 mg via INTRAVENOUS

## 2022-01-07 MED ORDER — EPINEPHRINE PF 1 MG/ML IJ SOLN
INTRAMUSCULAR | Status: AC
  Filled 2022-01-07: qty 1

## 2022-01-07 MED ORDER — FENTANYL CITRATE (PF) 250 MCG/5ML IJ SOLN
INTRAMUSCULAR | Status: AC
  Filled 2022-01-07: qty 5

## 2022-01-07 MED ORDER — ACETAMINOPHEN 500 MG PO TABS
1000.0000 mg | ORAL_TABLET | Freq: Once | ORAL | Status: AC
  Administered 2022-01-07: 1000 mg via ORAL
  Filled 2022-01-07: qty 2

## 2022-01-07 MED ORDER — ROCURONIUM BROMIDE 10 MG/ML (PF) SYRINGE
PREFILLED_SYRINGE | INTRAVENOUS | Status: DC | PRN
  Administered 2022-01-07: 50 mg via INTRAVENOUS

## 2022-01-07 MED ORDER — OXYCODONE HCL 5 MG/5ML PO SOLN: 5.0000 mg | Freq: Once | ORAL | Status: DC | PRN

## 2022-01-07 SURGICAL SUPPLY — 33 items
BAG COUNTER SPONGE SURGICOUNT (BAG) ×3 IMPLANT
BLADE SURG 15 STRL LF DISP TIS (BLADE) IMPLANT
BLADE SURG 15 STRL SS (BLADE)
CANISTER SUCT 3000ML PPV (MISCELLANEOUS) ×3 IMPLANT
CATH ROBINSON RED A/P 10FR (CATHETERS) ×3 IMPLANT
CLEANER TIP ELECTROSURG 2X2 (MISCELLANEOUS) ×3 IMPLANT
COAGULATOR SUCT SWTCH 10FR 6 (ELECTROSURGICAL) ×3 IMPLANT
DRAPE HALF SHEET 40X57 (DRAPES) IMPLANT
ELECT COATED BLADE 2.86 ST (ELECTRODE) ×3 IMPLANT
ELECT REM PT RETURN 9FT ADLT (ELECTROSURGICAL) ×3
ELECT REM PT RETURN 9FT PED (ELECTROSURGICAL)
ELECTRODE REM PT RETRN 9FT PED (ELECTROSURGICAL) IMPLANT
ELECTRODE REM PT RTRN 9FT ADLT (ELECTROSURGICAL) ×1 IMPLANT
GAUZE 4X4 16PLY ~~LOC~~+RFID DBL (SPONGE) ×3 IMPLANT
GLOVE ECLIPSE 7.5 STRL STRAW (GLOVE) ×3 IMPLANT
GOWN STRL REUS W/ TWL LRG LVL3 (GOWN DISPOSABLE) ×3 IMPLANT
GOWN STRL REUS W/TWL LRG LVL3 (GOWN DISPOSABLE) ×1
KIT BASIN OR (CUSTOM PROCEDURE TRAY) ×3 IMPLANT
KIT TURNOVER KIT B (KITS) ×3 IMPLANT
NS IRRIG 1000ML POUR BTL (IV SOLUTION) ×3 IMPLANT
PACK SURGICAL SETUP 50X90 (CUSTOM PROCEDURE TRAY) ×3 IMPLANT
PAD ARMBOARD 7.5X6 YLW CONV (MISCELLANEOUS) ×6 IMPLANT
PENCIL FOOT CONTROL (ELECTRODE) ×3 IMPLANT
SPECIMEN JAR SMALL (MISCELLANEOUS) ×6 IMPLANT
SPONGE TONSIL TAPE 1 RFD (DISPOSABLE) ×3 IMPLANT
SYR BULB EAR ULCER 3OZ GRN STR (SYRINGE) ×3 IMPLANT
TOWEL GREEN STERILE FF (TOWEL DISPOSABLE) ×6 IMPLANT
TUBE CONNECTING 12X1/4 (SUCTIONS) ×3 IMPLANT
TUBE SALEM SUMP 10F W/ARV (TUBING) IMPLANT
TUBE SALEM SUMP 12R W/ARV (TUBING) IMPLANT
TUBE SALEM SUMP 14F W/ARV (TUBING) IMPLANT
TUBE SALEM SUMP 16 FR W/ARV (TUBING) IMPLANT
WATER STERILE IRR 1000ML POUR (IV SOLUTION) ×3 IMPLANT

## 2022-01-07 NOTE — Op Note (Signed)
OPERATIVE REPORT ? ?DATE OF SURGERY: 01/07/2022 ? ?PATIENT:  Jennifer [name],  61 y.o. female ? 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 ?PROCEDURE:  Procedure(s): ?DIRECT LARYNGOSCOPY WITH BIOPSY; ESOPHAGOSCOPY; FROZEN SECTIONS ? ?SURGEON:  Beckie Salts, MD ? ?ASSISTANTS: none ? ?ANESTHESIA:   General  ? ?EBL: 20 ml ? ?DRAINS: none ? ?LOCAL MEDICATIONS USED:  None ? ?SPECIMEN: Left tonsil biopsy, frozen section, consistent with squamous cell carcinoma ? ?COUNTS:  Correct ? ?PROCEDURE DETAILS: ?The patient was taken to the operating room and placed on the operating table in the supine position. Following induction of general endotracheal anesthesia, the table was turned 90? and the patient was draped in a standard fashion.  Upper and lower tooth protectors were used throughout the case. ? ?1.  Rigid cervical esophagoscopy.  The rigid cervical esophagoscope was passed into the oral cavity and into the esophagus.  It was slowly withdrawn while suctioning secretions.  There were no abnormalities identified in the mucosal wall of the esophagus. ? ?2.  Direct laryngoscopy with biopsy.  The Jako laryngoscope was then used to visualize the structures of the larynx, hypopharynx and oropharynx.  The cords, subglottic, supraglottic larynx, vallecula, piriforms were all clear.  There was some friability to the left tonsil.  The tongue base was not involved.  By palpation the tonsil on the left was firm and enlarged.  Biopsies were taken from this and sent for frozen section analysis.  These were positive.  Secretions were suctioned.  Patient was then awakened extubated and transferred to recovery in stable condition. ? ? ? ?PATIENT DISPOSITION:  To PACU, stable ? ? ?2020 ?

## 2022-01-07 NOTE — Progress Notes (Signed)
Pt reports she had a cold that started a week ago (productive cough, chest congestion and swollen neck). Pt reports she told PCP and was started on an antibiotic on Wednesday. Pt reports symptoms are improved and currently has non-productive cough with swollen neck. Denies Fever, chills, aches, nausea, vomiting or other signs/symptoms of Covid. Pt states that Dr. Constance Holster is aware of this. Anesthesia provider notified.  ? ?Covid test sent to lab per protocol d/t ENT procedure. Currently in process.  ?

## 2022-01-07 NOTE — Interval H&P Note (Signed)
History and Physical Interval Note: ? ?01/07/2022 ?11:51 AM ? 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  The various methods of treatment have been discussed with the patient and family. After consideration of risks, benefits and other options for treatment, the patient has consented to  Procedure(s): ?DIRECT LARYNGOSCOPY WITH BIOPSY; ESOPHAGOSCOPY; FROZEN SECTIONS (N/A) ?POSSIBLE TONSILLECTOMY (Bilateral) as a surgical intervention.  The patient's history has been reviewed, patient examined, no change in status, stable for surgery.  I have reviewed the patient's chart and labs.  Questions were answered to the patient's satisfaction.   ? ? ?[name] ? ? ?

## 2022-01-07 NOTE — Transfer of Care (Signed)
Immediate Anesthesia Transfer of Care Note ? ?Patient: Jennifer [name] ? ?Procedure(s) Performed: DIRECT LARYNGOSCOPY WITH BIOPSY; ESOPHAGOSCOPY; FROZEN SECTIONS ? ?Patient Location: PACU ? ?Anesthesia Type:General ? ?Level of Consciousness: awake and alert  ? ?Airway & Oxygen Therapy: Patient Spontanous Breathing ? ?Post-op Assessment: Report given to RN and Post -op Vital signs reviewed and stable ? ?Post vital signs: Reviewed and stable ? ?Last Vitals:  ?Vitals Value Taken Time  ?BP 127/55 01/07/22 1332  ?Temp    ?Pulse 82 01/07/22 1334  ?Resp 13 01/07/22 1334  ?SpO2 98 % 01/07/22 1334  ?Vitals shown include unvalidated device data. ? ?Last Pain:  ?Vitals:  ? 01/07/22 1101  ?TempSrc:   ?PainSc: 0-No pain  ?   ? ?  ? ?Complications: No notable events documented. ?

## 2022-01-07 NOTE — Anesthesia Postprocedure Evaluation (Signed)
Anesthesia Post Note ? ?Patient: CHEYENNA PANKOWSKI ? ?Procedure(s) Performed: DIRECT LARYNGOSCOPY WITH BIOPSY; ESOPHAGOSCOPY; FROZEN SECTIONS ? ?  ? ?Patient location during evaluation: PACU ?Anesthesia Type: General ?Level of consciousness: awake and alert ?Pain management: pain level controlled ?Vital Signs Assessment: post-procedure vital signs reviewed and stable ?Respiratory status: spontaneous breathing, nonlabored ventilation, respiratory function stable and patient connected to nasal cannula oxygen ?Cardiovascular status: blood pressure returned to baseline and stable ?Postop Assessment: no apparent nausea or vomiting ?Anesthetic complications: no ? ? ?No notable events documented. ? ?Last Vitals:  ?Vitals:  ? 01/07/22 1350 01/07/22 1405  ?BP: 124/62 116/74  ?Pulse: 80 69  ?Resp: 14 10  ?Temp:  36.7 ?C  ?SpO2: 96% 98%  ?  ?Last Pain:  ?Vitals:  ? 01/07/22 1405  ?TempSrc:   ?PainSc: 1   ? ? ?  ?  ?  ?  ?  ?  ? ?March Rummage Cicely Ortner ? ? ? ? ?

## 2022-01-07 NOTE — Anesthesia Preprocedure Evaluation (Addendum)
Anesthesia Evaluation  ?Patient identified by MRN, date of birth, ID band ?Patient awake ? ? ? ?Reviewed: ?Allergy & Precautions, NPO status , Patient's Chart, lab work & pertinent test results ? ?Airway ?Mallampati: I ? ?TM Distance: >3 FB ?Neck ROM: Full ? ? ? Dental ?no notable dental hx. ? ?  ?Pulmonary ?Recent URI , Residual Cough,  ?  ?Pulmonary exam normal ?breath sounds clear to auscultation ? ? ? ? ? ? Cardiovascular ?negative cardio ROS ?Normal cardiovascular exam ?Rhythm:Regular Rate:Normal ? ? ?  ?Neuro/Psych ?negative neurological ROS ? negative psych ROS  ? GI/Hepatic ?Neg liver ROS, GERD  Medicated,  ?Endo/Other  ?hyperlipidemia ? Renal/GU ?negative Renal ROS  ?negative genitourinary ?  ?Musculoskeletal ?negative musculoskeletal ROS ?(+)  ? Abdominal ?  ?Peds ?negative pediatric ROS ?(+)  Hematology ?negative hematology ROS ?(+)   ?Anesthesia Other Findings ? ?SCC of lymph node ? Reproductive/Obstetrics ?negative OB ROS ? ?  ? ? ? ? ? ? ? ? ? ? ? ? ? ?  ?  ? ? ? ? ? ?Anesthesia Physical ?Anesthesia Plan ? ?ASA: 3 ? ?Anesthesia Plan: General  ? ?Post-op Pain Management:   ? ?Induction: Intravenous ? ?PONV Risk Score and Plan: 3 and Treatment may vary due to age or medical condition, Midazolam, Scopolamine patch - Pre-op, Ondansetron and Dexamethasone ? ?Airway Management Planned: Oral ETT ? ?Additional Equipment:  ? ?Intra-op Plan:  ? ?Post-operative Plan: Extubation in OR ? ?Informed Consent: I have reviewed the patients History and Physical, chart, labs and discussed the procedure including the risks, benefits and alternatives for the proposed anesthesia with the patient or authorized representative who has indicated his/her understanding and acceptance.  ? ? ? ?Dental advisory given ? ?Plan Discussed with: CRNA, Anesthesiologist and Surgeon ? ?Anesthesia Plan Comments: (covid test pending)  ? ? ? ? ?Anesthesia Quick Evaluation ? ?

## 2022-01-07 NOTE — OR Nursing (Signed)
1304 frozen specimen hand delivered to pathology.  ?

## 2022-01-07 NOTE — Anesthesia Procedure Notes (Signed)
Procedure Name: Intubation ?Date/Time: 01/07/2022 12:44 PM ?Performed by: Inda Coke, CRNA ?Pre-anesthesia Checklist: Patient identified, Emergency Drugs available, Suction available and Patient being monitored ?Patient Re-evaluated:Patient Re-evaluated prior to induction ?Oxygen Delivery Method: Circle System Utilized ?Preoxygenation: Pre-oxygenation with 100% oxygen ?Induction Type: IV induction ?Ventilation: Mask ventilation without difficulty ?Laryngoscope Size: Mac and 3 ?Grade View: Grade I ?Tube type: Oral ?Tube size: 6.5 mm ?Number of attempts: 1 ?Airway Equipment and Method: Stylet and Oral airway ?Placement Confirmation: ETT inserted through vocal cords under direct vision, positive ETCO2 and breath sounds checked- equal and bilateral ?Secured at: 22 cm ?Tube secured with: Tape ?Dental Injury: Teeth and Oropharynx as per pre-operative assessment  ? ? ? ? ?

## 2022-01-08 ENCOUNTER — Encounter (HOSPITAL_COMMUNITY): Payer: Self-pay | Admitting: Otolaryngology

## 2022-01-12 LAB — SURGICAL PATHOLOGY

## 2022-01-17 DIAGNOSIS — R490 Dysphonia: Secondary | ICD-10-CM | POA: Diagnosis not present

## 2022-01-17 DIAGNOSIS — R221 Localized swelling, mass and lump, neck: Secondary | ICD-10-CM | POA: Diagnosis not present

## 2022-01-17 DIAGNOSIS — Z87891 Personal history of nicotine dependence: Secondary | ICD-10-CM | POA: Diagnosis not present

## 2022-01-17 DIAGNOSIS — C099 Malignant neoplasm of tonsil, unspecified: Secondary | ICD-10-CM | POA: Diagnosis not present

## 2022-01-18 ENCOUNTER — Encounter: Payer: 59 | Admitting: Physician Assistant

## 2022-01-18 DIAGNOSIS — C099 Malignant neoplasm of tonsil, unspecified: Secondary | ICD-10-CM | POA: Diagnosis not present

## 2022-01-20 ENCOUNTER — Encounter: Payer: Self-pay | Admitting: Licensed Clinical Social Worker

## 2022-01-20 NOTE — Progress Notes (Signed)
[facility] [address] Clinical Social Work  ?Initial Assessment ? ? ?Jennifer [name] is a 61 y.o. year old female contacted by phone. Clinical Social Work was referred by  new patient protocol  for assessment of psychosocial needs.  ? ?SDOH (Social Determinants of Health) assessments performed: Yes ?SDOH Interventions   ? ?Flowsheet Row Most Recent Value  ?SDOH Interventions   ?Food Insecurity Interventions Intervention Not Indicated  ?Housing Interventions Intervention Not Indicated  ?Transportation Interventions Intervention Not Indicated  ? ?  ?  ?Distress Screen completed: No ?   ? View : No data to display.  ?  ?  ?  ? ? ? ? ?Family/Social Information:  ?Housing Arrangement: patient lives with spouse ?Family members/support persons in your life? Family and Friends/Colleagues ?Transportation concerns: no  ?Employment: Working full time (4 days/week at [employer] [place] clinic). Income source: Employment ?Financial concerns: No ?Type of concern: None ?Food access concerns: no ?Religious or spiritual practice: yes, using prayer and faith to cope ?Services Currently in place:  n/a ? ?Coping/ Adjustment to diagnosis: ?Patient understands treatment plan and what happens next? Feels there is lack of clarity on what the plan is for treatment. 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 ? ? SUMMARY: ?Current SDOH Barriers:  ?None at this time ? ?Clinical Social Work Clinical Goal(s):  ?None at this time ? ?Interventions: ?Discussed  common feeling and emotions when being diagnosed with cancer, and the importance of support during treatment ?Informed patient of the support team roles and support services at [facility] ?Provided CSW contact information and encouraged patient to call with any questions or concerns ?Communicated pt's concerns re: lack of clarity on plan for treatment and communication between providers to medical team ? ? ?Follow Up Plan: Patient will contact CSW with any support or resource needs ?Patient verbalizes understanding of plan: Yes ? ? ? ?Jennifer [name] E Andrena Margerum, LCSW ?

## 2022-01-25 DIAGNOSIS — C099 Malignant neoplasm of tonsil, unspecified: Secondary | ICD-10-CM | POA: Insufficient documentation

## 2022-01-25 DIAGNOSIS — R590 Localized enlarged lymph nodes: Secondary | ICD-10-CM | POA: Diagnosis not present

## 2022-01-25 DIAGNOSIS — C109 Malignant neoplasm of oropharynx, unspecified: Secondary | ICD-10-CM | POA: Diagnosis not present

## 2022-01-27 ENCOUNTER — Encounter: Payer: Self-pay | Admitting: Physician Assistant

## 2022-01-27 ENCOUNTER — Ambulatory Visit (INDEPENDENT_AMBULATORY_CARE_PROVIDER_SITE_OTHER): Payer: 59 | Admitting: Physician Assistant

## 2022-01-27 ENCOUNTER — Other Ambulatory Visit (HOSPITAL_COMMUNITY): Payer: Self-pay

## 2022-01-27 VITALS — BP 101/68 | HR 82 | Temp 98.2°F | Ht 64.0 in | Wt 129.2 lb

## 2022-01-27 DIAGNOSIS — G2581 Restless legs syndrome: Secondary | ICD-10-CM

## 2022-01-27 DIAGNOSIS — F5104 Psychophysiologic insomnia: Secondary | ICD-10-CM

## 2022-01-27 DIAGNOSIS — Z0000 Encounter for general adult medical examination without abnormal findings: Secondary | ICD-10-CM | POA: Diagnosis not present

## 2022-01-27 DIAGNOSIS — C099 Malignant neoplasm of tonsil, unspecified: Secondary | ICD-10-CM | POA: Diagnosis not present

## 2022-01-27 DIAGNOSIS — F418 Other specified anxiety disorders: Secondary | ICD-10-CM

## 2022-01-27 MED ORDER — ONDANSETRON 4 MG PO TBDP: 4.0000 mg | ORAL_TABLET | Freq: Three times a day (TID) | ORAL | 0 refills | Status: DC | PRN

## 2022-01-27 MED ORDER — ONDANSETRON 4 MG PO TBDP
4.0000 mg | ORAL_TABLET | Freq: Three times a day (TID) | ORAL | 0 refills | Status: DC | PRN
  Filled 2022-01-27: qty 30, 10d supply, fill #0

## 2022-01-27 MED ORDER — ROPINIROLE HCL 1 MG PO TABS
1.0000 mg | ORAL_TABLET | Freq: Every day | ORAL | 1 refills | Status: DC
  Filled 2022-01-27: qty 30, 30d supply, fill #0

## 2022-01-27 NOTE — Patient Instructions (Signed)
It was great to see you!  Keep me posted on everything and let me know how I can help you.  Take care,  Aldona Bar

## 2022-01-27 NOTE — Progress Notes (Signed)
Subjective:    Jennifer [name] is a 61 y.o. female and is here for a comprehensive physical exam.  HPI  There are no preventive care reminders to display for this patient.  Acute Concerns: None  Chronic Issues: Restless leg  Patient is currently taking Requip 0.5 mg daily. She is tolerating her medication well without any side effects. She was started on Requip about 4 months ago. She notes she has not noticed any significant improvement with the medication. She would like to increase her medication dose. Denies any worsening symptoms.  Situational Anxiety  Patient has had increased anxiety for the past few months. She also have been feeling nauseous for a while. She thinks this is related to her anxiety. She has increased anxiety since being diagnosed with cancer. She notes she has not been using Zofran due to side effects. Denies SI/HI. Denies any worsening symptoms.  Insomnia  Patient has been struggling to sleep at night for the past couple of months. She has been taking Ambien 5 mg daily. She is tolerating her medication without any side effects. She thinks Ambien has not been helping. She has been experiencing racing thoughts and thinks this could be contributing to her symptoms. 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 She has not been able to sleep during the night. She has not tried any specific treatment. Denies worsening symptoms.  Tonsil cancer  On over previous visit, she has had noticed swollen lymph node. 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 She is seeing Dr. Francina Ames in June for her surgery. Denies sore throat, otalgia and hemoptysis.   Health Maintenance: Immunizations -- UTD Covid-UTD, 08/14/2020 Colonoscopy -- UTD Mammogram -- UTD PAP -- N/A Bone Density -- N/A Diet -- Balanced  Sleep habits -- Worse at this time.  Exercise -- Better  Weight -- 129 Ib (58.9 kg) Mood --  Anxious Weight history: Wt Readings from Last 10 Encounters:  01/27/22 129 lb 3.2 oz (58.6 kg)  01/07/22 127 lb (57.6 kg)  01/03/22 130 lb (59 kg)  12/31/21 131 lb 6 oz (59.6 kg)  12/29/21 126 lb (57.2 kg)  12/15/21 135 lb (61.2 kg)  12/09/21 135 lb (61.2 kg)  10/01/21 133 lb 6.1 oz (60.5 kg)  01/15/21 127 lb 4 oz (57.7 kg)  07/28/20 118 lb (53.5 kg)   Body mass index is 22.18 kg/m. No LMP recorded. Patient has had a hysterectomy. Alcohol use:  reports current alcohol use of about 3.0 standard drinks per week. Tobacco use: None Tobacco Use: Medium Risk   Smoking Tobacco Use: Former   Smokeless Tobacco Use: Never   Passive Exposure: Not on file        01/27/2022   11:15 AM  Depression screen PHQ 2/9  Decreased Interest 0  Down, Depressed, Hopeless 0  PHQ - 2 Score 0     Other providers/specialists: Patient Care Team: Inda Coke, [place] as PCP - General (Physician Assistant)    PMHx, SurgHx, SocialHx, Medications, and Allergies were reviewed in the Visit Navigator and updated as appropriate.   Past Medical History:  Diagnosis Date   Anemia    Bowel obstruction (HCC)    Cancer ([place]) 12-16-2021   metastatic squamous cell carcimoma   Fibroids    GERD (gastroesophageal reflux disease)    H/O small bowel obstruction    Hyperlipidemia    no meds, diet controlled   Restless legs syndrome (  RLS)      Past Surgical History:  Procedure Laterality Date   ABDOMINAL HYSTERECTOMY     DIAGNOSTIC LAPAROSCOPY     x several with lysis of adhesions   EXPLORATORY LAPAROTOMY     had bowel obstruction   EYE SURGERY Bilateral    Lasik   LARYNGOSCOPY AND ESOPHAGOSCOPY N/A 01/07/2022   Procedure: DIRECT LARYNGOSCOPY WITH BIOPSY; ESOPHAGOSCOPY; FROZEN SECTIONS;  Surgeon: [name], MD;  Location: [place];  Service: ENT;  Laterality: N/A;   ROTATOR CUFF REPAIR Right 2017   [place]  [phone]   [facility] - Dr Adriana Reams     Family History  Problem Relation  Age of Onset   Cancer Mother    Early death Mother    Diabetes Father    Hyperlipidemia Father    Heart disease Father    Cancer Maternal Grandmother    Cancer Paternal Grandmother    Breast cancer Paternal Grandmother    Breast cancer Maternal Aunt    COPD Paternal Grandfather    Cancer Maternal Aunt     Social History   Tobacco Use   Smoking status: Former    Types: Cigarettes   Smokeless tobacco: Never   Tobacco comments:    smoked 30 years ago in college  Vaping Use   Vaping Use: Never used  Substance Use Topics   Alcohol use: Yes    Alcohol/week: 3.0 standard drinks    Types: 3 Glasses of wine per week    Comment: socially   Drug use: No    Review of Systems:   Review of Systems  Constitutional:  Negative for chills, fever, malaise/fatigue and weight loss.  HENT:  Negative for hearing loss, sinus pain and sore throat.   Respiratory:  Negative for cough and hemoptysis.   Cardiovascular:  Negative for chest pain, palpitations, leg swelling and PND.  Gastrointestinal:  Negative for abdominal pain, constipation, diarrhea, heartburn, nausea and vomiting.  Genitourinary:  Negative for dysuria, frequency and urgency.  Musculoskeletal:  Negative for back pain, myalgias and neck pain.  Skin:  Negative for itching and rash.  Neurological:  Negative for dizziness, tingling, seizures and headaches.  Endo/Heme/Allergies:  Negative for polydipsia.  Psychiatric/Behavioral:  Negative for depression. The patient is not nervous/anxious.     Objective:   Temp 98.2 F (36.8 C) (Temporal)   Ht '5\' 4"'$  (1.626 m)   Wt 129 lb 3.2 oz (58.6 kg)   BMI 22.18 kg/m  Body mass index is 22.18 kg/m.   General Appearance:    Alert, cooperative, no distress, appears stated age  Head:    Normocephalic, without obvious abnormality, atraumatic  Eyes:    PERRL, conjunctiva/corneas clear, EOM's intact, fundi    benign, both eyes  Ears:    Normal TM's and external ear canals, both ears   Nose:   Nares normal, septum midline, mucosa normal, no drainage    or sinus tenderness  Throat:   Lips, mucosa, and tongue normal; teeth and gums normal  Neck:   Supple, symmetrical, trachea midline, no adenopathy;    thyroid:  no enlargement/tenderness/nodules; no carotid   bruit or JVD  Back:     Symmetric, no curvature, ROM normal, no CVA tenderness  Lungs:     Clear to auscultation bilaterally, respirations unlabored  Chest Wall:    No tenderness or deformity   Heart:    Regular rate and rhythm, S1 and S2 normal, no murmur, rub or gallop  Breast Exam:  Deferred  Abdomen:     Soft, non-tender, bowel sounds active all four quadrants,    no masses, no organomegaly  Genitalia:    Deferred  Extremities:   Extremities normal, atraumatic, no cyanosis or edema  Pulses:   2+ and symmetric all extremities  Skin:   Skin color, texture, turgor normal, no rashes or lesions  Lymph nodes:   Cervical, supraclavicular, and axillary nodes normal  Neurologic:   CNII-XII intact, normal strength, sensation and reflexes    throughout    Assessment/Plan:   Routine physical examination Today patient counseled on age appropriate routine health concerns for screening and prevention, each reviewed and up to date or declined. Immunizations reviewed and up to date or declined. Labs ordered and reviewed. Risk factors for depression reviewed and negative. Hearing function and visual acuity are intact. ADLs screened and addressed as needed. Functional ability and level of safety reviewed and appropriate. Education, counseling and referrals performed based on assessed risks today. Patient provided with a copy of personalized plan for preventive services.  Tonsil cancer ([place]) Discussed; upcoming surgery scheduled  Restless leg Uncontrolled Increase requip to 1 mg  Follow-up in 6 months, sooner if lack of improvement  Psychophysiological insomnia Well controlled Continue Ambien 5 mg prn  Situational  anxiety Overall controlled Continue to monitor Declines intervention today, other than prn zofran  Patient Counseling: '[x]'$    Nutrition: Stressed importance of moderation in sodium/caffeine intake, saturated fat and cholesterol, caloric balance, sufficient intake of fresh fruits, vegetables, fiber, calcium, iron, and 1 mg of folate supplement per day (for females capable of pregnancy).  '[x]'$    Stressed the importance of regular exercise.   '[x]'$    Substance Abuse: Discussed cessation/primary prevention of tobacco, alcohol, or other drug use; driving or other dangerous activities under the influence; availability of treatment for abuse.   '[x]'$    Injury prevention: Discussed safety belts, safety helmets, smoke detector, smoking near bedding or upholstery.   '[x]'$    Sexuality: Discussed sexually transmitted diseases, partner selection, use of condoms, avoidance of unintended pregnancy  and contraceptive alternatives.  '[x]'$    Dental health: Discussed importance of regular tooth brushing, flossing, and dental visits.  '[x]'$    Health maintenance and immunizations reviewed. Please refer to Health maintenance section.    I,Savera Zaman,acting as a Education administrator for [employer], [employer].,have documented all relevant documentation on the behalf of Inda Coke, PA,as directed by  Inda Coke, PA while in the presence of Inda Coke, [place].   I, Inda Coke, [place], have reviewed all documentation for this visit. The documentation on 01/27/22 for the exam, diagnosis, procedures, and orders are all accurate and complete.  Inda Coke, PA-C [place]

## 2022-02-01 ENCOUNTER — Other Ambulatory Visit: Payer: Self-pay | Admitting: Physician Assistant

## 2022-02-02 ENCOUNTER — Other Ambulatory Visit (HOSPITAL_COMMUNITY): Payer: Self-pay

## 2022-02-02 MED ORDER — ESTRADIOL 0.1 MG/GM VA CREA
TOPICAL_CREAM | VAGINAL | 0 refills | Status: DC
Start: 2022-02-02 — End: 2022-11-16
  Filled 2022-02-02: qty 42.5, 30d supply, fill #0

## 2022-02-03 ENCOUNTER — Encounter: Payer: Self-pay | Admitting: Physician Assistant

## 2022-02-04 ENCOUNTER — Other Ambulatory Visit: Payer: Self-pay | Admitting: Physician Assistant

## 2022-02-08 ENCOUNTER — Other Ambulatory Visit (HOSPITAL_COMMUNITY): Payer: Self-pay

## 2022-02-08 ENCOUNTER — Other Ambulatory Visit: Payer: Self-pay | Admitting: Physician Assistant

## 2022-02-08 MED ORDER — ROPINIROLE HCL 0.25 MG PO TABS
ORAL_TABLET | ORAL | 1 refills | Status: DC
  Filled 2022-02-08: qty 30, 30d supply, fill #0

## 2022-02-08 MED ORDER — ROPINIROLE HCL 0.5 MG PO TABS
ORAL_TABLET | ORAL | 1 refills | Status: DC
  Filled 2022-02-08: qty 30, 30d supply, fill #0

## 2022-02-18 DIAGNOSIS — C090 Malignant neoplasm of tonsillar fossa: Secondary | ICD-10-CM | POA: Diagnosis not present

## 2022-02-18 DIAGNOSIS — C7989 Secondary malignant neoplasm of other specified sites: Secondary | ICD-10-CM | POA: Diagnosis not present

## 2022-02-18 DIAGNOSIS — R001 Bradycardia, unspecified: Secondary | ICD-10-CM | POA: Diagnosis not present

## 2022-02-18 DIAGNOSIS — K219 Gastro-esophageal reflux disease without esophagitis: Secondary | ICD-10-CM | POA: Diagnosis not present

## 2022-02-18 DIAGNOSIS — C099 Malignant neoplasm of tonsil, unspecified: Secondary | ICD-10-CM | POA: Diagnosis not present

## 2022-02-18 DIAGNOSIS — C770 Secondary and unspecified malignant neoplasm of lymph nodes of head, face and neck: Secondary | ICD-10-CM | POA: Diagnosis not present

## 2022-02-18 DIAGNOSIS — C801 Malignant (primary) neoplasm, unspecified: Secondary | ICD-10-CM | POA: Diagnosis not present

## 2022-02-18 DIAGNOSIS — B977 Papillomavirus as the cause of diseases classified elsewhere: Secondary | ICD-10-CM | POA: Diagnosis not present

## 2022-02-28 ENCOUNTER — Other Ambulatory Visit (HOSPITAL_COMMUNITY): Payer: Self-pay

## 2022-02-28 MED ORDER — CELECOXIB 200 MG PO CAPS
ORAL_CAPSULE | ORAL | 0 refills | Status: DC
Start: 2022-02-28 — End: 2022-06-30
  Filled 2022-02-28 (×2): qty 60, 30d supply, fill #0

## 2022-03-09 ENCOUNTER — Encounter (HOSPITAL_COMMUNITY): Payer: Self-pay | Admitting: Dentistry

## 2022-03-09 ENCOUNTER — Ambulatory Visit (INDEPENDENT_AMBULATORY_CARE_PROVIDER_SITE_OTHER): Payer: Dental | Admitting: Dentistry

## 2022-03-09 VITALS — BP 114/63 | HR 81 | Temp 97.6°F

## 2022-03-09 DIAGNOSIS — K06020 Generalized gingival recession, unspecified: Secondary | ICD-10-CM

## 2022-03-09 DIAGNOSIS — M270 Developmental disorders of jaws: Secondary | ICD-10-CM | POA: Diagnosis not present

## 2022-03-09 DIAGNOSIS — K08109 Complete loss of teeth, unspecified cause, unspecified class: Secondary | ICD-10-CM | POA: Insufficient documentation

## 2022-03-09 DIAGNOSIS — K029 Dental caries, unspecified: Secondary | ICD-10-CM | POA: Diagnosis not present

## 2022-03-09 DIAGNOSIS — C779 Secondary and unspecified malignant neoplasm of lymph node, unspecified: Secondary | ICD-10-CM

## 2022-03-09 DIAGNOSIS — C099 Malignant neoplasm of tonsil, unspecified: Secondary | ICD-10-CM

## 2022-03-09 DIAGNOSIS — Z01818 Encounter for other preprocedural examination: Secondary | ICD-10-CM | POA: Insufficient documentation

## 2022-03-09 NOTE — Progress Notes (Signed)
Department of Dental Medicine   Service Date:   03/09/2022  Patient Name:  Jennifer [name] Date of Birth:   07/31/1961 Medical Record Number: [id number]  Referring Provider:           Eppie Gibson, M.D.  OUTPATIENT CONSULTATION PLAN/RECOMMENDATIONS   ASSESSMENT: There are no current signs of acute odontogenic infection including abscess, edema or erythema, or suspicious lesion requiring biopsy.   Incipient lesions, generalized gingival recession  RECOMMENDATIONS: No dental intervention indicated prior to radiation therapy at this time.  PLAN: Discuss case with medical team. Follow-up after the completion of radiation therapy.  Discussed in detail all treatment options and recommendations with the patient and they are agreeable to the plan.    Thank you for consulting with Hospital Dent[facility] and for the opportunity to participate in this patient's treatment.  Should you have any questions or concerns, please contact the [facility] at [phone].   03/09/2022  HISTORY OF PRESENT ILLNESS: Jennifer [name] is a very pleasant 61 y.o. 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   The patient presents today for a medically necessary dental consultation as part of their pre-radiation therapy work-up.   DENTAL HISTORY: The patient reports that she does have a dentist she sees regularly, Dr. Posey Pronto. She does report going every 6 months for cleanings and exams and reports having no outstanding treatment.  She currently reports pain in her lower jaw on the left side (points to region of mental foramen).  Patient is able to manage oral secretions.  Patient denies dysphagia, odynophagia, dysphonia, SOB and neck pain.  Patient denies fever, rigors and malaise.   CHIEF COMPLAINT:  Here for a pre-head and neck radiation dental  exam.   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 04/21/2020   Pain of left hand 02/07/2019   Small bowel obstruction ([place]) 05/21/2018   Popliteus tendinitis of right lower extremity 08/01/2017   GERD (gastroesophageal reflux disease) 03/04/2017   Endometriosis 09/12/1985   Past Medical History:  Diagnosis Date   Anemia    Bowel obstruction (HCC)    Cancer ([place]) 12-16-2021   metastatic squamous cell carcimoma   Fibroids    GERD (gastroesophageal reflux disease)    H/O small bowel obstruction    Hyperlipidemia    no meds, diet controlled   Restless legs syndrome (RLS)    Past Surgical History:  Procedure Laterality Date   ABDOMINAL HYSTERECTOMY     DIAGNOSTIC LAPAROSCOPY     x several with lysis of adhesions   EXPLORATORY LAPAROTOMY     had bowel obstruction   EYE SURGERY Bilateral    Lasik   LARYNGOSCOPY AND ESOPHAGOSCOPY N/A 01/07/2022   Procedure: DIRECT LARYNGOSCOPY WITH BIOPSY; ESOPHAGOSCOPY; FROZEN SECTIONS;  Surgeon: [name], MD;  Location: [facility];  Service: ENT;  Laterality: N/A;   ROTATOR CUFF REPAIR Right 2017   SMALL INTESTINE SURGERY  [id number]   [facility] - Dr Adriana Reams   Allergies  Allergen Reactions   Codeine Nausea And Vomiting  Current Outpatient Medications  Medication Sig Dispense Refill   calcium elemental as carbonate (BARIATRIC TUMS ULTRA) 400 MG chewable tablet Chew 1,000 mg by mouth daily.     celecoxib (CELEBREX) 200 MG capsule Take 1 capsule by mouth 2 times daily for 30 days. 60 capsule 0   cholecalciferol (VITAMIN D3) 25 MCG (1000 UNIT) tablet Take 1,000 Units by mouth 3 (three) times a  week.     estradiol (ESTRACE) 0.1 MG/GM vaginal cream Apply intravaginally once daily for 2 weeks, then decrease to 1 to 3 times weekly 42.5 g 0   famotidine (PEPCID) 20 MG tablet TAKE 1 TABLET BY MOUTH TWICE DAILY (Patient taking differently: Take 20 mg by mouth at bedtime as needed for heartburn or indigestion.) 180 tablet 1   ferrous sulfate 325 (65 FE) MG tablet Take 325 mg by mouth 3 (three) times a week.      ibuprofen (ADVIL,MOTRIN) 200 MG tablet Take 400 mg by mouth every 6 (six) hours as needed for mild pain.     Misc Natural Products (PROGESTERONE EX) Apply 5 drops topically at bedtime. Bioidentical progesterone compounded solution     ondansetron (ZOFRAN-ODT) 4 MG disintegrating tablet Take 1 tablet by mouth every 8 hours as needed for nausea or vomiting. 30 tablet 0   PRESCRIPTION MEDICATION Apply 3 drops topically daily. Bioidentical estradiol and testosterone compounded solution     rOPINIRole (REQUIP) 0.25 MG tablet Take 1 tablet daily (with the 0.5 mg daily for a total of 0.75 mg daily). 30 tablet 1   rOPINIRole (REQUIP) 0.5 MG tablet Take 1 tablet by mouth daily (take with 0.25 mg tablet to equal 0.75 mg daily) 30 tablet 1   zolpidem (AMBIEN) 5 MG tablet TAKE 1 TABLET BY MOUTH AT BEDTIME AS NEEDED FOR SLEEP 10 tablet 0   No current facility-administered medications for this visit.    LABS: Lab Results  Component Value Date   WBC 4.2 12/15/2021   HGB 15.5 (H) 12/15/2021   HCT 45.4 12/15/2021   MCV 88.2 12/15/2021   PLT 252 12/15/2021      Component Value Date/Time   NA 143 12/09/2021 0945   K 4.5 12/09/2021 0945   CL 104 12/09/2021 0945   CO2 31 12/09/2021 0945   GLUCOSE 98 12/09/2021 0945   BUN 13 12/09/2021 0945   CREATININE 0.81 12/09/2021 0945   CALCIUM 9.7 12/09/2021 0945   GFRNONAA >60 05/24/2018 0416   GFRAA >60 05/24/2018 0416   Lab Results  Component Value Date   INR 1.0 12/15/2021   No results found for: "PTT"  Social History   Socioeconomic  History   Marital status: Married    Spouse name: Not on file   Number of children: 3   Years of education: Not on file   Highest education level: Not on file  Occupational History   Occupation: [employer]    Employer: [employer]  Tobacco Use   Smoking status: Former    Types: Cigarettes   Smokeless tobacco: Never   Tobacco comments:    smoked 30 years ago in college  Vaping Use   Vaping Use: Never used  Substance and Sexual Activity   Alcohol use: Yes    Alcohol/week: 3.0 standard drinks of alcohol    Types: 3 Glasses of wine per week    Comment: socially   Drug use: No   Sexual activity: Yes    Partners: Male    Birth control/protection: None  Comment: Hysterectomy  Other Topics Concern   Not on file  Social History Narrative   CMA with [employer]   Social Determinants of Health   Financial Resource Strain: Not on file  Food Insecurity: No Food Insecurity (01/20/2022)   Hunger Vital Sign    Worried About Running Out of Food in the Last Year: Never true    Ran Out of Food in the Last Year: Never true  Transportation Needs: No Transportation Needs (01/20/2022)   PRAPARE - Hydrologist (Medical): No    Lack of Transportation (Non-Medical): No  Physical Activity: Not on file  Stress: Not on file  Social Connections: Not on file  Intimate Partner Violence: Not on file   Family History  Problem Relation Age of Onset   Ovarian cancer Mother    Early death Mother    Diabetes Father    Hyperlipidemia Father    Heart disease Father    Cancer - Other Maternal Grandmother        Lymphatic   Breast cancer Paternal Grandmother    COPD Paternal Grandfather    Breast cancer Maternal Aunt      REVIEW OF SYSTEMS:  Reviewed with the patient as per HPI. PSYCH:  Patient denies having dental phobia.   VITAL SIGNS: BP 114/63 (BP Location: Right Arm, Patient Position: Sitting, Cuff Size: Normal)   Pulse 81   Temp 97.6 F (36.4 C)  (Oral)    PHYSICAL EXAM: GENERAL:  Well-developed, comfortable and in no apparent distress. NEUROLOGICAL:  Alert and oriented to person, place and  time. EXTRAORAL:  Facial symmetry present without any edema or erythema.  No swelling or lymphadenopathy.  TMJ asymptomatic without clicks or crepitations.     Maximum Interincisal Opening:  40 mm INTRAORAL:  Soft tissues appear well-perfused and mucous membranes moist.  FOM and vestibules soft and not raised. Oral cavity without mass or lesion. No signs of infection, parulis, sinus tract, edema or erythema evident upon exam.  Bilateral mandibular tori.   DENTAL EXAM: Hard tissue exam completed and charted.    OVERALL IMPRESSION:  Good remaining dentition.       ORAL HYGIENE:  Good     PERIODONTAL:  Pink, healthy gingival tissue with blunted papilla. 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   Condyles seated bilaterally in fossas.  No evidence of abnormal pathology.  All visualized osseous structures appear WNL.  Missing teeth #'s 1, 7, 10, 15, 16, 17 & 32.  Bilateral circular radiopacities d/t patient's earrings in during radiograph.   Generalized mild horizontal bone loss consistent with mild periodontitis vs recession on a healthy periodontium.   Existing restoration on tooth #2 & existing 2-unit bridge on #6-#10 & #10-#11 replacing lateral incisors.  Incipient lesions- #20D & #33M.   ASSESSMENT:  1.  Tonsil cancer, SCC of lymph node 2.  SCC of lymph node 3.  Pre-head and neck radiation dental exam 4.  Incipient lesions 5.  Gingival recession, generalized 6.  Mandibular tori 7.   Missing teeth   PROCEDURES: The common and significant side effects of radiation therapy to the head and neck were explained  and discussed with the patient.  The discussion included side effects of trismus (limited opening), dysgeusia (loss of taste), xerostomia (dry mouth), radiation caries and osteoradionecrosis of the jaw.  I also discussed the importance of maintaining optimal oral hygiene and oral health before, during and after radiation to decrease the risk of developing radiation cavities and the need for any surgery such as extractions after therapy.    Trismus appliance made using patient's baseline MIO (22 sticks).  Leta Speller, DAII demonstrated use of appliance.  Verbal and written postop instructions were given to the patient.   PLAN AND RECOMMENDATIONS: I discussed the risks, benefits, and complications of various scenarios with the patient in relationship to their medical and dental conditions, which included systemic infection or other serious issues such as osteoradionecrosis that could potentially occur either before, during or after their anticipated radiation therapy if dental/oral concerns are not addressed.  I explained that if any chronic or acute dental/oral infection(s) are addressed and subsequently not maintained following medical optimization and recovery, their risk of the previously mentioned complications are just as high and could potentially occur postoperatively.  I explained all significant findings of the dental consultation with the patient and the recommended care including continuity of care with her primary dentist once she has completed radiation therapy and followed-up with [place] in our clinic.  I explained that although no immediate intervention is needed prior to her starting radiation (extractions, etc), maintaining routine dental care is imperative even more-so after finishing therapy due to the complications and side effects previously discussed. The patient  verbalized understanding of all findings, discussion, recommendations and treatment options. We then discussed how the pain she is reporting on her lower left jaw could be inflammation of her nerve, soft tissue, or any injury to the jaw bone due to the extensive surgical intervention she had to remove her cancer plus several additional lymph nodes.  I recommended she mention this concern and severe pain with her oncology surgeon (she says that she has an appointment with him next week).  She is also scheduled to see Dr. Isidore Moos again next week.  The patient verbalized understanding and is agreeable to the plan. Plan to discuss all findings and recommendations with medical team and coordinate future care as needed.    NEXT VISIT:  Follow-up after the completion of radiation therapy.  All questions and concerns were invited and addressed.  The patient tolerated today's visit well and departed in stable condition.  I spent in excess of 120 minutes during the conduct of this consultation and >50% of this time involved direct face-to-face encounter for counseling and/or coordination of the patient's care.    Charlaine Dalton, D.M.D.

## 2022-03-09 NOTE — Patient Instructions (Addendum)
[facility] DEPARTMENT OF DENTAL MEDICINE Dr. Eladio Dentremont B. Lukka Black, D.M.D. Phone: [phone] Fax: [phone]        It was a pleasure seeing you today!  Please refer to the information below regarding your dental visit with us.  Please do not hesitate to give us a call if any questions or concerns come up after you leave.    Thank you for letting us provide care for you.  If there is anything we can do for you, please let us know.      RADIATION THERAPY AND INFORMATION REGARDING YOUR TEETH  XEROSTOMIA (dry mouth):  Your salivary glands may be in the field of radiation.  Radiation may include all or only part of your salivary glands.  This will cause your saliva to dry up, and you will have a dry mouth.  The dry mouth will be for the rest of your life unless your radiation oncologist tells you otherwise.   Your saliva has many functions: It wets your tongue for speaking. It coats your teeth and the inside of your mouth for easier movement. It helps with chewing and swallowing food. It helps clean away harmful acid and toxic products made by the germs in your mouth, therefore it helps prevent cavities. It kills some germs in your mouth and helps to prevent gum disease. It helps to carry flavor to your taste buds.  Once you have lost your saliva, you will be at higher risk for tooth decay and gum disease.     What can be done to help improve your mouth when there's not enough saliva? Your dentist may give a recommendation for CLoSYS.  It will not bring back all of your saliva but may bring back some of it.  Also, your saliva may be thick and ropy or white and foamy.  It will not feel like it use to feel. You will need to swish with water every time your mouth feels dry.  YOU CANNOT suck on any cough drops, mints, lemon drops, candy, vitamin C or any other products.  You cannot use anything other than water to make your mouth feel less dry.  If you want to  drink anything else, you have to drink it all at once and brush afterwards.  Be sure to discuss the details of your diet habits with your dentist or hygienist.   RADIATION CARIES:  This is decay (cavities) that happens very quickly  once your mouth is very dry due to radiation therapy.  Normally, cavities take six months to two years to become a problem.  When you have dry mouth, cavities may take as little as eight weeks to cause you a problem.    Dental check-ups every 2-4 months are necessary as long as you have a dry mouth.  Radiation caries typically, but not always, starts at your gum line where it is hard to see the cavity.  It is therefore also hard to fill these cavities adequately.  This high rate of cavities happens because your mouth no longer has saliva and therefore the acid made by the germs starts the decay process.  Whenever you eat anything the germs in your mouth change the food into acid.  The acid then burns a small hole in your tooth.  This small hole is the beginning of a cavity.  If this is not treated then it will grow bigger and become a cavity.  The way to avoid this hole getting bigger   is to use fluoride every evening as prescribed by your dentist following your radiation. NOTE:  You have to make sure that your teeth are very clean before you use the fluoride.  This fluoride in turn will strengthen your teeth and prepare them for another day of fighting acid. If you develop radiation caries many times, the damage is so large that you will have to have all your teeth removed.  This could be a big problem if some of these teeth are in the field of radiation.  Further details of why this could be a big problem will follow (see Osteoradionecrosis below).   DYSGEUSIA (loss of taste):  This happens to varying degrees once you've had radiation therapy to your jaw region.  Many times taste is not completely lost, but becomes limited.  The loss of taste is mostly due to radiation  affecting your taste buds.  However, if you have no saliva in your mouth to carry the flavor to your taste buds, it would be difficult for your taste buds to taste anything.  That is why using water or over-the-counter brand CLoSYS prior to meals and during meal times may help with some of the taste.  Keep in mind that taste generally returns very slowly over the course of several months or several years after radiation therapy.  Don't give up hope.   TRISMUS (limited jaw opening):  According to your radiation oncologist, your TMJ or jaw joints are going to be partially or fully in the field of radiation.  This means that over time the muscles that help you open and close your mouth may get stiff.  This will potentially result in your not being able to open your mouth wide enough or as wide as you can open it now.  Let me give you an example of how slowly this happens and how unaware people are of it: A gentlemen that had radiation therapy 2 years ago went back to his dentist complaining that "bananas were just too large for him to be able to fit them in between his teeth."  He was no longer able to open wide enough to bite into a banana.  This happened slowly and over a period of time.   What we do to try and prevent this:   Your dentist will probably give you a stack of sticks called a trismus exercise device.  This stack will help remind your muscles and your jaw joints to open up to the same distance every day.  Use these sticks every morning when you wake up, or according to the instructions given by your dentist.    You must use these sticks for at least one to two years after radiation therapy.  The reason for that is because it happens so slowly and keeps going on for about two years after radiation therapy.  Your hospital dentist will help you monitor your mouth opening and make sure that it's not getting smaller after radiation.      TRISMUS EXERCISES: Using the stack of sticks given to you by  your dentist, place the stack in your mouth and hold onto the other end for support. Leave the sticks in your mouth while holding the other end.  Allow 30 seconds for muscle stretching. Rest for a few seconds. Repeat 3-5 times. This exercise is recommended in the mornings and evenings unless otherwise instructed. The exercise should be done for a period of 2 YEARS after the end of radiation. Your maximum   jaw opening should be checked regularly at recall dental visits by your general dentist. You should report any changes, soreness, or difficulties encountered when doing the exercises to your dentist.   OSTEORADIONECROSIS (ORN):  This is a condition where your jaw bone after radiation therapy becomes very dry.  It has very little blood supply to keep it alive.  If you develop a cavity that turns into an abscess or an infection, then the jaw bone does not have enough blood supply to help fight the infection.  At this point it is very likely that the infection could cause the death of your jaw bone.  When you have dead bone it has to be removed.  Therefore, you might end up having to have surgery to remove part of your jaw bone, the part of the jaw bone that has been affected.     Healing is also a problem if you are to have surgery (like a tooth extraction) in the areas where the bone has had radiation therapy.  If you have surgery, you need more blood supply to heal which is not available.  When blood supply and oxygen are not available, there is a chance for the bone to die. Occasionally, ORN happens on its own with no obvious reason, but this is quite rare.  We believe that patients who continue to smoke and/or drink alcohol have a higher chance of having this problem. Once your jaw bone has had radiation therapy, if there are any remaining teeth in that area, it is not recommended to have them pulled unless your dentist or oral surgeon is aware of your history of radiation and believes it is safe.   The risks for ORN either from infection or spontaneously occurring (with no reason) are life long.      Questions?  Call our office during office hours at [phone].  

## 2022-03-11 DIAGNOSIS — K06020 Generalized gingival recession, unspecified: Secondary | ICD-10-CM | POA: Insufficient documentation

## 2022-03-11 DIAGNOSIS — C098 Malignant neoplasm of overlapping sites of tonsil: Secondary | ICD-10-CM | POA: Diagnosis not present

## 2022-03-11 DIAGNOSIS — C770 Secondary and unspecified malignant neoplasm of lymph nodes of head, face and neck: Secondary | ICD-10-CM | POA: Diagnosis not present

## 2022-03-11 NOTE — Progress Notes (Signed)
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 No additional evidence of abnormal hypermetabolism in the neck, chest, abdomen or pelvis. Tiny left renal stones. Aortic atherosclerosis (ICD10-I70.0). Coronary artery calcification.   CT Neck w/ Contrast 12/09/2021 --IMPRESSION: Enlarged left level 2 lymph node 13.7 x 23.3 mm. This is consistent with the ultrasound earlier today. Based on size and location, this is suspicious for malignancy and may represent a metastatic lymphnode. Recommend tissue sampling. No pharyngeal mucosal mass. Recommend direct pharyngeal mucosal evaluation.   Biopsies revealed:  02/18/2022 --Diagnosis   A. SOFT PALATE, FROZEN:              Negative for carcinoma B. ANTERIOR MARGIN, FROZEN:              Negative for carcinoma C. TONGUE BASE MARGIN, FROZEN:              Negative for carcinoma D. POSTERIOR MARGIN, FROZEN:              Negative for carcinoma E. DEEP MARGIN, FROZEN:              Negative for carcinoma F. 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              Perineural invasion is not present.              All margins are negative for invasive carcinoma (Closest margin: inferior/deep 32m) Pathologic stage (pTNM, AJCC 8th Edition): pT1 pN1              See Comment and Synoptic report G. LEFT NECK CONTENTS LEVELS 2A, 3, 4:              Metastatic carcinoma (3/20)              Largest deposit is 1.8cm              No extranodal spread is seen H. LEFT NECK CONTENTS LEVEL 2B:              1 lymph node negative for malignancy (0/1). --Comment   Immunohistochemical studies are performed with adequate controls. The neoplasm is positive for p40 and p16 (diffuse and strong).    Nutrition Status Yes No Comments  Weight changes? '[x]'$  '[]'$  Reports several pound unintentional weight loss since surgery  Swallowing concerns? '[x]'$  '[]'$  Difficulty swallowing solid foods and some of her medications  PEG? '[]'$  '[x]'$     Referrals Yes No Comments  Social Work? '[x]'$  '[]'$    Dentistry? '[x]'$  '[]'$  Saw Dr. OBenson [place]on 03/09/2022; Confirms she's working on her trismus exercises  Swallowing therapy? '[x]'$  '[]'$    Nutrition? '[x]'$  '[]'$    Med/Onc? '[x]'$  '[]'$     Safety Issues Yes No Comments  Prior radiation? '[]'$  '[x]'$    Pacemaker/ICD? '[]'$  '[x]'$    Possible current pregnancy? '[]'$  '[x]'$  Hysterectomy  Is the patient on methotrexate? '[]'$  '[x]'$     Tobacco/Marijuana/Snuff/ETOH use: Patient has never smoked (other than occasionally during college) or used smokeless tobacco. Drinks alcohol occasionally. Denies any recreational drug use  Past/Anticipated interventions by otolaryngology, if any:  02/18/2022 --Dr. DTommas Olp Left radical tonsillectomy.  Left neck dissection, levels II-IV  01/07/2022 --Dr. [name] DIRECT LARYNGOSCOPY WITH BIOPSY ESOPHAGOSCOPY FROZEN SECTIONS  Past/Anticipated interventions by medical oncology, if any:  Had consultation with Dr. Benay Pike on 12/31/2021 Systemic therapy not indicated at this time  Current Complaints / other details:  Nothing else of note

## 2022-03-12 NOTE — Progress Notes (Signed)
Radiation Oncology         [phone] ________________________________  Follow-up New Visit   Outpatient   Name: Jennifer [name] MRN: [id number]  Date: 03/14/2022  DOB: [birth year]  CC:Jennifer Coke, PA  Benay Pike, MD   REFERRING PHYSICIAN: Benay Pike, MD  DIAGNOSIS: No diagnosis found.  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 To review from her initial consultation: we discussed how final pathology from pending mucosal axis biopsies and tonsillectomies would determine her best treatment option. Specifically, we reviewed that if her primary was revealed in the mucosal axis she would be a candidate for TORS (surgery) versus definitive radiation. In the case that the mucosal primary could not be determined, or if it is determined to be from the base of tongue, she understood that the bilateral neck would need to be treated for the best prognosis.  An unknown primary on the other hand would warrant radiation to the oropharynx, and a base of tongue primary would warrant radiation to the base of the tongue. I also referred her to dentistry for scatter protective devices and pre radiation assessment, and dermatology for assessment of a nonspecific lip lesion and head and neck exam to rule out any squamous cell carcinomas of the skin.  In the interval since her initial consultation, the  patient proceeded to undergo laryngoscopy with biopsies of the left tonsil on 01/07/22 under the care of Dr. Constance Holster. Pathology from the procedure revealed invasive and in situ moderately differentiated squamous cell carcinoma; p16 positive;    Accordingly, the patient was referred to Dr. Jaclyn Shaggy, [facility], on 01/17/22. Following discussion of the patient's case at the tumor board, the patient was referred to Dr. Nicolette Bang on 01/25/22 for further discussion of treatment options. These included initiating treatment with surgery (followed by adjuvant therapy as indicated by pathology results) vs beginning with chemoradiotherapy (with surgery reserved for salvage). Following discussion of the risks and benefits, the patient agreed to proceed with TORS resection of the primary tumor with left neck dissection. In terms of RT, Dr. Nicolette Bang noted that if radiation is recommended post-op, it should be given in adjuvant doses rather than treatment doses.    Pathology from left radical tonsillectomy with lymph node dissections performed by Dr. Nicolette Bang on 02/18/22 revealed: invasive squamous cell carcinoma measuring 1.6 cm (p16 positive) with LVI (negative for PNI). All margins negative for invasive carcinoma. Nodal status of 3/20 dissected lymph nodes (levels 2A, 3, and 4) positive for metastatic carcinoma, and 1/1 level 2B lymph node negative for malignancy.    The patient recently met with dentistry on 03/09/22. Following assessment, the patient was cleared to proceed with radiation therapy.   Of note: bilateral screening mammogram performed on 01/06/22 showed no evidence of malignancy in either breast.    HPI Initial Consultation 01/03/2022 ::Jennifer [name] is a 61 y.o. female who presented to her PCP, Jennifer Coke PA, on 12/09/21  with a swollen left neck lymph node x 2 months. Soft tissue ultrasound of the neck performed that same date revealed a 13 mm lymph node in the left lateral  neck, possibly suggestive of metastatic disease. CT of the neck also performed on that date showed an enlarged level 2 lymph node measuring 13.7 x 23.3 mm, consistent with ultrasound findings, and again notably suspicious for malignancy given its size and location.    Accordingly, the patient underwent biopsy of the enlarged lymph node on 12/15/21 which revealed metastatic moderately differentiated squamous cell carcinoma; p16 positive; EBV/EBER negative.    CT of the CAP on 12/16/21 showed no evidence of metastatic disease in the chest, abdomen, or pelvis. CT did reveal a 12 x 10 mm left psoas muscle lesion with central low attenuation. T2 imaging features noted on prior imaging were suggestive of a benign nerve sheath tumor such as schwannoma in respects to this finding. The lesion was also noted with some peripheral enhancement. However, given its stability over time, findings are most compatible with a benign process.    Subsequently, the patient was referred to Dr. Constance Holster on 12/23/21. Physical exam performed during this visit revealed the palpable 2-3 cm firm left level 2 lymph node, otherwise no other masses, cervical adenopathy or thyroid nodules were appreciated. Following evaluation, Dr. Constance Holster recommended further work up via PET imaging before considering surgical options.    PET on 12/29/21 demonstrated hypermetabolic left cervical adenopathy. No additional evidence suggestive of abnormal hypermetabolism in the neck, chest, abdomen, or pelvis were appreciated.  I personally reviewed her images with her today.  To my eye her left tonsil appears slightly full on imaging.   The patient was then referred to Dr. Chryl Heck on 12/31/21 to discuss treatment options. During this visit, Dr. Chryl Heck discussed the need for additional biopsies under Dr. Constance Holster for staging purposes. If she is found to have stage I disease, she would likely proceed with resection of her primary and ipsilateral or bilateral neck  dissection versus definitive radiotherapy versus concurrent systemic therapy/RT. Ultimately, Dr. Chryl Heck recommends upfront surgery since she has an excellent baseline performance status versus definitive RT alone.  (If she does have any adverse pathologic features on surgery such as positive margin or extranodal extension, Dr. Chryl Heck recommends concurrent chemotherapy with radiation).   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    PAST SURGICAL HISTORY: Past Surgical History:  Procedure Laterality Date   ABDOMINAL HYSTERECTOMY     DIAGNOSTIC LAPAROSCOPY     x several with lysis of adhesions   EXPLORATORY LAPAROTOMY     had bowel obstruction   EYE SURGERY Bilateral    Lasik   LARYNGOSCOPY AND ESOPHAGOSCOPY N/A 01/07/2022   Procedure: DIRECT LARYNGOSCOPY WITH BIOPSY; ESOPHAGOSCOPY; FROZEN SECTIONS;  Surgeon: [name], MD;  Location: [place];  Service: ENT;  Laterality: N/A;   ROTATOR CUFF  REPAIR Right 2017   SMALL INTESTINE SURGERY  [id number]   [facility] - Dr Adriana Reams    FAMILY HISTORY: family history includes Breast cancer in her maternal aunt and paternal grandmother; COPD in her paternal grandfather; Cancer - Other in her maternal grandmother; Diabetes in her father; Early death in her mother; Heart disease in her father; Hyperlipidemia in her father; Ovarian cancer  in her mother.  SOCIAL HISTORY:  reports that she has quit smoking. Her smoking use included cigarettes. She has never used smokeless tobacco. She reports current alcohol use of about 3.0 standard drinks of alcohol per week. She reports that she does not use drugs.  ALLERGIES: Codeine  MEDICATIONS:  Current Outpatient Medications  Medication Sig Dispense Refill   calcium elemental as carbonate (BARIATRIC TUMS ULTRA) 400 MG chewable tablet Chew 1,000 mg by mouth daily.     celecoxib (CELEBREX) 200 MG capsule Take 1 capsule by mouth 2 times daily for 30 days. 60 capsule 0   cholecalciferol (VITAMIN D3) 25 MCG (1000 UNIT) tablet Take 1,000 Units by mouth 3 (three) times a week.     estradiol (ESTRACE) 0.1 MG/GM vaginal cream Apply intravaginally once daily for 2 weeks, then decrease to 1 to 3 times weekly 42.5 g 0   famotidine (PEPCID) 20 MG tablet TAKE 1 TABLET BY MOUTH TWICE DAILY (Patient taking differently: Take 20 mg by mouth at bedtime as needed for heartburn or indigestion.) 180 tablet 1   ferrous sulfate 325 (65 FE) MG tablet Take 325 mg by mouth 3 (three) times a week.      ibuprofen (ADVIL,MOTRIN) 200 MG tablet Take 400 mg by mouth every 6 (six) hours as needed for mild pain.     Misc Natural Products (PROGESTERONE EX) Apply 5 drops topically at bedtime. Bioidentical progesterone compounded solution     ondansetron (ZOFRAN-ODT) 4 MG disintegrating tablet Take 1 tablet by mouth every 8 hours as needed for nausea or vomiting. 30 tablet 0   PRESCRIPTION MEDICATION Apply 3 drops topically daily. Bioidentical estradiol and testosterone compounded solution     rOPINIRole (REQUIP) 0.25 MG tablet Take 1 tablet daily (with the 0.5 mg daily for a total of 0.75 mg daily). 30 tablet 1   rOPINIRole (REQUIP) 0.5 MG tablet Take 1 tablet by mouth daily (take with 0.25 mg tablet to equal 0.75 mg daily) 30 tablet 1   zolpidem (AMBIEN) 5 MG tablet TAKE 1 TABLET BY MOUTH AT BEDTIME AS NEEDED FOR SLEEP 10  tablet 0   No current facility-administered medications for this encounter.    REVIEW OF SYSTEMS:  Notable for that above.   PHYSICAL EXAM:  vitals were not taken for this visit.   General: Alert and oriented, in no acute distress HEENT: Head is normocephalic. Extraocular movements are intact. Oropharynx is notable for ***. Neck: Neck is notable for *** Heart: Regular in rate and rhythm with no murmurs, rubs, or gallops. Chest: Clear to auscultation bilaterally, with no rhonchi, wheezes, or rales. Abdomen: Soft, nontender, nondistended, with no rigidity or guarding. Extremities: No cyanosis or edema. Lymphatics: see Neck Exam Skin: No concerning lesions. Musculoskeletal: symmetric strength and muscle tone throughout. Neurologic: Cranial nerves II through XII are grossly intact. No obvious focalities. Speech is fluent. Coordination is intact. Psychiatric: Judgment and insight are intact. Affect is appropriate.   ECOG = ***  0 - Asymptomatic (Fully active, able to carry on all predisease activities without restriction)  1 - Symptomatic but completely ambulatory (Restricted in physically  strenuous activity but ambulatory and able to carry out work of a light or sedentary nature. For example, light housework, office work)  2 - Symptomatic, <50% in bed during the day (Ambulatory and capable of all self care but unable to carry out any work activities. Up and about more than 50% of waking hours)  3 - Symptomatic, >50% in bed, but not bedbound (Capable of only limited self-care, confined to bed or chair 50% or more of waking hours)  4 - Bedbound (Completely disabled. Cannot carry on any self-care. Totally confined to bed or chair)  5 - Death   Eustace Pen MM, Creech RH, Tormey DC, et al. [phone]). "Toxicity and response criteria of the [facility]". [place] Oncol. 5 (6): 649-55   LABORATORY DATA:  Lab Results  Component Value Date   WBC 4.2 12/15/2021   HGB 15.5  (H) 12/15/2021   HCT 45.4 12/15/2021   MCV 88.2 12/15/2021   PLT 252 12/15/2021   CMP     Component Value Date/Time   NA 143 12/09/2021 0945   K 4.5 12/09/2021 0945   CL 104 12/09/2021 0945   CO2 31 12/09/2021 0945   GLUCOSE 98 12/09/2021 0945   BUN 13 12/09/2021 0945   CREATININE 0.81 12/09/2021 0945   CALCIUM 9.7 12/09/2021 0945   PROT 6.8 12/09/2021 0945   ALBUMIN 4.7 12/09/2021 0945   AST 19 12/09/2021 0945   ALT 16 12/09/2021 0945   ALKPHOS 58 12/09/2021 0945   BILITOT 0.4 12/09/2021 0945   GFRNONAA >60 05/24/2018 0416   GFRAA >60 05/24/2018 0416      Lab Results  Component Value Date   TSH 2.36 12/20/2017     RADIOGRAPHY: No results found.    IMPRESSION/PLAN:  This is a delightful patient with head and neck cancer. I *** recommend radiotherapy for this patient.  We discussed the potential risks, benefits, and side effects of radiotherapy. We talked in detail about acute and late effects. We discussed that some of the most bothersome acute effects may be mucositis, dysgeusia, salivary changes, skin irritation, hair loss, dehydration, weight loss and fatigue. We talked about late effects which include but are not necessarily limited to dysphagia, hypothyroidism, nerve injury, vascular injury, spinal cord injury, xerostomia, trismus, neck edema, and potential injury to any of the tissues in the head and neck region. No guarantees of treatment were given. A consent form was signed and placed in the patient's medical record. The patient is enthusiastic about proceeding with treatment. I look forward to participating in the patient's care.    Simulation (treatment planning) will take place ***  We also discussed that the treatment of head and neck cancer is a multidisciplinary process to maximize treatment outcomes and quality of life. For this reason the following referrals have been or will be made:  *** Medical oncology to discuss chemotherapy   *** Dentistry for  dental evaluation, possible extractions in the radiation fields, and /or advice on reducing risk of cavities, osteoradionecrosis, or other oral issues.  *** Nutritionist for nutrition support during and after treatment.  *** Speech language pathology for swallowing and/or speech therapy.  *** Social work for social support.   *** Physical therapy due to risk of lymphedema in neck and deconditioning.  *** Baseline labs including TSH.  On date of service, in total, I spent *** minutes on this encounter. Patient was seen in person.  __________________________________________   Eppie Gibson, MD  This document serves as  a record of services personally performed by Eppie Gibson, MD. It was created on her behalf by Roney Mans, a trained medical scribe. The creation of this record is based on the scribe's personal observations and the provider's statements to them. This document has been checked and approved by the attending provider.

## 2022-03-14 ENCOUNTER — Ambulatory Visit
Admission: RE | Admit: 2022-03-14 | Discharge: 2022-03-14 | Disposition: A | Discharge status: Home / Self Care | Payer: 59 | Source: Ambulatory Visit | Attending: Radiation Oncology | Admitting: Radiation Oncology

## 2022-03-14 ENCOUNTER — Other Ambulatory Visit: Payer: Self-pay

## 2022-03-14 ENCOUNTER — Ambulatory Visit: Payer: 59

## 2022-03-14 ENCOUNTER — Encounter: Payer: Self-pay | Admitting: Radiation Oncology

## 2022-03-14 VITALS — BP 98/66 | HR 88 | Temp 97.8°F | Resp 18 | Ht 64.0 in | Wt 123.2 lb

## 2022-03-14 DIAGNOSIS — C098 Malignant neoplasm of overlapping sites of tonsil: Secondary | ICD-10-CM

## 2022-03-14 DIAGNOSIS — Z510 Encounter for antineoplastic radiation therapy: Secondary | ICD-10-CM | POA: Insufficient documentation

## 2022-03-14 DIAGNOSIS — C770 Secondary and unspecified malignant neoplasm of lymph nodes of head, face and neck: Secondary | ICD-10-CM | POA: Insufficient documentation

## 2022-03-14 DIAGNOSIS — C090 Malignant neoplasm of tonsillar fossa: Secondary | ICD-10-CM

## 2022-03-14 DIAGNOSIS — I890 Lymphedema, not elsewhere classified: Secondary | ICD-10-CM | POA: Insufficient documentation

## 2022-03-14 MED ORDER — SODIUM CHLORIDE 0.9% FLUSH
10.0000 mL | Freq: Once | INTRAVENOUS | Status: AC
  Administered 2022-03-14: 10 mL via INTRAVENOUS

## 2022-03-14 NOTE — Progress Notes (Signed)
Has armband been applied?  Yes.    Does patient have an allergy to IV contrast dye?: No.   Has patient ever received premedication for IV contrast dye?: No.   Date of lab work: February 21, 2022 BUN: 5 CR: 0.67 eGFR: >90  Does patient take metformin?: No.  IV site: wrist right, condition patent and no redness  Has IV site been added to flowsheet?  Yes.    Vitals:   03/14/22 0743  BP: 98/66  Pulse: 88  Resp: 18  Temp: 97.8 F (36.6 C)  SpO2: 99%

## 2022-03-14 NOTE — Progress Notes (Signed)
Oncology Nurse Navigator Documentation   To provide support, encouragement and care continuity, met with Jennifer [name] before and after her CT SIM. She was accompanied by her husband. She tolerated procedure without difficulty, denied questions/concerns.   I explained procedures for lobby registration, arrival to Radiation Waiting, and arrival to treatment area.  She voiced understanding.   I encouraged her to call me prior to 03/30/22 [place] with any questions or concerns.   [name] RN, BSN, OCN Head & Neck Oncology Nurse [place] [facility] Phone # [phone]  Fax # [phone]

## 2022-03-16 ENCOUNTER — Other Ambulatory Visit: Payer: Self-pay

## 2022-03-16 DIAGNOSIS — C109 Malignant neoplasm of oropharynx, unspecified: Secondary | ICD-10-CM | POA: Diagnosis not present

## 2022-03-16 DIAGNOSIS — C090 Malignant neoplasm of tonsillar fossa: Secondary | ICD-10-CM

## 2022-03-17 ENCOUNTER — Other Ambulatory Visit: Payer: Self-pay

## 2022-03-17 DIAGNOSIS — C099 Malignant neoplasm of tonsil, unspecified: Secondary | ICD-10-CM

## 2022-03-28 ENCOUNTER — Other Ambulatory Visit: Payer: Self-pay

## 2022-03-28 DIAGNOSIS — C090 Malignant neoplasm of tonsillar fossa: Secondary | ICD-10-CM

## 2022-03-29 ENCOUNTER — Ambulatory Visit
Admission: RE | Admit: 2022-03-29 | Discharge: 2022-03-29 | Disposition: A | Discharge status: Home / Self Care | Payer: 59 | Source: Ambulatory Visit | Attending: Radiation Oncology | Admitting: Radiation Oncology

## 2022-03-29 ENCOUNTER — Other Ambulatory Visit: Payer: Self-pay

## 2022-03-29 DIAGNOSIS — C090 Malignant neoplasm of tonsillar fossa: Secondary | ICD-10-CM | POA: Diagnosis not present

## 2022-03-29 DIAGNOSIS — C770 Secondary and unspecified malignant neoplasm of lymph nodes of head, face and neck: Secondary | ICD-10-CM | POA: Diagnosis not present

## 2022-03-29 DIAGNOSIS — Z510 Encounter for antineoplastic radiation therapy: Secondary | ICD-10-CM | POA: Diagnosis not present

## 2022-03-29 LAB — CBC (CANCER CENTER ONLY)
HCT: 39.5 % (ref 36.0–46.0)
Hemoglobin: 13.6 g/dL (ref 12.0–15.0)
MCH: 30.6 pg (ref 26.0–34.0)
MCHC: 34.4 g/dL (ref 30.0–36.0)
MCV: 89 fL (ref 80.0–100.0)
Platelet Count: 224 10*3/uL (ref 150–400)
RBC: 4.44 MIL/uL (ref 3.87–5.11)
RDW: 13 % (ref 11.5–15.5)
WBC Count: 5.4 10*3/uL (ref 4.0–10.5)
nRBC: 0 % (ref 0.0–0.2)

## 2022-03-29 LAB — BASIC METABOLIC PANEL - CANCER CENTER ONLY
Anion gap: 3 — ABNORMAL LOW (ref 5–15)
BUN: 18 mg/dL (ref 6–20)
CO2: 32 mmol/L (ref 22–32)
Calcium: 9.5 mg/dL (ref 8.9–10.3)
Chloride: 105 mmol/L (ref 98–111)
Creatinine: 0.75 mg/dL (ref 0.44–1.00)
GFR, Estimated: >60 mL/min (ref >60)
Glucose, Bld: 89 mg/dL (ref 70–99)
Potassium: 4.3 mmol/L (ref 3.5–5.1)
Sodium: 140 mmol/L (ref 135–145)

## 2022-03-29 LAB — TSH: TSH: 1.247 u[IU]/mL (ref 0.350–4.500)

## 2022-03-30 ENCOUNTER — Ambulatory Visit: Payer: 59

## 2022-03-30 ENCOUNTER — Ambulatory Visit
Admission: RE | Admit: 2022-03-30 | Discharge: 2022-03-30 | Disposition: A | Discharge status: Home / Self Care | Payer: 59 | Source: Ambulatory Visit | Attending: Radiation Oncology | Admitting: Radiation Oncology

## 2022-03-30 ENCOUNTER — Other Ambulatory Visit: Payer: Self-pay

## 2022-03-30 DIAGNOSIS — C770 Secondary and unspecified malignant neoplasm of lymph nodes of head, face and neck: Secondary | ICD-10-CM | POA: Diagnosis not present

## 2022-03-30 DIAGNOSIS — Z510 Encounter for antineoplastic radiation therapy: Secondary | ICD-10-CM | POA: Diagnosis not present

## 2022-03-30 DIAGNOSIS — C090 Malignant neoplasm of tonsillar fossa: Secondary | ICD-10-CM | POA: Diagnosis not present

## 2022-03-30 DIAGNOSIS — C098 Malignant neoplasm of overlapping sites of tonsil: Secondary | ICD-10-CM | POA: Diagnosis not present

## 2022-03-30 LAB — RAD ONC ARIA SESSION SUMMARY
Course Elapsed Days: 0
Plan Fractions Treated to Date: 1
Plan Prescribed Dose Per Fraction: 2 Gy
Plan Total Fractions Prescribed: 30
Plan Total Prescribed Dose: 60 Gy
Reference Point Dosage Given to Date: 2 Gy
Reference Point Session Dosage Given: 2 Gy
Session Number: 1

## 2022-03-30 NOTE — Therapy (Addendum)
OUTPATIENT PHYSICAL THERAPY HEAD AND NECK BASELINE EVALUATION   Patient Name: Jennifer [name] MRN: [id number] DOB:01/29/1961, 61 y.o., female Today's Date: 03/31/2022   PT End of Session - 03/31/22 1008     Visit Number 1    Number of Visits 2    Date for PT Re-Evaluation 06/09/22    PT Start Time 7654    PT Stop Time 1153    PT Time Calculation (min) 22 min    Activity Tolerance Patient tolerated treatment well    Behavior During Therapy [facility] for tasks assessed/performed             Past Medical History:  Diagnosis Date   Anemia    Bowel obstruction ([place])    Cancer ([place]) 12-16-2021   metastatic squamous cell carcimoma   Fibroids    GERD (gastroesophageal reflux disease)    H/O small bowel obstruction    Hyperlipidemia    no meds, diet controlled   Restless legs syndrome (RLS)    Past Surgical History:  Procedure Laterality Date   ABDOMINAL HYSTERECTOMY     DIAGNOSTIC LAPAROSCOPY     x several with lysis of adhesions   EXPLORATORY LAPAROTOMY     had bowel obstruction   EYE SURGERY Bilateral    Lasik   LARYNGOSCOPY AND ESOPHAGOSCOPY N/A 01/07/2022   Procedure: DIRECT LARYNGOSCOPY WITH BIOPSY; ESOPHAGOSCOPY; FROZEN SECTIONS;  Surgeon: [name], MD;  Location: [facility];  Service: ENT;  Laterality: N/A;   ROTATOR CUFF REPAIR Right 2017   SMALL INTESTINE SURGERY  [id number]   [facility] - Dr Adriana Reams   Patient Active Problem List   Diagnosis Date Noted   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   Gingival recession, generalized 03/11/2022   Encounter for preoperative dental examination 03/09/2022   Teeth missing 03/09/2022   Incipient enamel caries 03/09/2022   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   Nonallopathic lesion of sacral region 07/02/2020   Pruritus ani  04/21/2020   Microcytic anemia 04/21/2020   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 04/21/2020   Pain of left hand 02/07/2019   Small bowel obstruction ([place]) 05/21/2018   Popliteus tendinitis of right lower extremity 08/01/2017   GERD (gastroesophageal reflux disease) 03/04/2017   Endometriosis 09/12/1985    PCP: Inda Coke, PA  REFERRING PROVIDER: Eppie Gibson, MD  REFERRING DIAG: C09.9 (ICD-10-CM) - Tonsil cancer ([place])  THERAPY DIAG:  Abnormal posture - Plan: PT plan of care cert/re-cert  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 Ultrasound and CT neck done that same day revealed 13 mm lymph node in the left lateral neck, possibly suggestive of metastatic disease. CT of the neck also performed on that date showed an enlarged level 2 lymph node measuring 13.7 x 23.3 mm, consistent with ultrasound findings, and again notably suspicious for malignancy given its size and location. 12/15/21 Biopsy done on  enlarged lymph node metastatic moderately differentiated SCC, p 16 +. 12/23/21 Physical exam performed during this visit revealed the palpable 2-3 cm firm left level 2 lymph node, otherwise no other masses, cervical adenopathy or thyroid nodules were appreciated. 12/29/21 PET demonstrated hypermetabolic left cervical adenopathy. No additional evidence suggestive of abnormal hypermetabolism in the neck, chest, abdomen, or pelvis were appreciated. 01/07/22 Biopsy by Dr. Constance Holster revealed SCC to the left tonsil, p 16 +. 02/18/22 Dr. Nicolette Bang performed left radical tonsillectomy  with lymph node dissections, revealing left tonsil invasive SCC measuring 1.5cm (p16 positive) with LVI (negative for PNI). All margins negative for invasive carcinoma by at least 56m. Nodal status of 3/20 dissected lymph nodes (levels 2A, 3, and 4) positive for metastatic carcinoma, and 1/1 level 2B lymph node negative for malignancy.   No ECE. Adjuvant Radiation treatment was recommended.  She will receive 30 fractions of post surgery radiation to her Tonsillar bed and bilateral neck. She started on 03/30/22 and will complete 05/10/22.   PATIENT GOALS   to be educated about the signs and symptoms of lymphedema and learn post op HEP.   PAIN:  Are you having pain? Yes: NPRS scale: 3/10 Pain location: throat Pain description: annoying, irritated Aggravating factors: eating Relieving factors: drinking a lot of liquids like Ensure   PRECAUTIONS: Active CA  WEIGHT BEARING RESTRICTIONS No  FALLS:  Has patient fallen in last 6 months? Yes. Number of falls 1 pt blacked out possibly due to heat exhaustion Does the patient have a fear of falling that limits activity? No Is the patient reluctant to leave the house due to a fear of falling?No  LIVING ENVIRONMENT: Patient lives with: husband and 2 children Lives in: House/apartment Has following equipment at home: None  OCCUPATION: full time job doing CMA for gastro doctor  LEISURE: pt walks 2x/wk for 30-45 min  PRIOR LEVEL OF FUNCTION: Independent   OBJECTIVE  COGNITION:           Overall cognitive status: Within functional limits for tasks assessed                  POSTURE:  Forward head and rounded shoulders posture   30 SEC SIT TO STAND: 18 reps in 30 sec without use of UEs which is  Average for patient's age   SHOULDER AROM:   WFL but pt has nerve pain down L arm - educated pt about nerve flossing and median nerve stretch    CERVICAL AROM:   Percent limited  Flexion WFL  Extension WFL  Right lateral flexion 50% limited   Left lateral flexion 75% limited  Right rotation WFL but tight  Left rotation WFL but tight    (Blank rows=not tested)   GAIT:  Assessed: Yes Assistance needed: Independent  Assistive Device: None Gait pattern: WFL Ambulation surface: Level  PATIENT EDUCATION:  Education details: Neck ROM, importance of posture when sitting, standing and lying down, deep breathing, walking program and importance of staying active throughout treatment, CURE article on staying active, "Why exercise?" flyer, lymphedema and PT info Person educated: Patient Education method: Explanation, Demonstration, Handout Education comprehension: Patient verbalized understanding and returned demonstration   HOME EXERCISE PROGRAM: Patient was instructed today in a home exercise program today for head and neck range of motion exercises. These included active cervical flexion, active cervical extension, active cervical rotation to each direction, upper trap stretch, and shoulder retraction. Patient was encouraged to do these 2-3 times a day, holding for 5 sec  each and completing for 5 reps. Pt was educated that once this becomes easier then hold the stretches for 30-60 seconds.    ASSESSMENT:  CLINICAL IMPRESSION: Pt arrives to PT with recently diagnosed tonsillar cancer. .She will receive 30 fractions of post surgery radiation to her Tonsillar bed and bilateral neck. She started on 03/30/22 and will complete 05/10/22.  Pt's cervical ROM was [facility] [place]. She did have some nerve pain when extending her L arm secondary to surgery so educated pt in a nerve stretch. Educated pt about signs and symptoms of lymphedema as well as anatomy and physiology of lymphatic system. Educated pt in importance of staying as active as possible throughout treatment to decrease fatigue as well as head and neck ROM exercises to decrease loss of ROM. Will see pt after completion of radiation to reassess ROM and assess for lymphedema and to determine therapy  needs at that time.  Pt will benefit from skilled therapeutic intervention to improve on the following deficits: Decreased knowledge of precautions and postural dysfunction. Other deficits: decreased knowledge of condition, postural dysfunction, and pain  PT treatment/interventions: ADL/self-care home management, pt/family education, therapeutic exercise. Other interventions Therapeutic exercises, Patient/Family education, and Self Care  REHAB POTENTIAL: Good  CLINICAL DECISION MAKING: Stable/uncomplicated  EVALUATION COMPLEXITY: Low   GOALS: Goals reviewed with patient? YES  LONG TERM GOALS: (STG=LTG)   Name Target Date  Goal status  1 Patient will be able to verbalize understanding of a home exercise program for cervical range of motion, posture, and walking.   Baseline:  No knowledge 03/31/2022 Achieved at eval  2 Patient will be able to verbalize understanding of proper sitting and standing posture. Baseline:  No knowledge 03/31/2022 Achieved at eval  3 Patient will be able to verbalize understanding of lymphedema risk and availability of treatment for this condition Baseline:  No knowledge 03/31/2022 Achieved at eval  4 Pt will demonstrate a return to full cervical ROM and function post operatively compared to baselines and not demonstrate any signs or symptoms of lymphedema.  Baseline: See objective measurements taken today. 06/09/2022 New     PLAN: PT FREQUENCY/DURATION: EVAL and 1 follow up appointment.   PLAN FOR NEXT SESSION: will reassess 2 weeks after completion of radiation to determine needs.  Patient will follow up at outpatient cancer rehab 2 weeks after completion of radiation.  If the patient requires physical therapy at that time, a specific plan will be dictated and sent to the referring physician for approval. The patient was educated today on appropriate basic range of motion exercises to begin now and continue throughout radiation and educated on the signs and  symptoms of lymphedema. Patient verbalized good understanding.     Physical Therapy Information for During and After Head/Neck Cancer Treatment: Lymphedema is a swelling condition that you may be at risk for in your neck and/or face if you have radiation treatment to the area and/or if you have surgery that includes removing lymph nodes.  There is treatment available for this condition and it is not life-threatening.  Contact your physician or physical therapist with concerns. An excellent resource for those seeking information on lymphedema is the National Lymphedema Network's website.  It can be accessed at [place].org If you notice swelling in your neck or face at any time following surgery (even if it is many years from now), please contact your doctor or physical therapist to discuss this.  Lymphedema can be treated at any time but it is easier for you if  it is treated early on. If you have had surgery to your neck, please check with your surgeon about how soon to start doing neck range of motion exercises.  If you are not having surgery, I encourage you to start doing neck range of motion exercises today and continue these while undergoing treatment, UNLESS you have irritation of your skin or soft tissue that is aggravated by doing them.  These exercises are intended to help you prevent loss of range of motion and/or to gain range of motion in your neck (which can be limited by tightening effects of radiation), and NOT to aggravate these tissues if they develop sensitivities from treatment. Neck range of motion exercises should be done to the point of feeling a GENTLE, TOLERABLE stretch only.  You are encouraged to start a walking or other exercise program tomorrow and continue this as much as you are able through and after treatment.  Please feel free to call me with any questions. Manus Gunning, PT, CLT Physical Therapist and Certified Lymphedema Therapist [facility] [address], [address], [place], [place] [address] [phone] [email or web address]  WALKING  Walking is a great form of exercise to increase your strength, endurance and overall fitness.  A walking program can help you start slowly and gradually build endurance as you go.  Everyone's ability is different, so each person's starting point will be different.  You do not have to follow them exactly.  The are just samples. You should simply find out what's right for you and stick to that program.   In the beginning, you'll start off walking 2-3 times a day for short distances.  As you get stronger, you'll be walking further at just 1-2 times per day.  A. You Can Walk For A Certain Length Of Time Each Day    Walk 5 minutes 3 times per day.  Increase 2 minutes every 2 days (3 times per day).  Work up to 25-30 minutes (1-2 times per day).   Example:   Day 1-2 5 minutes 3 times per day   Day 7-8 12 minutes 2-3 times per day   Day 13-14 25 minutes 1-2 times per day  B. You Can Walk For a Certain Distance Each Day     Distance can be substituted for time.    Example:   3 trips to mailbox (at road)   3 trips to corner of block   3 trips around the block  C. Go to local high school and use the track.    Walk for distance ____ around track  Or time ____ minutes  D. Walk ____ Jog ____ Run ___   Why exercise?  So many benefits! Here are SOME of them: Heart health, including raising your good cholesterol level and reducing heart rate and blood pressure Lung health, including improved lung capacity It burns fats, and most of [place] can stand to be leaner, whether or not we are overweight. It increases the body's natural painkillers and mood elevators, so makes you feel better. Not only makes you feel better, but look better too Improves sleep Takes a bite out of stress May decrease your risk of many types of cancer If you are currently undergoing cancer  treatment, exercise may improve your ability to tolerate treatments including chemotherapy. For everybody, it can improve your energy level. Those with cancer-related fatigue report a 40-50% reduction in this symptom when exercising regularly. If you are a survivor of breast, colon, or  prostate cancer, it may decrease your risk of a recurrence. (This may hold for other cancers too, but so far we have data just for these three types.)  How to exercise: Get your doctor's okay. Pick something you enjoy doing, like walking, Zumba, biking, swimming, or whatever. Start at low intensity and time, then gradually increase.  (See walking program handout.) Set a goal to achieve over time.  The American Cancer Society, American Heart Association, and U.S. Dept. of Health and Human Services recommend 150 minutes of moderate exercise, 75 minutes of vigorous exercise, or a combination of both per week. This should be done in episodes at least 10 minutes long, spread throughout the week.  Need help being motivated? Pick something you enjoy doing, because you'll be more inclined to stick with that activity than something that feels like a chore. Do it with a friend so that you are accountable to each other. Schedule it into your day. Place it on your calendar and keep that appointment just like you do any appointment that you make. Join an exercise group that meets at a specific time.  That way, you have to show up on time, and that makes it harder to procrastinate about doing your workout.  It also keeps you accountable--people begin to expect you to be there. Join a gym where you feel comfortable and not intimidated, at the right cost. Sign up for something that you'll need to be in shape for on a specific date, like a 1K or a 5K to walk or run, a 20 or 30 mile bike ride, a mud run or something like that. If the date is looming, you know you'll need to train to be ready for it.  An added benefit is that many of  these are fundraisers for good causes. If you've already paid for a gym membership, group exercise class or event, you might as well work out, so you haven't wasted your money!    Allyson Sabal [place], PT 03/31/2022, 11:57 AM    PHYSICAL THERAPY DISCHARGE SUMMARY  Visits from Start of Care: 1  Current functional level related to goals / functional outcomes: Pt repots to Dr that she is not having any signs of lymphedema and requests to cancel this appointment   Remaining deficits: Pt did not return to follow up but reports to dr she is doing well   Education / Equipment: HEP, lymphedema   Patient agrees to discharge. Patient goals were not met. Patient is being discharged due to the patient's request.  Manus Gunning, PT 05/30/22 10:21 AM

## 2022-03-31 ENCOUNTER — Ambulatory Visit: Payer: 59 | Attending: Radiation Oncology

## 2022-03-31 ENCOUNTER — Encounter: Payer: Self-pay | Admitting: Physical Therapy

## 2022-03-31 ENCOUNTER — Ambulatory Visit
Admission: RE | Admit: 2022-03-31 | Discharge: 2022-03-31 | Disposition: A | Discharge status: Home / Self Care | Payer: 59 | Source: Ambulatory Visit | Attending: Radiation Oncology | Admitting: Radiation Oncology

## 2022-03-31 ENCOUNTER — Inpatient Hospital Stay: Payer: 59 | Attending: Licensed Clinical Social Worker | Admitting: Licensed Clinical Social Worker

## 2022-03-31 ENCOUNTER — Other Ambulatory Visit: Payer: Self-pay

## 2022-03-31 ENCOUNTER — Ambulatory Visit: Payer: 59 | Attending: Radiation Oncology | Admitting: Physical Therapy

## 2022-03-31 DIAGNOSIS — C770 Secondary and unspecified malignant neoplasm of lymph nodes of head, face and neck: Secondary | ICD-10-CM | POA: Diagnosis not present

## 2022-03-31 DIAGNOSIS — C090 Malignant neoplasm of tonsillar fossa: Secondary | ICD-10-CM | POA: Diagnosis not present

## 2022-03-31 DIAGNOSIS — R1312 Dysphagia, oropharyngeal phase: Secondary | ICD-10-CM | POA: Insufficient documentation

## 2022-03-31 DIAGNOSIS — C099 Malignant neoplasm of tonsil, unspecified: Secondary | ICD-10-CM | POA: Insufficient documentation

## 2022-03-31 DIAGNOSIS — C098 Malignant neoplasm of overlapping sites of tonsil: Secondary | ICD-10-CM | POA: Diagnosis not present

## 2022-03-31 DIAGNOSIS — R293 Abnormal posture: Secondary | ICD-10-CM | POA: Insufficient documentation

## 2022-03-31 DIAGNOSIS — Z510 Encounter for antineoplastic radiation therapy: Secondary | ICD-10-CM | POA: Diagnosis not present

## 2022-03-31 LAB — RAD ONC ARIA SESSION SUMMARY
Course Elapsed Days: 1
Plan Fractions Treated to Date: 2
Plan Prescribed Dose Per Fraction: 2 Gy
Plan Total Fractions Prescribed: 30
Plan Total Prescribed Dose: 60 Gy
Reference Point Dosage Given to Date: 4 Gy
Reference Point Session Dosage Given: 2 Gy
Session Number: 2

## 2022-03-31 NOTE — Progress Notes (Signed)
[place] CSW Progress Note  Holiday representative met with patient during H&N [place] to check on coping. Pt is overall doing well. Pt did become tearful when talking about the difficult recovery immediately post-surgery. She is practicing gratitude for any progress she makes and trying to find positives while being realistic about side effects that may occur.   Pt has strong support from her husband, adult children (2 at home right now), dad, and work. She does not want to miss work until she has no choice as she enjoys her work and is stressed that she will leave them shorter staffed.  CSW provided my contact information and information on support programs available.   Sarkis Rhines E Sotiria Keast, LCSW

## 2022-03-31 NOTE — Therapy (Incomplete)
OUTPATIENT SPEECH LANGUAGE PATHOLOGY ONCOLOGY EVALUATION   Patient Name: Jennifer [name] MRN: [id number] DOB:[birth year], 61 y.o., female Today's Date: 03/31/2022  PCP: Inda Coke, MD  REFERRING PROVIDER: Eppie Gibson, MD    Past Medical History:  Diagnosis Date  . Anemia   . Bowel obstruction ([place])   . Cancer ([place]) 12-16-2021   metastatic squamous cell carcimoma  . Fibroids   . GERD (gastroesophageal reflux disease)   . H/O small bowel obstruction   . Hyperlipidemia    no meds, diet controlled  . Restless legs syndrome (RLS)    Past Surgical History:  Procedure Laterality Date  . ABDOMINAL HYSTERECTOMY    . DIAGNOSTIC LAPAROSCOPY     x several with lysis of adhesions  . EXPLORATORY LAPAROTOMY     had bowel obstruction  . EYE SURGERY Bilateral    Lasik  . LARYNGOSCOPY AND ESOPHAGOSCOPY N/A 01/07/2022   Procedure: DIRECT LARYNGOSCOPY WITH BIOPSY; ESOPHAGOSCOPY; FROZEN SECTIONS;  Surgeon: [name], MD;  Location: [place];  Service: ENT;  Laterality: N/A;  . ROTATOR CUFF REPAIR Right 2017  . SMALL INTESTINE SURGERY  [id number]   [facility] - Dr Adriana Reams   Patient Active Problem List   Diagnosis Date Noted  . 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[place]) 03/14/2022  . Gingival recession, generalized 03/11/2022  . Encounter for preoperative dental examination 03/09/2022  . Teeth missing 03/09/2022  . Incipient enamel caries 03/09/2022  . 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 Nonallopathic lesion of sacral region 07/02/2020  . Pruritus ani 04/21/2020  . Microcytic anemia 04/21/2020  . 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 Pain of left hand 02/07/2019  . Small bowel obstruction ([place]) 05/21/2018  . 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 GERD  (gastroesophageal reflux disease) 03/04/2017  . Endometriosis 09/12/1985    ONSET DATE: March 2023   REFERRING DIAG: C09.9 (ICD-10-CM) - Tonsil cancer  THERAPY DIAG:  Dysphagia, oropharyngeal phase  Rationale for Evaluation and Treatment Rehabilitation  SUBJECTIVE:   SUBJECTIVE STATEMENT: "This swallowing is driving me nuts." Pt accompanied by: self  PERTINENT HISTORY: She presented to her PCP on 12/09/21 with a swollen left neck lymph node for 2 months. Ultrasound and CT neck done that same day revealed 13 mm lymph node in the left lateral neck, possibly suggestive of metastatic disease. CT of the neck also performed on that date showed an enlarged level 2 lymph node measuring 13.7 x 23.3 mm, consistent with ultrasound findings, and again notably suspicious for malignancy given its size and location. 12/15/21 Biopsy done on enlarged lymph node metastatic moderately differentiated SCC, p 16 +. 12/23/21 Consult with Dr. Constance Holster.  Physical exam performed during this visit revealed the palpable 2-3 cm firm left level 2 lymph node, otherwise no other masses, cervical adenopathy or thyroid nodules were appreciated. Following evaluation, Dr. Constance Holster recommended further work up via PET imaging before considering surgical options. 12/29/21 PET demonstrated hypermetabolic left cervical adenopathy. No additional evidence suggestive of abnormal hypermetabolism in the neck, chest, abdomen, or pelvis were appreciated.  I personally reviewed her images with her today.  To my eye her left tonsil appears slightly full on imaging. Further biopsies recommended to determine primary. 01/07/22 Biopsy by Dr. Constance Holster revealed SCC to the left tonsil, p 16+. 02/18/22 Dr. Nicolette Bang performed left radical  tonsillectomy with lymph node dissections, revealing left tonsil invasive SCC measuring 1.5cm (p16 positive) with LVI (negative for PNI). All margins negative for invasive carcinoma by at least 34m. Nodal status of 3/20 dissected lymph  nodes (levels 2A, 3, and 4) positive for metastatic carcinoma, and 1/1 level 2B lymph node negative for malignancy.   No ECE. Adjuvant Radiation treatment was recommended. Treatment plan:  She will receive 30 fractions of post surgery radiation to her Tonsilar bed and bilateral neck. She started on 03/30/22 and will complete 05/10/22.  PAIN:  Are you having pain? No   FALLS: Has patient fallen in last 6 months?  No   LIVING ENVIRONMENT: Lives with: lives with their spouse Lives in: House/apartment   PLOF:  Level of assistance: Independent with ADLs Employment: Retired     PATIENT GOALS Maintain WNL swallow function   OBJECTIVE:    DIAGNOSTIC FINDINGS: See above in "pertinent history"   RECOMMENDATIONS FROM OBJECTIVE SWALLOW STUDY (MBSS/FEES):  N/A   COGNITION: Overall cognitive status: Within functional limits for tasks assessed    LANGUAGE: Receptive and Expressive language appeared WNL.   ORAL MOTOR ASSESSMENT:   WFL   MOTOR SPEECH: WNL   CLINICAL SWALLOW ASSESSMENT:   Current diet: Dys I-II and thin liquids Dentition: adequate natural dentition Patient directly observed with POs: Yes: applesauce (dys I) and thin liquids  Feeding: able to feed self Liquids provided by: cup Oral phase signs and symptoms:  None Pharyngeal phase signs and symptoms:  Reported decr'd pharyngeal clearance of boluses/residue   TODAY'S TREATMENT:  SLP trialed pt with liquid wash after applesauce which pt stated was more effective than without liquid wash, which she largely had not used prior to today's session. SLP encouraged her to use liquid wash, alternating bite/sip, during her PO intake. Secondly, SLP recommended pt use gravies, sauces, and condiments to enhance pharyngeal clearance with other POs, as she reported mashed potatoes were difficult to clear without any gravy. Other solid PO suggestions were made for pt that may enhance pharyngeal clearance of boluses.  Research states the  risk for dysphagia increases due to radiation and/or chemotherapy treatment due to a variety of factors, so SLP educated the pt about the possibility of reduced/limited ability for PO intake during rad tx. SLP also educated pt regarding possible/likely changes to swallowing musculature after rad tx, and why adherence to dysphagia HEP provided today and PO consumption was necessary to inhibit muscle fibrosis following rad tx and to mitigate muscle disuse atrophy. SLP informed pt why this would be detrimental to their swallowing status and to their pulmonary health. Pt demonstrated understanding of these things to SLP. SLP encouraged pt to safely eat and drink as deep into her radiation therapy as possible to provide the best possible long-term swallowing outcome for pt.    SLP then developed an individualized HEP for pt involving oral and pharyngeal strengthening and ROM and pt was instructed how to perform these exercises, including SLP demonstration. After SLP demonstration, pt return demonstrated each exercise. SLP ensured pt performance was correct prior to educating pt on next exercise. Pt required usual min-mod cues faded to modified independent to perform HEP. [name] was instructed to complete this program 6-7 days/week, at least 2 times a day until 6 months after her last day of rad tx, and then x2 a week after that, indefinitely. Among other modifications for days when pt cannot functionally swallow, SLP also suggested pt perform Shaker, and if necessary to cycle through the swallowing  portion so the full program of exercises can be completed instead of fatiguing on one of the swallowing exercises and being unable to perform the other swallowing exercises. SLP instructed that reps of swallowing exercises should then be added back into the regimen as pt is able to do so.    PATIENT EDUCATION: Education details: late effects head/neck radiation on swallow function, HEP procedure, and modification to HEP when  difficulty experienced with swallowing during and after radiation course Person educated: Patient Education method: Consulting civil engineer, Demonstration, Verbal cues, and Handouts Education comprehension: verbalized understanding, returned demonstration, verbal cues required, and needs further education     ASSESSMENT:   CLINICAL IMPRESSION: Patient is a 61 y.o. female who was seen today for assessment of swallowing as she undergoes radiation therapy. Today pt ate ate applesauce and drank thin liquids with report of mild pharyngeal residue which mostly cleared with liquid wash. At this time pt swallowing is deemed [facility] with these type of POs. No overt s/sx  aspiration were observed. There are no overt s/s aspiration PNA observed by SLP nor any reported by pt at this time. Data indicate that pt's swallow ability will likely decrease over the course of radiation/chemoradiation therapy and could very well decline over time following the conclusion of that therapy due to muscle disuse atrophy and/or muscle fibrosis. Pt will cont to need to be seen by SLP in order to assess safety of PO intake, assess the need for recommending any objective swallow assessment, and ensuring pt is correctly completing the individualized HEP.   OBJECTIVE IMPAIRMENTS include dysphagia. These impairments are limiting patient from safety when swallowing. Factors affecting potential to achieve goals and functional outcome are  none . Patient will benefit from skilled SLP services to address above impairments and improve overall function.   REHAB POTENTIAL: Excellent     GOALS: Goals reviewed with patient? No   SHORT TERM GOALS: Target completion:  first 3 therapy sessions (overall visit #4)       pt will complete HEP with rare min A  Baseline: Goal status: INITIAL   2.  pt will tell SLP why pt is completing HEP with modified independence Baseline:  Goal status: INITIAL   3.  pt will describe 3 overt s/s aspiration PNA with  modified independence Baseline:  Goal status: INITIAL   4.  pt will tell SLP how a food journal could hasten return to a more normalized diet Baseline:  Goal status: INITIAL     LONG TERM GOALS: Target: sixth ST session (overall visit #7)   pt will complete HEP with modified independence over two visits Baseline:  Goal status: INITIAL   2.  pt will describe how to modify HEP over time, and the timeline associated with reduction in HEP frequency with modified independence over two sessions Baseline:  Goal status: INITIAL     PLAN: SLP FREQUENCY:  once every approx 4 weeks   SLP DURATION:  7 sessions   PLANNED INTERVENTIONS: Aspiration precaution training, Pharyngeal strengthening exercises, Diet toleration management , Trials of upgraded texture/liquids, SLP instruction and feedback, Compensatory strategies, and Patient/family education   [facility], [place] 03/31/2022, 10:54 AM

## 2022-03-31 NOTE — Progress Notes (Signed)
Oncology Nurse Navigator Documentation   I met with Ms. Jennifer [name] before and after her SLP an PT appointments today. She tolerated her first radiation treatment yesterday except some jaw/teeth pain which I advised her to mention to the radiation therapists. I answered further questions as well and she knows to contact me for anything that she needs.   [name] RN, BSN, OCN Head & Neck Oncology Nurse [place] at [facility] Phone # [phone]  Fax # [phone]

## 2022-03-31 NOTE — Therapy (Signed)
OUTPATIENT SPEECH LANGUAGE PATHOLOGY ONCOLOGY EVALUATION   Patient Name: Jennifer [name] MRN: [id number] DOB:[birth year], 61 y.o., female Today's Date: 04/01/2022  PCP: Inda Coke, MD  REFERRING PROVIDER: Eppie Gibson, MD   End of Session - 04/01/22 0003     Visit Number 1    Number of Visits 7    Date for SLP Re-Evaluation 06/30/22    SLP Start Time 101    SLP Stop Time  1120    SLP Time Calculation (min) 50 min    Activity Tolerance Patient tolerated treatment well             Past Medical History:  Diagnosis Date   Anemia    Bowel obstruction ([place])    Cancer ([place]) 12-16-2021   metastatic squamous cell carcimoma   Fibroids    GERD (gastroesophageal reflux disease)    H/O small bowel obstruction    Hyperlipidemia    no meds, diet controlled   Restless legs syndrome (RLS)    Past Surgical History:  Procedure Laterality Date   ABDOMINAL HYSTERECTOMY     DIAGNOSTIC LAPAROSCOPY     x several with lysis of adhesions   EXPLORATORY LAPAROTOMY     had bowel obstruction   EYE SURGERY Bilateral    Lasik   LARYNGOSCOPY AND ESOPHAGOSCOPY N/A 01/07/2022   Procedure: DIRECT LARYNGOSCOPY WITH BIOPSY; ESOPHAGOSCOPY; FROZEN SECTIONS;  Surgeon: [name], MD;  Location: [place];  Service: ENT;  Laterality: N/A;   ROTATOR CUFF REPAIR Right 2017   SMALL INTESTINE SURGERY  [id number]   [facility] - Dr Adriana Reams   Patient Active Problem List   Diagnosis Date Noted   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[place]) 03/14/2022   Gingival recession, generalized 03/11/2022   Encounter for preoperative dental examination 03/09/2022   Teeth missing 03/09/2022   Incipient enamel caries 03/09/2022   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) 05/21/2018   Popliteus tendinitis of right lower extremity 08/01/2017   GERD (gastroesophageal reflux disease) 03/04/2017   Endometriosis 09/12/1985    ONSET DATE: March 2023   REFERRING DIAG: C09.9 (ICD-10-CM) - Tonsil cancer  THERAPY DIAG:  Dysphagia, oropharyngeal phase  Rationale for Evaluation and Treatment Rehabilitation  SUBJECTIVE:   SUBJECTIVE STATEMENT: "This swallowing is driving me nuts." Pt accompanied by: self  PERTINENT HISTORY: She presented to her PCP on 12/09/21 with a swollen left neck lymph node for 2 months. Ultrasound and CT neck done that same day revealed 13 mm lymph node in the left lateral neck, possibly suggestive of metastatic disease. CT of the neck also performed on that date showed an enlarged level 2 lymph node measuring 13.7 x 23.3 mm, consistent with ultrasound findings, and again notably suspicious for malignancy given its size and location. 12/15/21 Biopsy done on enlarged lymph node metastatic moderately differentiated SCC, p 16 +. 12/23/21 Consult with Dr. Constance Holster.  Physical exam performed during this visit revealed the palpable 2-3 cm firm left level 2 lymph node, otherwise no other masses, cervical adenopathy or thyroid nodules were appreciated. Following evaluation, Dr. Constance Holster recommended further work up via PET imaging before  considering surgical options. 12/29/21 PET demonstrated hypermetabolic left cervical adenopathy. No additional evidence suggestive of abnormal hypermetabolism in the neck, chest, abdomen, or pelvis were appreciated.  I personally reviewed her images with her today.  To my eye her left tonsil appears slightly full on imaging. Further biopsies recommended to determine primary. 01/07/22 Biopsy by Dr. Constance Holster revealed SCC to the left tonsil, p 16+. 02/18/22 Dr. Nicolette Bang performed left  radical tonsillectomy with lymph node dissections, revealing left tonsil invasive SCC measuring 1.5cm (p16 positive) with LVI (negative for PNI). All margins negative for invasive carcinoma by at least 34m. Nodal status of 3/20 dissected lymph nodes (levels 2A, 3, and 4) positive for metastatic carcinoma, and 1/1 level 2B lymph node negative for malignancy.   No ECE. Adjuvant Radiation treatment was recommended. Treatment plan:  She will receive 30 fractions of post surgery radiation to her Tonsilar bed and bilateral neck. She started on 03/30/22 and will complete 05/10/22.  PAIN:  Are you having pain? No   FALLS: Has patient fallen in last 6 months?  No   LIVING ENVIRONMENT: Lives with: lives with their spouse Lives in: House/apartment   PLOF:  Level of assistance: Independent with ADLs Employment: Retired     PATIENT GOALS Maintain WNL swallow function   OBJECTIVE:    DIAGNOSTIC FINDINGS: See above in "pertinent history"   RECOMMENDATIONS FROM OBJECTIVE SWALLOW STUDY (MBSS/FEES):  N/A   COGNITION: Overall cognitive status: Within functional limits for tasks assessed    LANGUAGE: Receptive and Expressive language appeared WNL.   ORAL MOTOR ASSESSMENT:   WFL   MOTOR SPEECH: WNL   CLINICAL SWALLOW ASSESSMENT:   Current diet: Dys I-II and thin liquids Dentition: adequate natural dentition Patient directly observed with POs: Yes: applesauce (dys I) and thin liquids  Feeding: able to feed self Liquids provided by: cup Oral phase signs and symptoms:  None Pharyngeal phase signs and symptoms:  Reported decr'd pharyngeal clearance of boluses/residue   TODAY'S TREATMENT:  SLP trialed pt with liquid wash after applesauce which pt stated was more effective than without liquid wash, which she largely had not used prior to today's session. SLP encouraged her to use liquid wash, alternating bite/sip, during her PO intake. Secondly, SLP recommended pt use gravies, sauces, and  condiments to enhance pharyngeal clearance with other POs, as she reported mashed potatoes were difficult to clear without any gravy. Other solid PO suggestions were made for pt that may enhance pharyngeal clearance of boluses.  Research states the risk for dysphagia increases due to radiation and/or chemotherapy treatment due to a variety of factors, so SLP educated the pt about the possibility of reduced/limited ability for PO intake during rad tx. SLP also educated pt regarding possible/likely changes to swallowing musculature after rad tx, and why adherence to dysphagia HEP provided today and PO consumption was necessary to inhibit muscle fibrosis following rad tx and to mitigate muscle disuse atrophy. SLP informed pt why this would be detrimental to their swallowing status and to their pulmonary health. Pt demonstrated understanding of these things to SLP. SLP encouraged pt to safely eat and drink as deep into her radiation therapy as possible to provide the best possible long-term swallowing outcome for pt.    SLP then developed an individualized HEP for pt involving oral and pharyngeal strengthening and ROM and pt was instructed how to perform these exercises, including SLP demonstration. After SLP demonstration, pt return demonstrated each exercise. SLP ensured pt performance was correct prior to educating pt  on next exercise. Pt required usual min-mod cues faded to modified independent to perform HEP. [name] was instructed to complete this program 6-7 days/week, at least 2 times a day until 6 months after her last day of rad tx, and then x2 a week after that, indefinitely. Among other modifications for days when pt cannot functionally swallow, SLP also suggested pt perform Shaker, and if necessary to cycle through the swallowing portion so the full program of exercises can be completed instead of fatiguing on one of the swallowing exercises and being unable to perform the other swallowing exercises. SLP  instructed that reps of swallowing exercises should then be added back into the regimen as pt is able to do so.    PATIENT EDUCATION: Education details: late effects head/neck radiation on swallow function, HEP procedure, and modification to HEP when difficulty experienced with swallowing during and after radiation course Person educated: Patient Education method: Consulting civil engineer, Demonstration, Verbal cues, and Handouts Education comprehension: verbalized understanding, returned demonstration, verbal cues required, and needs further education     ASSESSMENT:   CLINICAL IMPRESSION: Patient is a 61 y.o. female who was seen today for assessment of swallowing as she undergoes radiation therapy. Today pt ate ate applesauce and drank thin liquids with report of mild pharyngeal residue which mostly cleared with liquid wash. At this time pt swallowing is deemed [facility] with these type of POs. No overt s/sx  aspiration were observed. There are no overt s/s aspiration PNA observed by SLP nor any reported by pt at this time. Data indicate that pt's swallow ability will likely decrease over the course of radiation/chemoradiation therapy and could very well decline over time following the conclusion of that therapy due to muscle disuse atrophy and/or muscle fibrosis. Pt will cont to need to be seen by SLP in order to assess safety of PO intake, assess the need for recommending any objective swallow assessment, and ensuring pt is correctly completing the individualized HEP.   OBJECTIVE IMPAIRMENTS include dysphagia. These impairments are limiting patient from safety when swallowing. Factors affecting potential to achieve goals and functional outcome are  none . Patient will benefit from skilled SLP services to address above impairments and improve overall function.   REHAB POTENTIAL: Excellent     GOALS: Goals reviewed with patient? No   SHORT TERM GOALS: Target completion:  first 3 therapy sessions (overall visit  #4)       pt will complete HEP with rare min A  Baseline: Goal status: INITIAL   2.  pt will tell SLP why pt is completing HEP with modified independence Baseline:  Goal status: INITIAL   3.  pt will describe 3 overt s/s aspiration PNA with modified independence Baseline:  Goal status: INITIAL   4.  pt will tell SLP how a food journal could hasten return to a more normalized diet Baseline:  Goal status: INITIAL     LONG TERM GOALS: Target: sixth ST session (overall visit #7)   pt will complete HEP with modified independence over two visits Baseline:  Goal status: INITIAL   2.  pt will describe how to modify HEP over time, and the timeline associated with reduction in HEP frequency with modified independence over two sessions Baseline:  Goal status: INITIAL     PLAN: SLP FREQUENCY:  once every approx 4 weeks   SLP DURATION:  7 sessions   PLANNED INTERVENTIONS: Aspiration precaution training, Pharyngeal strengthening exercises, Diet toleration management , Trials of upgraded texture/liquids, SLP instruction and  feedback, Compensatory strategies, and Patient/family education   [facility], [place] 04/01/2022, 12:05 AM

## 2022-04-01 ENCOUNTER — Inpatient Hospital Stay: Payer: 59 | Admitting: Dietician

## 2022-04-01 ENCOUNTER — Other Ambulatory Visit: Payer: Self-pay

## 2022-04-01 ENCOUNTER — Ambulatory Visit
Admission: RE | Admit: 2022-04-01 | Discharge: 2022-04-01 | Disposition: A | Discharge status: Home / Self Care | Payer: 59 | Source: Ambulatory Visit | Attending: Radiation Oncology | Admitting: Radiation Oncology

## 2022-04-01 DIAGNOSIS — C090 Malignant neoplasm of tonsillar fossa: Secondary | ICD-10-CM | POA: Diagnosis not present

## 2022-04-01 DIAGNOSIS — C770 Secondary and unspecified malignant neoplasm of lymph nodes of head, face and neck: Secondary | ICD-10-CM | POA: Diagnosis not present

## 2022-04-01 DIAGNOSIS — C098 Malignant neoplasm of overlapping sites of tonsil: Secondary | ICD-10-CM | POA: Diagnosis not present

## 2022-04-01 DIAGNOSIS — Z510 Encounter for antineoplastic radiation therapy: Secondary | ICD-10-CM | POA: Diagnosis not present

## 2022-04-01 LAB — RAD ONC ARIA SESSION SUMMARY
Course Elapsed Days: 2
Plan Fractions Treated to Date: 3
Plan Prescribed Dose Per Fraction: 2 Gy
Plan Total Fractions Prescribed: 30
Plan Total Prescribed Dose: 60 Gy
Reference Point Dosage Given to Date: 6 Gy
Reference Point Session Dosage Given: 2 Gy
Session Number: 3

## 2022-04-01 NOTE — Progress Notes (Signed)
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 S/p radical left tonsillectomy 6/9. Patient is planning to receive 30 fractions of adjuvant radiation to tonsillar bed and bilateral neck (started 7/19). Patient is under the care of Dr. Isidore Moos.   Past medical history includes anemia, SBO, RLS, fibroids, GERD, HLD  Met with patient in office following treatment. She reports tolerating first days of radiation well. Patient reports minimal weight loss (~7 lbs) s/p surgery. She has been maintaining her weights on puree/soft diet. Patient reports pushing herself to eat even when not hungry and feels this routine will benefit her throughout treatment. Patient drinks a smoothie in the mornings (fruit,protein powder, milk, yogurt, spinach) and eats bowl of ice cream before bed. Patient eating a variety of foods with good sources of protein including tuna salad, cottage cheese, mac/cheese, yogurt, baked spaghetti, and lot of pureed soups.    Nutrition Focused Physical Exam: deferred   Medications: celebrex, D3, estrace, pepcid, ferrous sulfate, zofran, requip, ambien   Labs: reviewed    Anthropometrics:   Height: 5'4" Weight: 123 lb 4 oz  UBW: 135 lb (03/30) BMI: 21.16   Estimated Energy Needs  Kcals: 1845-2150 Protein: 92-105 Fluid: 2.1 L   NUTRITION DIAGNOSIS: Predicted suboptimal intake related to cancer and associated side effects of treatment as evidenced by radiation therapy to bilateral neck affecting ability swallow   INTERVENTION:  Educated on importance of adequate calories and protein to maintain weights/strength/healing Educated on common side effects of therapy including dry mouth/thick saliva, sore throat, altered taste. Encouraged baking soda salt water rinses several times daily - pt has recipe Discussed soft moist high protein foods and easy to swallow foods - handout with ideas provided  Continue  eating small frequent meals + bedtime snack Discussed ways to add calories/protein to foods (fairlife milk, coconut milk, peanut butter, using supplement in coffee or added to ice cream)  Continue drinking Boost/Ensure, recommend 2-3/day  Provided sample of CIB powder as well as Boost VHC for pt to try Will work with pt to avoid placement of feeding tube as able. Pt is open to continued discussion of PEG placement as needed   MONITORING, EVALUATION, GOAL: Patient will tolerate increased calories and protein to minimize weight loss   Next Visit: Friday July 28 after radiation

## 2022-04-04 ENCOUNTER — Other Ambulatory Visit: Payer: Self-pay | Admitting: Radiation Oncology

## 2022-04-04 ENCOUNTER — Ambulatory Visit
Admission: RE | Admit: 2022-04-04 | Discharge: 2022-04-04 | Disposition: A | Discharge status: Home / Self Care | Payer: 59 | Source: Ambulatory Visit | Attending: Radiation Oncology | Admitting: Radiation Oncology

## 2022-04-04 ENCOUNTER — Other Ambulatory Visit: Payer: Self-pay

## 2022-04-04 DIAGNOSIS — C098 Malignant neoplasm of overlapping sites of tonsil: Secondary | ICD-10-CM | POA: Diagnosis not present

## 2022-04-04 DIAGNOSIS — C090 Malignant neoplasm of tonsillar fossa: Secondary | ICD-10-CM | POA: Diagnosis not present

## 2022-04-04 DIAGNOSIS — C770 Secondary and unspecified malignant neoplasm of lymph nodes of head, face and neck: Secondary | ICD-10-CM | POA: Diagnosis not present

## 2022-04-04 DIAGNOSIS — Z510 Encounter for antineoplastic radiation therapy: Secondary | ICD-10-CM | POA: Diagnosis not present

## 2022-04-04 LAB — RAD ONC ARIA SESSION SUMMARY
Course Elapsed Days: 5
Plan Fractions Treated to Date: 4
Plan Prescribed Dose Per Fraction: 2 Gy
Plan Total Fractions Prescribed: 30
Plan Total Prescribed Dose: 60 Gy
Reference Point Dosage Given to Date: 8 Gy
Reference Point Session Dosage Given: 2 Gy
Session Number: 4

## 2022-04-04 MED ORDER — SONAFINE EX EMUL
1.0000 | Freq: Two times a day (BID) | CUTANEOUS | Status: DC
  Administered 2022-04-04: 1 via TOPICAL

## 2022-04-04 MED ORDER — LIDOCAINE VISCOUS HCL 2 % MT SOLN: OROMUCOSAL | 3 refills | Status: DC

## 2022-04-04 NOTE — Progress Notes (Signed)
Pt here for patient teaching. Pt given Radiation and You booklet, Managing Acute Radiation Side Effects for Head and Neck Cancer handout, skin care instructions, and Sonafine.  Reviewed areas of pertinence such as fatigue, hair loss, mouth changes, nausea and vomiting, skin changes, throat changes, headache, earaches, and taste changes. Pt able to give teach back of to pat skin, use unscented/gentle soap, and drink plenty of water, apply Sonafine bid, avoid applying anything to skin within 4 hours of treatment, and to use an electric razor if they must shave. Pt verbalizes understanding of information given and will contact nursing with any questions or concerns.     [email or web address]

## 2022-04-05 ENCOUNTER — Ambulatory Visit
Admission: RE | Admit: 2022-04-05 | Discharge: 2022-04-05 | Disposition: A | Discharge status: Home / Self Care | Payer: 59 | Source: Ambulatory Visit | Attending: Radiation Oncology | Admitting: Radiation Oncology

## 2022-04-05 ENCOUNTER — Other Ambulatory Visit: Payer: Self-pay

## 2022-04-05 DIAGNOSIS — C770 Secondary and unspecified malignant neoplasm of lymph nodes of head, face and neck: Secondary | ICD-10-CM | POA: Diagnosis not present

## 2022-04-05 DIAGNOSIS — C090 Malignant neoplasm of tonsillar fossa: Secondary | ICD-10-CM | POA: Diagnosis not present

## 2022-04-05 DIAGNOSIS — Z510 Encounter for antineoplastic radiation therapy: Secondary | ICD-10-CM | POA: Diagnosis not present

## 2022-04-05 DIAGNOSIS — C098 Malignant neoplasm of overlapping sites of tonsil: Secondary | ICD-10-CM | POA: Diagnosis not present

## 2022-04-05 LAB — RAD ONC ARIA SESSION SUMMARY
Course Elapsed Days: 6
Plan Fractions Treated to Date: 5
Plan Prescribed Dose Per Fraction: 2 Gy
Plan Total Fractions Prescribed: 30
Plan Total Prescribed Dose: 60 Gy
Reference Point Dosage Given to Date: 10 Gy
Reference Point Session Dosage Given: 2 Gy
Session Number: 5

## 2022-04-06 ENCOUNTER — Other Ambulatory Visit: Payer: Self-pay

## 2022-04-06 ENCOUNTER — Ambulatory Visit
Admission: RE | Admit: 2022-04-06 | Discharge: 2022-04-06 | Disposition: A | Discharge status: Home / Self Care | Payer: 59 | Source: Ambulatory Visit | Attending: Radiation Oncology | Admitting: Radiation Oncology

## 2022-04-06 DIAGNOSIS — C090 Malignant neoplasm of tonsillar fossa: Secondary | ICD-10-CM | POA: Diagnosis not present

## 2022-04-06 DIAGNOSIS — Z510 Encounter for antineoplastic radiation therapy: Secondary | ICD-10-CM | POA: Diagnosis not present

## 2022-04-06 DIAGNOSIS — C098 Malignant neoplasm of overlapping sites of tonsil: Secondary | ICD-10-CM | POA: Diagnosis not present

## 2022-04-06 DIAGNOSIS — C770 Secondary and unspecified malignant neoplasm of lymph nodes of head, face and neck: Secondary | ICD-10-CM | POA: Diagnosis not present

## 2022-04-06 LAB — RAD ONC ARIA SESSION SUMMARY
Course Elapsed Days: 7
Plan Fractions Treated to Date: 6
Plan Prescribed Dose Per Fraction: 2 Gy
Plan Total Fractions Prescribed: 30
Plan Total Prescribed Dose: 60 Gy
Reference Point Dosage Given to Date: 12 Gy
Reference Point Session Dosage Given: 2 Gy
Session Number: 6

## 2022-04-07 ENCOUNTER — Other Ambulatory Visit: Payer: Self-pay

## 2022-04-07 ENCOUNTER — Ambulatory Visit
Admission: RE | Admit: 2022-04-07 | Discharge: 2022-04-07 | Disposition: A | Discharge status: Home / Self Care | Payer: 59 | Source: Ambulatory Visit | Attending: Radiation Oncology | Admitting: Radiation Oncology

## 2022-04-07 DIAGNOSIS — C098 Malignant neoplasm of overlapping sites of tonsil: Secondary | ICD-10-CM | POA: Diagnosis not present

## 2022-04-07 DIAGNOSIS — Z510 Encounter for antineoplastic radiation therapy: Secondary | ICD-10-CM | POA: Diagnosis not present

## 2022-04-07 DIAGNOSIS — C090 Malignant neoplasm of tonsillar fossa: Secondary | ICD-10-CM | POA: Diagnosis not present

## 2022-04-07 DIAGNOSIS — C770 Secondary and unspecified malignant neoplasm of lymph nodes of head, face and neck: Secondary | ICD-10-CM | POA: Diagnosis not present

## 2022-04-07 LAB — RAD ONC ARIA SESSION SUMMARY
Course Elapsed Days: 8
Plan Fractions Treated to Date: 7
Plan Prescribed Dose Per Fraction: 2 Gy
Plan Total Fractions Prescribed: 30
Plan Total Prescribed Dose: 60 Gy
Reference Point Dosage Given to Date: 14 Gy
Reference Point Session Dosage Given: 2 Gy
Session Number: 7

## 2022-04-08 ENCOUNTER — Ambulatory Visit: Payer: 59

## 2022-04-08 ENCOUNTER — Ambulatory Visit
Admission: RE | Admit: 2022-04-08 | Discharge: 2022-04-08 | Disposition: A | Discharge status: Home / Self Care | Payer: 59 | Source: Ambulatory Visit | Attending: Radiation Oncology | Admitting: Radiation Oncology

## 2022-04-08 ENCOUNTER — Other Ambulatory Visit: Payer: Self-pay

## 2022-04-08 ENCOUNTER — Inpatient Hospital Stay: Payer: 59 | Admitting: Dietician

## 2022-04-08 DIAGNOSIS — C099 Malignant neoplasm of tonsil, unspecified: Secondary | ICD-10-CM

## 2022-04-08 DIAGNOSIS — Z510 Encounter for antineoplastic radiation therapy: Secondary | ICD-10-CM | POA: Diagnosis not present

## 2022-04-08 DIAGNOSIS — C090 Malignant neoplasm of tonsillar fossa: Secondary | ICD-10-CM | POA: Diagnosis not present

## 2022-04-08 DIAGNOSIS — C770 Secondary and unspecified malignant neoplasm of lymph nodes of head, face and neck: Secondary | ICD-10-CM | POA: Diagnosis not present

## 2022-04-08 DIAGNOSIS — C098 Malignant neoplasm of overlapping sites of tonsil: Secondary | ICD-10-CM | POA: Diagnosis not present

## 2022-04-08 LAB — RAD ONC ARIA SESSION SUMMARY
Course Elapsed Days: 9
Plan Fractions Treated to Date: 8
Plan Prescribed Dose Per Fraction: 2 Gy
Plan Total Fractions Prescribed: 30
Plan Total Prescribed Dose: 60 Gy
Reference Point Dosage Given to Date: 16 Gy
Reference Point Session Dosage Given: 2 Gy
Session Number: 8

## 2022-04-08 NOTE — Progress Notes (Signed)
Nutrition Follow-up:   Patient with SCC of left tonsil s/p radical left tonsillectomy. She is currently receiving adjuvant radiation therapy (started 7/19).  Met with patient following treatment. She reports feeling a "ledge" along the top of the back of her throat. This at times makes her feel nauseous. She is taking zofran as needed. Patient endorses swallowing difficulty. She did not eat yesterday, reports losing 2 pounds. Patient reports things have improved some today. She has eaten soup which she tolerated well. Patient is drinking Ensure High Protein (160 kcal, 16 g protein). She tried Boost VHC sample. Patient did not like this, unable to finish drinking it. Patient is drinking ~96 ounces of water. She has noticed her mouth is starting to become dry.    Medications: reviewed   Labs: no new labs  Anthropometrics: Last aria weight 123.8 lb on 7/24 stable  7/3 - 123 lb 4 oz  Estimated Energy Needs  Kcals: 1845-2150 Protein: 92-105 Fluid: 2.1 L  NUTRITION DIAGNOSIS: Predicted suboptimal intake continues   INTERVENTION:  Provided support and encouragement. Will continue efforts to minimize need for feeding tube Continue drinking Ensure, suggested switching to Ensure Plus/Ensure Complete 3x/day - samples of Ensure Complete + Ensure Plus provided (if pt can drink Ensure Plus, will provide complimentary case) Patient liked fairlife milk, encouraged mixing with CIB powder Samples of [place] protein powder given for pt to try Recommend baking soda salt water rinses several times daily    MONITORING, EVALUATION, GOAL: weight trends, intake   NEXT VISIT: Friday August 4 after radiation

## 2022-04-08 NOTE — Progress Notes (Signed)
Oncology Nurse Navigator Documentation   Dr. Isidore Moos saw Jennifer [name] today due to increased dysphagia while undergoing radiation treatments for her head and neck cancer. Dr. Isidore Moos has placed an order for a MBSS and I have notified [name] SLP that Dr. Isidore Moos would like him to see her after the study is completed.  [name] RN, BSN, OCN Head & Neck Oncology Nurse [place] [facility] Phone # [phone]  Fax # [phone]

## 2022-04-11 ENCOUNTER — Other Ambulatory Visit: Payer: Self-pay | Admitting: Radiation Oncology

## 2022-04-11 ENCOUNTER — Ambulatory Visit
Admission: RE | Admit: 2022-04-11 | Discharge: 2022-04-11 | Disposition: A | Discharge status: Home / Self Care | Payer: 59 | Source: Ambulatory Visit | Attending: Radiation Oncology | Admitting: Radiation Oncology

## 2022-04-11 ENCOUNTER — Other Ambulatory Visit: Payer: Self-pay

## 2022-04-11 ENCOUNTER — Other Ambulatory Visit (HOSPITAL_COMMUNITY): Payer: Self-pay

## 2022-04-11 DIAGNOSIS — Z510 Encounter for antineoplastic radiation therapy: Secondary | ICD-10-CM | POA: Diagnosis not present

## 2022-04-11 DIAGNOSIS — C090 Malignant neoplasm of tonsillar fossa: Secondary | ICD-10-CM

## 2022-04-11 DIAGNOSIS — C770 Secondary and unspecified malignant neoplasm of lymph nodes of head, face and neck: Secondary | ICD-10-CM | POA: Diagnosis not present

## 2022-04-11 DIAGNOSIS — C098 Malignant neoplasm of overlapping sites of tonsil: Secondary | ICD-10-CM | POA: Diagnosis not present

## 2022-04-11 LAB — RAD ONC ARIA SESSION SUMMARY
Course Elapsed Days: 12
Plan Fractions Treated to Date: 9
Plan Prescribed Dose Per Fraction: 2 Gy
Plan Total Fractions Prescribed: 30
Plan Total Prescribed Dose: 60 Gy
Reference Point Dosage Given to Date: 18 Gy
Reference Point Session Dosage Given: 2 Gy
Session Number: 9

## 2022-04-11 MED ORDER — LORAZEPAM 0.5 MG PO TABS
ORAL_TABLET | ORAL | 0 refills | Status: DC
  Filled 2022-04-11: qty 30, 10d supply, fill #0

## 2022-04-12 ENCOUNTER — Ambulatory Visit
Admission: RE | Admit: 2022-04-12 | Discharge: 2022-04-12 | Disposition: A | Discharge status: Home / Self Care | Payer: 59 | Source: Ambulatory Visit | Attending: Radiation Oncology | Admitting: Radiation Oncology

## 2022-04-12 ENCOUNTER — Other Ambulatory Visit: Payer: Self-pay

## 2022-04-12 DIAGNOSIS — C801 Malignant (primary) neoplasm, unspecified: Secondary | ICD-10-CM | POA: Diagnosis not present

## 2022-04-12 DIAGNOSIS — Z510 Encounter for antineoplastic radiation therapy: Secondary | ICD-10-CM | POA: Diagnosis not present

## 2022-04-12 DIAGNOSIS — C770 Secondary and unspecified malignant neoplasm of lymph nodes of head, face and neck: Secondary | ICD-10-CM | POA: Diagnosis not present

## 2022-04-12 DIAGNOSIS — C098 Malignant neoplasm of overlapping sites of tonsil: Secondary | ICD-10-CM | POA: Insufficient documentation

## 2022-04-12 DIAGNOSIS — C090 Malignant neoplasm of tonsillar fossa: Secondary | ICD-10-CM | POA: Insufficient documentation

## 2022-04-12 LAB — RAD ONC ARIA SESSION SUMMARY
Course Elapsed Days: 13
Plan Fractions Treated to Date: 10
Plan Prescribed Dose Per Fraction: 2 Gy
Plan Total Fractions Prescribed: 30
Plan Total Prescribed Dose: 60 Gy
Reference Point Dosage Given to Date: 20 Gy
Reference Point Session Dosage Given: 2 Gy
Session Number: 10

## 2022-04-13 ENCOUNTER — Other Ambulatory Visit: Payer: Self-pay

## 2022-04-13 ENCOUNTER — Ambulatory Visit
Admission: RE | Admit: 2022-04-13 | Discharge: 2022-04-13 | Disposition: A | Discharge status: Home / Self Care | Payer: 59 | Source: Ambulatory Visit | Attending: Radiation Oncology | Admitting: Radiation Oncology

## 2022-04-13 DIAGNOSIS — C801 Malignant (primary) neoplasm, unspecified: Secondary | ICD-10-CM | POA: Diagnosis not present

## 2022-04-13 DIAGNOSIS — Z510 Encounter for antineoplastic radiation therapy: Secondary | ICD-10-CM | POA: Diagnosis not present

## 2022-04-13 DIAGNOSIS — C098 Malignant neoplasm of overlapping sites of tonsil: Secondary | ICD-10-CM | POA: Diagnosis not present

## 2022-04-13 DIAGNOSIS — C770 Secondary and unspecified malignant neoplasm of lymph nodes of head, face and neck: Secondary | ICD-10-CM | POA: Diagnosis not present

## 2022-04-13 LAB — RAD ONC ARIA SESSION SUMMARY
Course Elapsed Days: 14
Plan Fractions Treated to Date: 11
Plan Prescribed Dose Per Fraction: 2 Gy
Plan Total Fractions Prescribed: 30
Plan Total Prescribed Dose: 60 Gy
Reference Point Dosage Given to Date: 22 Gy
Reference Point Session Dosage Given: 2 Gy
Session Number: 11

## 2022-04-14 ENCOUNTER — Other Ambulatory Visit: Payer: Self-pay

## 2022-04-14 ENCOUNTER — Ambulatory Visit
Admission: RE | Admit: 2022-04-14 | Discharge: 2022-04-14 | Disposition: A | Discharge status: Home / Self Care | Payer: 59 | Source: Ambulatory Visit | Attending: Radiation Oncology | Admitting: Radiation Oncology

## 2022-04-14 DIAGNOSIS — Z510 Encounter for antineoplastic radiation therapy: Secondary | ICD-10-CM | POA: Diagnosis not present

## 2022-04-14 DIAGNOSIS — C770 Secondary and unspecified malignant neoplasm of lymph nodes of head, face and neck: Secondary | ICD-10-CM | POA: Diagnosis not present

## 2022-04-14 DIAGNOSIS — C098 Malignant neoplasm of overlapping sites of tonsil: Secondary | ICD-10-CM | POA: Diagnosis not present

## 2022-04-14 DIAGNOSIS — C801 Malignant (primary) neoplasm, unspecified: Secondary | ICD-10-CM | POA: Diagnosis not present

## 2022-04-14 LAB — RAD ONC ARIA SESSION SUMMARY
Course Elapsed Days: 15
Plan Fractions Treated to Date: 12
Plan Prescribed Dose Per Fraction: 2 Gy
Plan Total Fractions Prescribed: 30
Plan Total Prescribed Dose: 60 Gy
Reference Point Dosage Given to Date: 24 Gy
Reference Point Session Dosage Given: 2 Gy
Session Number: 12

## 2022-04-15 ENCOUNTER — Ambulatory Visit
Admission: RE | Admit: 2022-04-15 | Discharge: 2022-04-15 | Disposition: A | Discharge status: Home / Self Care | Payer: 59 | Source: Ambulatory Visit | Attending: Radiation Oncology | Admitting: Radiation Oncology

## 2022-04-15 ENCOUNTER — Inpatient Hospital Stay: Payer: 59 | Attending: Licensed Clinical Social Worker | Admitting: Dietician

## 2022-04-15 ENCOUNTER — Other Ambulatory Visit: Payer: Self-pay | Admitting: Radiation Oncology

## 2022-04-15 ENCOUNTER — Ambulatory Visit: Payer: 59

## 2022-04-15 ENCOUNTER — Other Ambulatory Visit: Payer: Self-pay

## 2022-04-15 DIAGNOSIS — C098 Malignant neoplasm of overlapping sites of tonsil: Secondary | ICD-10-CM | POA: Diagnosis not present

## 2022-04-15 DIAGNOSIS — Z510 Encounter for antineoplastic radiation therapy: Secondary | ICD-10-CM | POA: Insufficient documentation

## 2022-04-15 DIAGNOSIS — C770 Secondary and unspecified malignant neoplasm of lymph nodes of head, face and neck: Secondary | ICD-10-CM | POA: Diagnosis not present

## 2022-04-15 DIAGNOSIS — C090 Malignant neoplasm of tonsillar fossa: Secondary | ICD-10-CM

## 2022-04-15 DIAGNOSIS — C801 Malignant (primary) neoplasm, unspecified: Secondary | ICD-10-CM | POA: Insufficient documentation

## 2022-04-15 LAB — RAD ONC ARIA SESSION SUMMARY
Course Elapsed Days: 16
Plan Fractions Treated to Date: 13
Plan Prescribed Dose Per Fraction: 2 Gy
Plan Total Fractions Prescribed: 30
Plan Total Prescribed Dose: 60 Gy
Reference Point Dosage Given to Date: 26 Gy
Reference Point Session Dosage Given: 2 Gy
Session Number: 13

## 2022-04-15 MED ORDER — HYDROMORPHONE HCL 1 MG/ML PO LIQD: 1.0000 mL | ORAL | 0 refills | Status: DC | PRN

## 2022-04-15 NOTE — Progress Notes (Signed)
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 She is currently receiving adjuvant radiation therapy (started 7/19).  Patient arrived early for appointment. Reports her schedule was changed and now scheduled for treatment during nutrition appointment. Follow-up completed outside of radiation waiting room today. Patient reports "annoying" throat irritation. She feels constant pressure against middle throat. Patient reports she has been eating soups. If there is no spice, she is tolerating well. Patient adding canned chicken for added protein. She is drinking shakes in the morning. The protein powder has become noticeably gritty. This taste bad. Patient is drinking ~72 ounces of water. This has a metallic taste. She has tried adding flavor drops however after taste of this was horrible. Patient is drinking 2 Ensure Plus. The texture is silky and she is tolerating this well.   Medications: reviewed   Labs: no new labs  Anthropometrics: Last aria wt 121.2 lb on 7/31 decreased 2.6 lbs in 7 days. This is concerning.  7/24 - 123.8 lb  7/3 - 123.4 lb    Estimated Energy Needs  Kcals: 1845-2150 Protein: 92-105 Fluid: 2.1 L  NUTRITION DIAGNOSIS: Predicted suboptimal intake ongoing    INTERVENTION:  Suggested trying CIB powder instead of orgain powder for morning shake Encouraged high calorie soft moist foods for weight maintenance  Suggested drinking water from plastic bottle vs metal water bottle Continue drinking Ensure Plus, recommend 3/day One complimentary case of Ensure Plus High Protein provided     MONITORING, EVALUATION, GOAL: weight trends, intake   NEXT VISIT: Friday August 11

## 2022-04-18 ENCOUNTER — Other Ambulatory Visit: Payer: Self-pay

## 2022-04-18 ENCOUNTER — Ambulatory Visit
Admission: RE | Admit: 2022-04-18 | Discharge: 2022-04-18 | Disposition: A | Discharge status: Home / Self Care | Payer: 59 | Source: Ambulatory Visit | Attending: Radiation Oncology | Admitting: Radiation Oncology

## 2022-04-18 DIAGNOSIS — C098 Malignant neoplasm of overlapping sites of tonsil: Secondary | ICD-10-CM | POA: Diagnosis not present

## 2022-04-18 DIAGNOSIS — Z510 Encounter for antineoplastic radiation therapy: Secondary | ICD-10-CM | POA: Diagnosis not present

## 2022-04-18 DIAGNOSIS — C770 Secondary and unspecified malignant neoplasm of lymph nodes of head, face and neck: Secondary | ICD-10-CM | POA: Diagnosis not present

## 2022-04-18 DIAGNOSIS — C801 Malignant (primary) neoplasm, unspecified: Secondary | ICD-10-CM | POA: Diagnosis not present

## 2022-04-18 LAB — RAD ONC ARIA SESSION SUMMARY
Course Elapsed Days: 19
Plan Fractions Treated to Date: 14
Plan Prescribed Dose Per Fraction: 2 Gy
Plan Total Fractions Prescribed: 30
Plan Total Prescribed Dose: 60 Gy
Reference Point Dosage Given to Date: 28 Gy
Reference Point Session Dosage Given: 2 Gy
Session Number: 14

## 2022-04-19 ENCOUNTER — Ambulatory Visit
Admission: RE | Admit: 2022-04-19 | Discharge: 2022-04-19 | Disposition: A | Discharge status: Home / Self Care | Payer: 59 | Source: Ambulatory Visit | Attending: Radiation Oncology | Admitting: Radiation Oncology

## 2022-04-19 ENCOUNTER — Other Ambulatory Visit: Payer: Self-pay

## 2022-04-19 DIAGNOSIS — C770 Secondary and unspecified malignant neoplasm of lymph nodes of head, face and neck: Secondary | ICD-10-CM | POA: Diagnosis not present

## 2022-04-19 DIAGNOSIS — C098 Malignant neoplasm of overlapping sites of tonsil: Secondary | ICD-10-CM | POA: Diagnosis not present

## 2022-04-19 DIAGNOSIS — Z510 Encounter for antineoplastic radiation therapy: Secondary | ICD-10-CM | POA: Diagnosis not present

## 2022-04-19 DIAGNOSIS — C801 Malignant (primary) neoplasm, unspecified: Secondary | ICD-10-CM | POA: Diagnosis not present

## 2022-04-19 LAB — RAD ONC ARIA SESSION SUMMARY
Course Elapsed Days: 20
Plan Fractions Treated to Date: 15
Plan Prescribed Dose Per Fraction: 2 Gy
Plan Total Fractions Prescribed: 30
Plan Total Prescribed Dose: 60 Gy
Reference Point Dosage Given to Date: 30 Gy
Reference Point Session Dosage Given: 2 Gy
Session Number: 15

## 2022-04-20 ENCOUNTER — Other Ambulatory Visit: Payer: Self-pay

## 2022-04-20 ENCOUNTER — Ambulatory Visit
Admission: RE | Admit: 2022-04-20 | Discharge: 2022-04-20 | Disposition: A | Discharge status: Home / Self Care | Payer: 59 | Source: Ambulatory Visit | Attending: Radiation Oncology | Admitting: Radiation Oncology

## 2022-04-20 DIAGNOSIS — C098 Malignant neoplasm of overlapping sites of tonsil: Secondary | ICD-10-CM | POA: Diagnosis not present

## 2022-04-20 DIAGNOSIS — C770 Secondary and unspecified malignant neoplasm of lymph nodes of head, face and neck: Secondary | ICD-10-CM | POA: Diagnosis not present

## 2022-04-20 DIAGNOSIS — C801 Malignant (primary) neoplasm, unspecified: Secondary | ICD-10-CM | POA: Diagnosis not present

## 2022-04-20 DIAGNOSIS — Z510 Encounter for antineoplastic radiation therapy: Secondary | ICD-10-CM | POA: Diagnosis not present

## 2022-04-20 LAB — RAD ONC ARIA SESSION SUMMARY
Course Elapsed Days: 21
Plan Fractions Treated to Date: 16
Plan Prescribed Dose Per Fraction: 2 Gy
Plan Total Fractions Prescribed: 30
Plan Total Prescribed Dose: 60 Gy
Reference Point Dosage Given to Date: 32 Gy
Reference Point Session Dosage Given: 2 Gy
Session Number: 16

## 2022-04-21 ENCOUNTER — Other Ambulatory Visit: Payer: Self-pay

## 2022-04-21 ENCOUNTER — Ambulatory Visit
Admission: RE | Admit: 2022-04-21 | Discharge: 2022-04-21 | Disposition: A | Discharge status: Home / Self Care | Payer: 59 | Source: Ambulatory Visit | Attending: Radiation Oncology | Admitting: Radiation Oncology

## 2022-04-21 DIAGNOSIS — C098 Malignant neoplasm of overlapping sites of tonsil: Secondary | ICD-10-CM | POA: Diagnosis not present

## 2022-04-21 DIAGNOSIS — C770 Secondary and unspecified malignant neoplasm of lymph nodes of head, face and neck: Secondary | ICD-10-CM | POA: Diagnosis not present

## 2022-04-21 DIAGNOSIS — C801 Malignant (primary) neoplasm, unspecified: Secondary | ICD-10-CM | POA: Diagnosis not present

## 2022-04-21 DIAGNOSIS — Z510 Encounter for antineoplastic radiation therapy: Secondary | ICD-10-CM | POA: Diagnosis not present

## 2022-04-21 LAB — RAD ONC ARIA SESSION SUMMARY
Course Elapsed Days: 22
Plan Fractions Treated to Date: 17
Plan Prescribed Dose Per Fraction: 2 Gy
Plan Total Fractions Prescribed: 30
Plan Total Prescribed Dose: 60 Gy
Reference Point Dosage Given to Date: 34 Gy
Reference Point Session Dosage Given: 2 Gy
Session Number: 17

## 2022-04-22 ENCOUNTER — Ambulatory Visit (HOSPITAL_COMMUNITY): Payer: 59

## 2022-04-22 ENCOUNTER — Ambulatory Visit
Admission: RE | Admit: 2022-04-22 | Discharge: 2022-04-22 | Disposition: A | Discharge status: Home / Self Care | Payer: 59 | Source: Ambulatory Visit | Attending: Radiation Oncology | Admitting: Radiation Oncology

## 2022-04-22 ENCOUNTER — Encounter (HOSPITAL_COMMUNITY): Payer: 59

## 2022-04-22 ENCOUNTER — Other Ambulatory Visit: Payer: Self-pay | Admitting: Radiation Oncology

## 2022-04-22 ENCOUNTER — Other Ambulatory Visit: Payer: Self-pay

## 2022-04-22 ENCOUNTER — Inpatient Hospital Stay: Payer: 59 | Admitting: Dietician

## 2022-04-22 DIAGNOSIS — C098 Malignant neoplasm of overlapping sites of tonsil: Secondary | ICD-10-CM | POA: Diagnosis not present

## 2022-04-22 DIAGNOSIS — C801 Malignant (primary) neoplasm, unspecified: Secondary | ICD-10-CM | POA: Diagnosis not present

## 2022-04-22 DIAGNOSIS — C090 Malignant neoplasm of tonsillar fossa: Secondary | ICD-10-CM

## 2022-04-22 DIAGNOSIS — Z510 Encounter for antineoplastic radiation therapy: Secondary | ICD-10-CM | POA: Diagnosis not present

## 2022-04-22 DIAGNOSIS — C770 Secondary and unspecified malignant neoplasm of lymph nodes of head, face and neck: Secondary | ICD-10-CM | POA: Diagnosis not present

## 2022-04-22 LAB — RAD ONC ARIA SESSION SUMMARY
Course Elapsed Days: 23
Plan Fractions Treated to Date: 18
Plan Prescribed Dose Per Fraction: 2 Gy
Plan Total Fractions Prescribed: 30
Plan Total Prescribed Dose: 60 Gy
Reference Point Dosage Given to Date: 36 Gy
Reference Point Session Dosage Given: 2 Gy
Session Number: 18

## 2022-04-22 NOTE — Progress Notes (Signed)
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 She is receiving adjuvant radiation therapy (started 7/19).   Met with patient in office after radiation. Patient is having difficulty speaking today and is coughing. Nurse navigator [facility]) present at visit today. Patient reports unable to tolerate solid foods. She feels good with drinking 3 protein shakes and 70-80 ounces of water. Yesterday pt recalls one ensure plus HP, one and one half ensure complete, cup of broccoli cheddar soup, small bowl of ice cream (~1300 kcal, 80 g protein). Patient reports she is maintaining 117 lb on home scale. She does not think she needs feeding tube. Patient is agreeable to viewing feeding tube on PEG teaching device.   Medications: Dilaudid 27m/ml liquid (8/4)  Labs: no new labs for review  Anthropometrics:   8/4 - 119.6 lb  7/31 - 121.2 lb  7/24 - 123.8 lb  7/03 - 123.4 lb   Estimated Energy Needs  Kcals: 1845-2150 Protein: 92-105 Fluid: 2.1 L  NUTRITION DIAGNOSIS: Predicted suboptimal intake has evolved into inadequate oral intake related to dry mouth, painful swallow secondary to side effects of therapy as evidenced by liquid diet meeting ~70% of estimated nutrition needs per recall.    INTERVENTION:  Pt will increase Ensure - 6 Ensure Plus HP (2100 kcal, 120 g protein) If unable to tolerate increased oral intake, pt will consider feeding tube placement Discussed water flush, formula, bolus, and PEG care utilizing PEG teaching device - all questions answered Continue drinking 72 ounces of water daily One complimentary case Ensure Plus HP  Support and encouragement provided    MONITORING, EVALUATION, GOAL: weight trends, intake    NEXT VISIT: August 17 with BPamala Hurryafter radiation

## 2022-04-25 ENCOUNTER — Ambulatory Visit: Payer: 59

## 2022-04-25 ENCOUNTER — Ambulatory Visit
Admission: RE | Admit: 2022-04-25 | Discharge: 2022-04-25 | Disposition: A | Discharge status: Home / Self Care | Payer: 59 | Source: Ambulatory Visit | Attending: Radiation Oncology | Admitting: Radiation Oncology

## 2022-04-25 ENCOUNTER — Other Ambulatory Visit: Payer: Self-pay

## 2022-04-25 DIAGNOSIS — C098 Malignant neoplasm of overlapping sites of tonsil: Secondary | ICD-10-CM | POA: Diagnosis not present

## 2022-04-25 DIAGNOSIS — C770 Secondary and unspecified malignant neoplasm of lymph nodes of head, face and neck: Secondary | ICD-10-CM | POA: Diagnosis not present

## 2022-04-25 DIAGNOSIS — Z510 Encounter for antineoplastic radiation therapy: Secondary | ICD-10-CM | POA: Diagnosis not present

## 2022-04-25 DIAGNOSIS — C801 Malignant (primary) neoplasm, unspecified: Secondary | ICD-10-CM | POA: Diagnosis not present

## 2022-04-25 LAB — RAD ONC ARIA SESSION SUMMARY
Course Elapsed Days: 26
Plan Fractions Treated to Date: 19
Plan Prescribed Dose Per Fraction: 2 Gy
Plan Total Fractions Prescribed: 30
Plan Total Prescribed Dose: 60 Gy
Reference Point Dosage Given to Date: 38 Gy
Reference Point Session Dosage Given: 2 Gy
Session Number: 19

## 2022-04-25 MED ORDER — LORAZEPAM 0.5 MG PO TABS
ORAL_TABLET | ORAL | 0 refills | Status: DC
  Filled 2022-04-25: qty 20, 7d supply, fill #0

## 2022-04-26 ENCOUNTER — Other Ambulatory Visit: Payer: Self-pay

## 2022-04-26 ENCOUNTER — Other Ambulatory Visit (HOSPITAL_COMMUNITY): Payer: Self-pay

## 2022-04-26 ENCOUNTER — Ambulatory Visit
Admission: RE | Admit: 2022-04-26 | Discharge: 2022-04-26 | Disposition: A | Discharge status: Home / Self Care | Payer: 59 | Source: Ambulatory Visit | Attending: Radiation Oncology | Admitting: Radiation Oncology

## 2022-04-26 DIAGNOSIS — C098 Malignant neoplasm of overlapping sites of tonsil: Secondary | ICD-10-CM | POA: Diagnosis not present

## 2022-04-26 DIAGNOSIS — Z510 Encounter for antineoplastic radiation therapy: Secondary | ICD-10-CM | POA: Diagnosis not present

## 2022-04-26 DIAGNOSIS — C801 Malignant (primary) neoplasm, unspecified: Secondary | ICD-10-CM | POA: Diagnosis not present

## 2022-04-26 DIAGNOSIS — C770 Secondary and unspecified malignant neoplasm of lymph nodes of head, face and neck: Secondary | ICD-10-CM | POA: Diagnosis not present

## 2022-04-26 LAB — RAD ONC ARIA SESSION SUMMARY
Course Elapsed Days: 27
Plan Fractions Treated to Date: 20
Plan Prescribed Dose Per Fraction: 2 Gy
Plan Total Fractions Prescribed: 30
Plan Total Prescribed Dose: 60 Gy
Reference Point Dosage Given to Date: 40 Gy
Reference Point Session Dosage Given: 2 Gy
Session Number: 20

## 2022-04-27 ENCOUNTER — Ambulatory Visit
Admission: RE | Admit: 2022-04-27 | Discharge: 2022-04-27 | Disposition: A | Discharge status: Home / Self Care | Payer: 59 | Source: Ambulatory Visit | Attending: Radiation Oncology | Admitting: Radiation Oncology

## 2022-04-27 ENCOUNTER — Other Ambulatory Visit: Payer: Self-pay

## 2022-04-27 DIAGNOSIS — C770 Secondary and unspecified malignant neoplasm of lymph nodes of head, face and neck: Secondary | ICD-10-CM | POA: Diagnosis not present

## 2022-04-27 DIAGNOSIS — C098 Malignant neoplasm of overlapping sites of tonsil: Secondary | ICD-10-CM | POA: Diagnosis not present

## 2022-04-27 DIAGNOSIS — Z510 Encounter for antineoplastic radiation therapy: Secondary | ICD-10-CM | POA: Diagnosis not present

## 2022-04-27 DIAGNOSIS — C801 Malignant (primary) neoplasm, unspecified: Secondary | ICD-10-CM | POA: Diagnosis not present

## 2022-04-27 LAB — RAD ONC ARIA SESSION SUMMARY
Course Elapsed Days: 28
Plan Fractions Treated to Date: 21
Plan Prescribed Dose Per Fraction: 2 Gy
Plan Total Fractions Prescribed: 30
Plan Total Prescribed Dose: 60 Gy
Reference Point Dosage Given to Date: 42 Gy
Reference Point Session Dosage Given: 2 Gy
Session Number: 21

## 2022-04-28 ENCOUNTER — Ambulatory Visit: Payer: 59 | Attending: Radiation Oncology

## 2022-04-28 ENCOUNTER — Other Ambulatory Visit: Payer: Self-pay

## 2022-04-28 ENCOUNTER — Ambulatory Visit
Admission: RE | Admit: 2022-04-28 | Discharge: 2022-04-28 | Disposition: A | Discharge status: Home / Self Care | Payer: 59 | Source: Ambulatory Visit | Attending: Radiation Oncology | Admitting: Radiation Oncology

## 2022-04-28 ENCOUNTER — Inpatient Hospital Stay: Payer: 59 | Admitting: Nutrition

## 2022-04-28 DIAGNOSIS — R1312 Dysphagia, oropharyngeal phase: Secondary | ICD-10-CM | POA: Insufficient documentation

## 2022-04-28 DIAGNOSIS — C770 Secondary and unspecified malignant neoplasm of lymph nodes of head, face and neck: Secondary | ICD-10-CM | POA: Diagnosis not present

## 2022-04-28 DIAGNOSIS — C098 Malignant neoplasm of overlapping sites of tonsil: Secondary | ICD-10-CM | POA: Diagnosis not present

## 2022-04-28 DIAGNOSIS — Z510 Encounter for antineoplastic radiation therapy: Secondary | ICD-10-CM | POA: Diagnosis not present

## 2022-04-28 DIAGNOSIS — C801 Malignant (primary) neoplasm, unspecified: Secondary | ICD-10-CM | POA: Diagnosis not present

## 2022-04-28 LAB — RAD ONC ARIA SESSION SUMMARY
Course Elapsed Days: 29
Plan Fractions Treated to Date: 22
Plan Prescribed Dose Per Fraction: 2 Gy
Plan Total Fractions Prescribed: 30
Plan Total Prescribed Dose: 60 Gy
Reference Point Dosage Given to Date: 44 Gy
Reference Point Session Dosage Given: 2 Gy
Session Number: 22

## 2022-04-28 NOTE — Progress Notes (Signed)
Nutrition follow-up completed with patient and daughter.  She is receiving adjuvant radiation therapy for SCC of the left tonsil status post radical left tonsillectomy.  Weight documented as 122.2 pounds on August 14.  Patient complains of increased pain, continued dysphagia and constant nausea most of the day.  She is requesting to increase her Ativan.  She also noted she does not like the way her pain medication makes her feel so she does not take it.  She is trying to drink between 4 and 5 cartons of Ensure Plus high-protein.  She acknowledges her goal rate is 6 cartons.  She reports she is able to swallow these products without much difficulty.  At this point, she mostly drinks Ensure products and water.  Estimated nutrition needs: 1845-2150 cal, 92-105 g protein, 2.1 L fluid.  Nutrition diagnosis: Inadequate oral intake, ongoing.  Intervention: Patient will continue to work to consume 6 Ensure Plus high-protein daily.  This will provide 2100 cal and 120 g of protein. Encourage patient to try to drink a minimum of 1200 cc fluid daily. Provided 1 complementary case of Ensure Plus high-protein. Provided recipes for patient and daughter for increased variety.  Monitoring, evaluation, goals: Patient will tolerate increased calories and protein to minimize weight loss.  Next visit: Tuesday, August 22 after radiation therapy with Vinnie Level.  **Disclaimer: This note was dictated with voice recognition software. Similar sounding words can inadvertently be transcribed and this note may contain transcription errors which may not have been corrected upon publication of note.**

## 2022-04-28 NOTE — Therapy (Signed)
[facility] [address], [place], [place], [address] Phone: [phone]   Fax:  [phone]  Speech Language Pathology Treatment  Patient Details  Name: Jennifer [name] MRN: [id number] Date of Birth: [birth year] No data recorded  Encounter Date: 04/28/2022   End of Session - 04/28/22 1643     Visit Number 2    Number of Visits 7    Date for SLP Re-Evaluation 06/30/22    SLP Start Time 76    SLP Stop Time  1319    SLP Time Calculation (min) 39 min    Activity Tolerance Patient tolerated treatment well             Past Medical History:  Diagnosis Date   Anemia    Bowel obstruction ([place])    Cancer ([place]) 12-16-2021   metastatic squamous cell carcimoma   Fibroids    GERD (gastroesophageal reflux disease)    H/O small bowel obstruction    Hyperlipidemia    no meds, diet controlled   Restless legs syndrome (RLS)     Past Surgical History:  Procedure Laterality Date   ABDOMINAL HYSTERECTOMY     DIAGNOSTIC LAPAROSCOPY     x several with lysis of adhesions   EXPLORATORY LAPAROTOMY     had bowel obstruction   EYE SURGERY Bilateral    Lasik   LARYNGOSCOPY AND ESOPHAGOSCOPY N/A 01/07/2022   Procedure: DIRECT LARYNGOSCOPY WITH BIOPSY; ESOPHAGOSCOPY; FROZEN SECTIONS;  Surgeon: [name], MD;  Location: [facility];  Service: ENT;  Laterality: N/A;   ROTATOR CUFF REPAIR Right 2017   SMALL INTESTINE SURGERY  [id number]   [facility] - Dr Adriana Reams    There were no vitals filed for this visit.   Subjective Assessment - 04/28/22 1657     Subjective Pt indicates she performed HEP in full until approx two weeks ago, now completes less.    Patient is accompained by: Family member   daughter   Currently in Pain? Yes    Pain Location Throat    Pain Orientation Mid    Pain Descriptors / Indicators Sore    Pain Type Acute pain    Pain Onset 1 to 4 weeks ago                   ADULT SLP TREATMENT - 04/28/22 1644        General Information   Behavior/Cognition Alert;Cooperative   flat affect     Treatment Provided   Treatment provided Dysphagia      Dysphagia Treatment   Treatment Methods Skilled observation;Compensation strategy training;Patient/caregiver education    Patient observed directly with PO's Yes    Type of PO's observed Thin liquids    Liquids provided via Cup    Oral Phase Signs & Symptoms Other (comment)   none noted today   Pharyngeal Phase Signs & Symptoms Other (comment)   none noted today   Other treatment/comments [name] stated she did the HEP until approx 2 weeks ago when she could no longer complete exercises due to gagging and choking  sensation not due to s/sx aspiration but due to abnormal feeling of uvula resting on her posterior lingual surface and flexing forward when swallowing. SLP had [name] attempt multiple postural changes to alleviate feeling of uvula resting on posterior surface of the tongue when swallowing but was unsuccessful. SLP suggested liquid lidocaine and OTC meds such as Chloraseptic or Cepacol for topical anesthetics so she could better tolerate  POs. She is no longer eating solids - is trying to drink as many calories and nutrition as possible. She met with dietician just prior to this appointment and will strive to consume 5 Ensures/day to maintain weight. SLP encouraged pt to do what she can with HEP - cycle through the HEP and complete what she feels she can do at that time and then return 1-2 hours later and repeat. She told SLP rationale for HEP today. No overt s/sx aspiration PNA; no overt s/sx oral or pharyngeal dysphagia although SLP suspects some retention in pharynx at some point due to very likely pharyngeal edema. SLP taught pt chin tuck resistance exercise today but she politely stated she was unlikely to perform this due to a mild choking feeling in her throat following 3 reps.      Assessment / Recommendations / Plan   Plan Continue with current plan of care       Dysphagia Recommendations   Diet recommendations Other(comment)   liquids   Medication Administration --   as tolerated     Progression Toward Goals   Progression toward goals Progressing toward goals             CLINICAL IMPRESSION: Patient is a 61 y.o. female who was seen today for swallowing as she contingues radiation therapy. Today pt drank thin liquids without overt s/sx oral or pharyngeal difficulty including aspiration. At this time pt swallowing is deemed [facility] with liquids. NThere are no overt s/s aspiration PNA observed by SLP nor any reported by pt at this time. Data indicate that pt's swallow ability will likely decrease over the course of radiation/chemoradiation therapy and could very well decline over time following the conclusion of that therapy due to muscle disuse atrophy and/or muscle fibrosis. Pt will cont to need to be seen by SLP in order to assess safety of PO intake, assess the need for recommending any objective swallow assessment, and ensuring pt is correctly completing the individualized HEP.   OBJECTIVE IMPAIRMENTS include dysphagia. These impairments are limiting patient from safety when swallowing. Factors affecting potential to achieve goals and functional outcome are  none . Patient will benefit from skilled SLP services to address above impairments and improve overall function.   REHAB POTENTIAL: Excellent     GOALS: Goals reviewed with patient? No   SHORT TERM GOALS: Target completion:  first 3 therapy sessions (overall visit #4)       pt will complete HEP with rare min A  Baseline: Goal status: Ongoing   2.  pt will tell SLP why pt is completing HEP with modified independence Baseline:  Goal status: Met   3.  pt will describe 3 overt s/s aspiration PNA with modified independence Baseline:  Goal status: Ongoing   4.  pt will tell SLP how a food journal could hasten return to a more normalized diet Baseline:  Goal status: Ongoing     LONG  TERM GOALS: Target: sixth ST session (overall visit #7)   pt will complete HEP with modified independence over two visits Baseline:  Goal status: Ongoing   2.  pt will describe how to modify HEP over time, and the timeline associated with reduction in HEP frequency with modified independence over two sessions Baseline:  Goal status: Ongoing     PLAN: SLP FREQUENCY:  once every approx 4 weeks   SLP DURATION:  7 sessions   PLANNED INTERVENTIONS: Aspiration precaution training, Pharyngeal strengthening exercises, Diet toleration management , Trials of  upgraded texture/liquids, SLP instruction and feedback, Compensatory strategies, and Patient/family education        Patient will benefit from skilled therapeutic intervention in order to improve the following deficits and impairments:   Dysphagia, oropharyngeal phase    Problem List Patient Active Problem List   Diagnosis Date Noted   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[place]) 03/14/2022   Gingival recession, generalized 03/11/2022   Encounter for preoperative dental examination 03/09/2022   Teeth missing 03/09/2022   Incipient enamel caries 03/09/2022   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   Tonsil cancer ([name]) 01/25/2022   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 [place], [place], [address] Phone: [phone]   Fax:   [phone]   Name: LOYOLA SANTINO MRN: [id number] Date of Birth: May 14, 1961

## 2022-04-29 ENCOUNTER — Other Ambulatory Visit: Payer: Self-pay | Admitting: Radiation Oncology

## 2022-04-29 ENCOUNTER — Ambulatory Visit
Admission: RE | Admit: 2022-04-29 | Discharge: 2022-04-29 | Disposition: A | Discharge status: Home / Self Care | Payer: 59 | Source: Ambulatory Visit | Attending: Radiation Oncology | Admitting: Radiation Oncology

## 2022-04-29 ENCOUNTER — Other Ambulatory Visit: Payer: Self-pay

## 2022-04-29 DIAGNOSIS — C098 Malignant neoplasm of overlapping sites of tonsil: Secondary | ICD-10-CM | POA: Diagnosis not present

## 2022-04-29 DIAGNOSIS — C770 Secondary and unspecified malignant neoplasm of lymph nodes of head, face and neck: Secondary | ICD-10-CM | POA: Diagnosis not present

## 2022-04-29 DIAGNOSIS — C090 Malignant neoplasm of tonsillar fossa: Secondary | ICD-10-CM

## 2022-04-29 DIAGNOSIS — C801 Malignant (primary) neoplasm, unspecified: Secondary | ICD-10-CM | POA: Diagnosis not present

## 2022-04-29 DIAGNOSIS — Z510 Encounter for antineoplastic radiation therapy: Secondary | ICD-10-CM | POA: Diagnosis not present

## 2022-04-29 LAB — RAD ONC ARIA SESSION SUMMARY
Course Elapsed Days: 30
Plan Fractions Treated to Date: 23
Plan Prescribed Dose Per Fraction: 2 Gy
Plan Total Fractions Prescribed: 30
Plan Total Prescribed Dose: 60 Gy
Reference Point Dosage Given to Date: 46 Gy
Reference Point Session Dosage Given: 2 Gy
Session Number: 23

## 2022-04-29 MED ORDER — DEXAMETHASONE 2 MG PO TABS: ORAL_TABLET | ORAL | 1 refills | Status: DC

## 2022-04-29 MED ORDER — LORAZEPAM 0.5 MG PO TABS: ORAL_TABLET | ORAL | 0 refills | Status: DC

## 2022-04-29 MED ORDER — OMEPRAZOLE 20 MG PO CPDR: DELAYED_RELEASE_CAPSULE | ORAL | 1 refills | Status: DC

## 2022-04-30 ENCOUNTER — Inpatient Hospital Stay: Payer: 59

## 2022-04-30 VITALS — BP 103/64 | HR 77 | Temp 98.0°F | Resp 16

## 2022-04-30 DIAGNOSIS — C090 Malignant neoplasm of tonsillar fossa: Secondary | ICD-10-CM

## 2022-04-30 DIAGNOSIS — C770 Secondary and unspecified malignant neoplasm of lymph nodes of head, face and neck: Secondary | ICD-10-CM | POA: Diagnosis not present

## 2022-04-30 DIAGNOSIS — C801 Malignant (primary) neoplasm, unspecified: Secondary | ICD-10-CM | POA: Diagnosis not present

## 2022-04-30 DIAGNOSIS — Z510 Encounter for antineoplastic radiation therapy: Secondary | ICD-10-CM | POA: Diagnosis not present

## 2022-04-30 MED ORDER — SODIUM CHLORIDE 0.9 % IV SOLN: Freq: Once | INTRAVENOUS | Status: AC

## 2022-04-30 NOTE — Patient Instructions (Signed)
Rehydration, Adult Rehydration is the replacement of body fluids, salts, and minerals (electrolytes) that are lost during dehydration. Dehydration is when there is not enough water or other fluids in the body. This happens when you lose more fluids than you take in. Common causes of dehydration include: Not drinking enough fluids. This can occur when you are ill or doing activities that require a lot of energy, especially in hot weather. Conditions that cause loss of water or other fluids, such as diarrhea, vomiting, sweating, or urinating a lot. Other illnesses, such as fever or infection. Certain medicines, such as those that remove excess fluid from the body (diuretics). Symptoms of mild or moderate dehydration may include thirst, dry lips and mouth, and dizziness. Symptoms of severe dehydration may include increased heart rate, confusion, fainting, and not urinating. For severe dehydration, you may need to get fluids through an IV at the hospital. For mild or moderate dehydration, you can usually rehydrate at home by drinking certain fluids as told by your health care provider. What are the risks? Generally, rehydration is safe. However, taking in too much fluid (overhydration) can be a problem. This is rare. Overhydration can cause an electrolyte imbalance, kidney failure, or a decrease in salt (sodium) levels in the body. Supplies needed You will need an oral rehydration solution (ORS) if your health care provider tells you to use one. This is a drink to treat dehydration. It can be found in pharmacies and retail stores. How to rehydrate Fluids Follow instructions from your health care provider for rehydration. The kind of fluid and the amount you should drink depend on your condition. In general, you should choose drinks that you prefer. If told by your health care provider, drink an ORS. Make an ORS by following instructions on the package. Start by drinking small amounts, about  cup (120  mL) every 5-10 minutes. Slowly increase how much you drink until you have taken the amount recommended by your health care provider. Drink enough clear fluids to keep your urine pale yellow. If you were told to drink an ORS, finish it first, then start slowly drinking other clear fluids. Drink fluids such as: Water. This includes sparkling water and flavored water. Drinking only water can lead to having too little sodium in your body (hyponatremia). Follow the advice of your health care provider. Water from ice chips you suck on. Fruit juice with water you add to it (diluted). Sports drinks. Hot or cold herbal teas. Broth-based soups. Milk or milk products. Food Follow instructions from your health care provider about what to eat while you rehydrate. Your health care provider may recommend that you slowly begin eating regular foods in small amounts. Eat foods that contain a healthy balance of electrolytes, such as bananas, oranges, potatoes, tomatoes, and spinach. Avoid foods that are greasy or contain a lot of sugar. In some cases, you may get nutrition through a feeding tube that is passed through your nose and into your stomach (nasogastric tube, or NG tube). This may be done if you have uncontrolled vomiting or diarrhea. Beverages to avoid  Certain beverages may make dehydration worse. While you rehydrate, avoid drinking alcohol. How to tell if you are recovering from dehydration You may be recovering from dehydration if: You are urinating more often than before you started rehydrating. Your urine is pale yellow. Your energy level improves. You vomit less frequently. You have diarrhea less frequently. Your appetite improves or returns to normal. You feel less dizzy or less light-headed.   Your skin tone and color start to look more normal. Follow these instructions at home: Take over-the-counter and prescription medicines only as told by your health care provider. Do not take sodium  tablets. Doing this can lead to having too much sodium in your body (hypernatremia). Contact a health care provider if: You continue to have symptoms of mild or moderate dehydration, such as: Thirst. Dry lips. Slightly dry mouth. Dizziness. Dark urine or less urine than normal. Muscle cramps. You continue to vomit or have diarrhea. Get help right away if you: Have symptoms of dehydration that get worse. Have a fever. Have a severe headache. Have been vomiting and the following happens: Your vomiting gets worse or does not go away. Your vomit includes blood or green matter (bile). You cannot eat or drink without vomiting. Have problems with urination or bowel movements, such as: Diarrhea that gets worse or does not go away. Blood in your stool (feces). This may cause stool to look black and tarry. Not urinating, or urinating only a small amount of very dark urine, within 6-8 hours. Have trouble breathing. Have symptoms that get worse with treatment. These symptoms may represent a serious problem that is an emergency. Do not wait to see if the symptoms will go away. Get medical help right away. Call your local emergency services (911 in the [place] Do not drive yourself to the hospital. Summary Rehydration is the replacement of body fluids and minerals (electrolytes) that are lost during dehydration. Follow instructions from your health care provider for rehydration. The kind of fluid and amount you should drink depend on your condition. Slowly increase how much you drink until you have taken the amount recommended by your health care provider. Contact your health care provider if you continue to show signs of mild or moderate dehydration. This information is not intended to replace advice given to you by your health care provider. Make sure you discuss any questions you have with your health care provider. Document Revised: 10/30/2019 Document Reviewed: 09/09/2019 Elsevier Patient  Education  2023 [employer]  

## 2022-05-02 ENCOUNTER — Ambulatory Visit: Payer: 59

## 2022-05-02 ENCOUNTER — Ambulatory Visit
Admission: RE | Admit: 2022-05-02 | Discharge: 2022-05-02 | Disposition: A | Discharge status: Home / Self Care | Payer: 59 | Source: Ambulatory Visit | Attending: Radiation Oncology | Admitting: Radiation Oncology

## 2022-05-02 ENCOUNTER — Inpatient Hospital Stay: Payer: 59

## 2022-05-02 ENCOUNTER — Other Ambulatory Visit: Payer: Self-pay

## 2022-05-02 VITALS — BP 128/75 | HR 65 | Temp 97.8°F | Resp 16

## 2022-05-02 DIAGNOSIS — Z510 Encounter for antineoplastic radiation therapy: Secondary | ICD-10-CM | POA: Diagnosis not present

## 2022-05-02 DIAGNOSIS — C098 Malignant neoplasm of overlapping sites of tonsil: Secondary | ICD-10-CM | POA: Diagnosis not present

## 2022-05-02 DIAGNOSIS — C801 Malignant (primary) neoplasm, unspecified: Secondary | ICD-10-CM | POA: Diagnosis not present

## 2022-05-02 DIAGNOSIS — C090 Malignant neoplasm of tonsillar fossa: Secondary | ICD-10-CM

## 2022-05-02 DIAGNOSIS — C770 Secondary and unspecified malignant neoplasm of lymph nodes of head, face and neck: Secondary | ICD-10-CM | POA: Diagnosis not present

## 2022-05-02 LAB — RAD ONC ARIA SESSION SUMMARY
Course Elapsed Days: 33
Plan Fractions Treated to Date: 24
Plan Prescribed Dose Per Fraction: 2 Gy
Plan Total Fractions Prescribed: 30
Plan Total Prescribed Dose: 60 Gy
Reference Point Dosage Given to Date: 48 Gy
Reference Point Session Dosage Given: 2 Gy
Session Number: 24

## 2022-05-02 LAB — BASIC METABOLIC PANEL - CANCER CENTER ONLY
Anion gap: 2 — ABNORMAL LOW (ref 5–15)
BUN: 26 mg/dL — ABNORMAL HIGH (ref 6–20)
CO2: 29 mmol/L (ref 22–32)
Calcium: 9.1 mg/dL (ref 8.9–10.3)
Chloride: 107 mmol/L (ref 98–111)
Creatinine: 0.68 mg/dL (ref 0.44–1.00)
GFR, Estimated: >60 mL/min (ref >60)
Glucose, Bld: 101 mg/dL — ABNORMAL HIGH (ref 70–99)
Potassium: 4 mmol/L (ref 3.5–5.1)
Sodium: 138 mmol/L (ref 135–145)

## 2022-05-02 MED ORDER — SODIUM CHLORIDE 0.9 % IV SOLN: Freq: Once | INTRAVENOUS | Status: AC

## 2022-05-02 NOTE — Patient Instructions (Signed)
Rehydration, Adult Rehydration is the replacement of body fluids, salts, and minerals (electrolytes) that are lost during dehydration. Dehydration is when there is not enough water or other fluids in the body. This happens when you lose more fluids than you take in. Common causes of dehydration include: Not drinking enough fluids. This can occur when you are ill or doing activities that require a lot of energy, especially in hot weather. Conditions that cause loss of water or other fluids, such as diarrhea, vomiting, sweating, or urinating a lot. Other illnesses, such as fever or infection. Certain medicines, such as those that remove excess fluid from the body (diuretics). Symptoms of mild or moderate dehydration may include thirst, dry lips and mouth, and dizziness. Symptoms of severe dehydration may include increased heart rate, confusion, fainting, and not urinating. For severe dehydration, you may need to get fluids through an IV at the hospital. For mild or moderate dehydration, you can usually rehydrate at home by drinking certain fluids as told by your health care provider. What are the risks? Generally, rehydration is safe. However, taking in too much fluid (overhydration) can be a problem. This is rare. Overhydration can cause an electrolyte imbalance, kidney failure, or a decrease in salt (sodium) levels in the body. Supplies needed You will need an oral rehydration solution (ORS) if your health care provider tells you to use one. This is a drink to treat dehydration. It can be found in pharmacies and retail stores. How to rehydrate Fluids Follow instructions from your health care provider for rehydration. The kind of fluid and the amount you should drink depend on your condition. In general, you should choose drinks that you prefer. If told by your health care provider, drink an ORS. Make an ORS by following instructions on the package. Start by drinking small amounts, about  cup (120  mL) every 5-10 minutes. Slowly increase how much you drink until you have taken the amount recommended by your health care provider. Drink enough clear fluids to keep your urine pale yellow. If you were told to drink an ORS, finish it first, then start slowly drinking other clear fluids. Drink fluids such as: Water. This includes sparkling water and flavored water. Drinking only water can lead to having too little sodium in your body (hyponatremia). Follow the advice of your health care provider. Water from ice chips you suck on. Fruit juice with water you add to it (diluted). Sports drinks. Hot or cold herbal teas. Broth-based soups. Milk or milk products. Food Follow instructions from your health care provider about what to eat while you rehydrate. Your health care provider may recommend that you slowly begin eating regular foods in small amounts. Eat foods that contain a healthy balance of electrolytes, such as bananas, oranges, potatoes, tomatoes, and spinach. Avoid foods that are greasy or contain a lot of sugar. In some cases, you may get nutrition through a feeding tube that is passed through your nose and into your stomach (nasogastric tube, or NG tube). This may be done if you have uncontrolled vomiting or diarrhea. Beverages to avoid  Certain beverages may make dehydration worse. While you rehydrate, avoid drinking alcohol. How to tell if you are recovering from dehydration You may be recovering from dehydration if: You are urinating more often than before you started rehydrating. Your urine is pale yellow. Your energy level improves. You vomit less frequently. You have diarrhea less frequently. Your appetite improves or returns to normal. You feel less dizzy or less light-headed.   Your skin tone and color start to look more normal. Follow these instructions at home: Take over-the-counter and prescription medicines only as told by your health care provider. Do not take sodium  tablets. Doing this can lead to having too much sodium in your body (hypernatremia). Contact a health care provider if: You continue to have symptoms of mild or moderate dehydration, such as: Thirst. Dry lips. Slightly dry mouth. Dizziness. Dark urine or less urine than normal. Muscle cramps. You continue to vomit or have diarrhea. Get help right away if you: Have symptoms of dehydration that get worse. Have a fever. Have a severe headache. Have been vomiting and the following happens: Your vomiting gets worse or does not go away. Your vomit includes blood or green matter (bile). You cannot eat or drink without vomiting. Have problems with urination or bowel movements, such as: Diarrhea that gets worse or does not go away. Blood in your stool (feces). This may cause stool to look black and tarry. Not urinating, or urinating only a small amount of very dark urine, within 6-8 hours. Have trouble breathing. Have symptoms that get worse with treatment. These symptoms may represent a serious problem that is an emergency. Do not wait to see if the symptoms will go away. Get medical help right away. Call your local emergency services (911 in the [place] Do not drive yourself to the hospital. Summary Rehydration is the replacement of body fluids and minerals (electrolytes) that are lost during dehydration. Follow instructions from your health care provider for rehydration. The kind of fluid and amount you should drink depend on your condition. Slowly increase how much you drink until you have taken the amount recommended by your health care provider. Contact your health care provider if you continue to show signs of mild or moderate dehydration. This information is not intended to replace advice given to you by your health care provider. Make sure you discuss any questions you have with your health care provider. Document Revised: 10/30/2019 Document Reviewed: 09/09/2019 Elsevier Patient  Education  2023 [employer]  

## 2022-05-03 ENCOUNTER — Ambulatory Visit
Admission: RE | Admit: 2022-05-03 | Discharge: 2022-05-03 | Disposition: A | Discharge status: Home / Self Care | Payer: 59 | Source: Ambulatory Visit | Attending: Radiation Oncology | Admitting: Radiation Oncology

## 2022-05-03 ENCOUNTER — Other Ambulatory Visit: Payer: Self-pay

## 2022-05-03 ENCOUNTER — Ambulatory Visit: Payer: 59

## 2022-05-03 ENCOUNTER — Encounter: Payer: 59 | Admitting: Dietician

## 2022-05-03 DIAGNOSIS — C801 Malignant (primary) neoplasm, unspecified: Secondary | ICD-10-CM | POA: Diagnosis not present

## 2022-05-03 DIAGNOSIS — Z510 Encounter for antineoplastic radiation therapy: Secondary | ICD-10-CM | POA: Diagnosis not present

## 2022-05-03 DIAGNOSIS — C098 Malignant neoplasm of overlapping sites of tonsil: Secondary | ICD-10-CM | POA: Diagnosis not present

## 2022-05-03 DIAGNOSIS — C770 Secondary and unspecified malignant neoplasm of lymph nodes of head, face and neck: Secondary | ICD-10-CM | POA: Diagnosis not present

## 2022-05-03 LAB — RAD ONC ARIA SESSION SUMMARY
Course Elapsed Days: 34
Plan Fractions Treated to Date: 25
Plan Prescribed Dose Per Fraction: 2 Gy
Plan Total Fractions Prescribed: 30
Plan Total Prescribed Dose: 60 Gy
Reference Point Dosage Given to Date: 50 Gy
Reference Point Session Dosage Given: 2 Gy
Session Number: 25

## 2022-05-04 ENCOUNTER — Inpatient Hospital Stay: Payer: 59

## 2022-05-04 ENCOUNTER — Ambulatory Visit
Admission: RE | Admit: 2022-05-04 | Discharge: 2022-05-04 | Disposition: A | Discharge status: Home / Self Care | Payer: 59 | Source: Ambulatory Visit | Attending: Radiation Oncology | Admitting: Radiation Oncology

## 2022-05-04 ENCOUNTER — Other Ambulatory Visit: Payer: Self-pay

## 2022-05-04 VITALS — BP 133/64 | HR 69 | Resp 17

## 2022-05-04 DIAGNOSIS — C098 Malignant neoplasm of overlapping sites of tonsil: Secondary | ICD-10-CM | POA: Diagnosis not present

## 2022-05-04 DIAGNOSIS — C801 Malignant (primary) neoplasm, unspecified: Secondary | ICD-10-CM | POA: Diagnosis not present

## 2022-05-04 DIAGNOSIS — C770 Secondary and unspecified malignant neoplasm of lymph nodes of head, face and neck: Secondary | ICD-10-CM | POA: Diagnosis not present

## 2022-05-04 DIAGNOSIS — C090 Malignant neoplasm of tonsillar fossa: Secondary | ICD-10-CM

## 2022-05-04 DIAGNOSIS — Z510 Encounter for antineoplastic radiation therapy: Secondary | ICD-10-CM | POA: Diagnosis not present

## 2022-05-04 LAB — RAD ONC ARIA SESSION SUMMARY
Course Elapsed Days: 35
Plan Fractions Treated to Date: 26
Plan Prescribed Dose Per Fraction: 2 Gy
Plan Total Fractions Prescribed: 30
Plan Total Prescribed Dose: 60 Gy
Reference Point Dosage Given to Date: 52 Gy
Reference Point Session Dosage Given: 2 Gy
Session Number: 26

## 2022-05-04 MED ORDER — SODIUM CHLORIDE 0.9 % IV SOLN: Freq: Once | INTRAVENOUS | Status: AC

## 2022-05-04 NOTE — Patient Instructions (Signed)
Rehydration, Adult Rehydration is the replacement of body fluids, salts, and minerals (electrolytes) that are lost during dehydration. Dehydration is when there is not enough water or other fluids in the body. This happens when you lose more fluids than you take in. Common causes of dehydration include: Not drinking enough fluids. This can occur when you are ill or doing activities that require a lot of energy, especially in hot weather. Conditions that cause loss of water or other fluids, such as diarrhea, vomiting, sweating, or urinating a lot. Other illnesses, such as fever or infection. Certain medicines, such as those that remove excess fluid from the body (diuretics). Symptoms of mild or moderate dehydration may include thirst, dry lips and mouth, and dizziness. Symptoms of severe dehydration may include increased heart rate, confusion, fainting, and not urinating. For severe dehydration, you may need to get fluids through an IV at the hospital. For mild or moderate dehydration, you can usually rehydrate at home by drinking certain fluids as told by your health care provider. What are the risks? Generally, rehydration is safe. However, taking in too much fluid (overhydration) can be a problem. This is rare. Overhydration can cause an electrolyte imbalance, kidney failure, or a decrease in salt (sodium) levels in the body. Supplies needed You will need an oral rehydration solution (ORS) if your health care provider tells you to use one. This is a drink to treat dehydration. It can be found in pharmacies and retail stores. How to rehydrate Fluids Follow instructions from your health care provider for rehydration. The kind of fluid and the amount you should drink depend on your condition. In general, you should choose drinks that you prefer. If told by your health care provider, drink an ORS. Make an ORS by following instructions on the package. Start by drinking small amounts, about  cup (120  mL) every 5-10 minutes. Slowly increase how much you drink until you have taken the amount recommended by your health care provider. Drink enough clear fluids to keep your urine pale yellow. If you were told to drink an ORS, finish it first, then start slowly drinking other clear fluids. Drink fluids such as: Water. This includes sparkling water and flavored water. Drinking only water can lead to having too little sodium in your body (hyponatremia). Follow the advice of your health care provider. Water from ice chips you suck on. Fruit juice with water you add to it (diluted). Sports drinks. Hot or cold herbal teas. Broth-based soups. Milk or milk products. Food Follow instructions from your health care provider about what to eat while you rehydrate. Your health care provider may recommend that you slowly begin eating regular foods in small amounts. Eat foods that contain a healthy balance of electrolytes, such as bananas, oranges, potatoes, tomatoes, and spinach. Avoid foods that are greasy or contain a lot of sugar. In some cases, you may get nutrition through a feeding tube that is passed through your nose and into your stomach (nasogastric tube, or NG tube). This may be done if you have uncontrolled vomiting or diarrhea. Beverages to avoid  Certain beverages may make dehydration worse. While you rehydrate, avoid drinking alcohol. How to tell if you are recovering from dehydration You may be recovering from dehydration if: You are urinating more often than before you started rehydrating. Your urine is pale yellow. Your energy level improves. You vomit less frequently. You have diarrhea less frequently. Your appetite improves or returns to normal. You feel less dizzy or less light-headed.   Your skin tone and color start to look more normal. Follow these instructions at home: Take over-the-counter and prescription medicines only as told by your health care provider. Do not take sodium  tablets. Doing this can lead to having too much sodium in your body (hypernatremia). Contact a health care provider if: You continue to have symptoms of mild or moderate dehydration, such as: Thirst. Dry lips. Slightly dry mouth. Dizziness. Dark urine or less urine than normal. Muscle cramps. You continue to vomit or have diarrhea. Get help right away if you: Have symptoms of dehydration that get worse. Have a fever. Have a severe headache. Have been vomiting and the following happens: Your vomiting gets worse or does not go away. Your vomit includes blood or green matter (bile). You cannot eat or drink without vomiting. Have problems with urination or bowel movements, such as: Diarrhea that gets worse or does not go away. Blood in your stool (feces). This may cause stool to look black and tarry. Not urinating, or urinating only a small amount of very dark urine, within 6-8 hours. Have trouble breathing. Have symptoms that get worse with treatment. These symptoms may represent a serious problem that is an emergency. Do not wait to see if the symptoms will go away. Get medical help right away. Call your local emergency services (911 in the [place] Do not drive yourself to the hospital. Summary Rehydration is the replacement of body fluids and minerals (electrolytes) that are lost during dehydration. Follow instructions from your health care provider for rehydration. The kind of fluid and amount you should drink depend on your condition. Slowly increase how much you drink until you have taken the amount recommended by your health care provider. Contact your health care provider if you continue to show signs of mild or moderate dehydration. This information is not intended to replace advice given to you by your health care provider. Make sure you discuss any questions you have with your health care provider. Document Revised: 10/30/2019 Document Reviewed: 09/09/2019 Elsevier Patient  Education  2023 [employer]  

## 2022-05-05 ENCOUNTER — Ambulatory Visit
Admission: RE | Admit: 2022-05-05 | Discharge: 2022-05-05 | Disposition: A | Discharge status: Home / Self Care | Payer: 59 | Source: Ambulatory Visit | Attending: Radiation Oncology | Admitting: Radiation Oncology

## 2022-05-05 ENCOUNTER — Other Ambulatory Visit: Payer: Self-pay

## 2022-05-05 DIAGNOSIS — C090 Malignant neoplasm of tonsillar fossa: Secondary | ICD-10-CM

## 2022-05-05 DIAGNOSIS — C801 Malignant (primary) neoplasm, unspecified: Secondary | ICD-10-CM | POA: Diagnosis not present

## 2022-05-05 DIAGNOSIS — Z510 Encounter for antineoplastic radiation therapy: Secondary | ICD-10-CM | POA: Diagnosis not present

## 2022-05-05 DIAGNOSIS — C098 Malignant neoplasm of overlapping sites of tonsil: Secondary | ICD-10-CM | POA: Diagnosis not present

## 2022-05-05 DIAGNOSIS — C770 Secondary and unspecified malignant neoplasm of lymph nodes of head, face and neck: Secondary | ICD-10-CM | POA: Diagnosis not present

## 2022-05-05 LAB — RAD ONC ARIA SESSION SUMMARY
Course Elapsed Days: 36
Plan Fractions Treated to Date: 27
Plan Prescribed Dose Per Fraction: 2 Gy
Plan Total Fractions Prescribed: 30
Plan Total Prescribed Dose: 60 Gy
Reference Point Dosage Given to Date: 54 Gy
Reference Point Session Dosage Given: 2 Gy
Session Number: 27

## 2022-05-05 MED ORDER — SONAFINE EX EMUL
1.0000 | Freq: Two times a day (BID) | CUTANEOUS | Status: DC
  Administered 2022-05-05: 1 via TOPICAL

## 2022-05-06 ENCOUNTER — Inpatient Hospital Stay: Payer: 59

## 2022-05-06 ENCOUNTER — Ambulatory Visit
Admission: RE | Admit: 2022-05-06 | Discharge: 2022-05-06 | Disposition: A | Discharge status: Home / Self Care | Payer: 59 | Source: Ambulatory Visit | Attending: Radiation Oncology | Admitting: Radiation Oncology

## 2022-05-06 ENCOUNTER — Other Ambulatory Visit: Payer: Self-pay

## 2022-05-06 ENCOUNTER — Ambulatory Visit (HOSPITAL_COMMUNITY): Payer: 59

## 2022-05-06 ENCOUNTER — Other Ambulatory Visit (HOSPITAL_COMMUNITY): Payer: 59

## 2022-05-06 VITALS — BP 111/67 | HR 69 | Temp 97.8°F | Resp 16

## 2022-05-06 DIAGNOSIS — Z510 Encounter for antineoplastic radiation therapy: Secondary | ICD-10-CM | POA: Diagnosis not present

## 2022-05-06 DIAGNOSIS — C801 Malignant (primary) neoplasm, unspecified: Secondary | ICD-10-CM | POA: Diagnosis not present

## 2022-05-06 DIAGNOSIS — C090 Malignant neoplasm of tonsillar fossa: Secondary | ICD-10-CM

## 2022-05-06 DIAGNOSIS — C098 Malignant neoplasm of overlapping sites of tonsil: Secondary | ICD-10-CM | POA: Diagnosis not present

## 2022-05-06 DIAGNOSIS — C770 Secondary and unspecified malignant neoplasm of lymph nodes of head, face and neck: Secondary | ICD-10-CM | POA: Diagnosis not present

## 2022-05-06 LAB — RAD ONC ARIA SESSION SUMMARY
Course Elapsed Days: 37
Plan Fractions Treated to Date: 28
Plan Prescribed Dose Per Fraction: 2 Gy
Plan Total Fractions Prescribed: 30
Plan Total Prescribed Dose: 60 Gy
Reference Point Dosage Given to Date: 56 Gy
Reference Point Session Dosage Given: 2 Gy
Session Number: 28

## 2022-05-06 MED ORDER — SODIUM CHLORIDE 0.9 % IV SOLN: Freq: Once | INTRAVENOUS | Status: AC

## 2022-05-06 NOTE — Patient Instructions (Signed)
Rehydration, Adult Rehydration is the replacement of body fluids, salts, and minerals (electrolytes) that are lost during dehydration. Dehydration is when there is not enough water or other fluids in the body. This happens when you lose more fluids than you take in. Common causes of dehydration include: Not drinking enough fluids. This can occur when you are ill or doing activities that require a lot of energy, especially in hot weather. Conditions that cause loss of water or other fluids, such as diarrhea, vomiting, sweating, or urinating a lot. Other illnesses, such as fever or infection. Certain medicines, such as those that remove excess fluid from the body (diuretics). Symptoms of mild or moderate dehydration may include thirst, dry lips and mouth, and dizziness. Symptoms of severe dehydration may include increased heart rate, confusion, fainting, and not urinating. For severe dehydration, you may need to get fluids through an IV at the hospital. For mild or moderate dehydration, you can usually rehydrate at home by drinking certain fluids as told by your health care provider. What are the risks? Generally, rehydration is safe. However, taking in too much fluid (overhydration) can be a problem. This is rare. Overhydration can cause an electrolyte imbalance, kidney failure, or a decrease in salt (sodium) levels in the body. Supplies needed You will need an oral rehydration solution (ORS) if your health care provider tells you to use one. This is a drink to treat dehydration. It can be found in pharmacies and retail stores. How to rehydrate Fluids Follow instructions from your health care provider for rehydration. The kind of fluid and the amount you should drink depend on your condition. In general, you should choose drinks that you prefer. If told by your health care provider, drink an ORS. Make an ORS by following instructions on the package. Start by drinking small amounts, about  cup (120  mL) every 5-10 minutes. Slowly increase how much you drink until you have taken the amount recommended by your health care provider. Drink enough clear fluids to keep your urine pale yellow. If you were told to drink an ORS, finish it first, then start slowly drinking other clear fluids. Drink fluids such as: Water. This includes sparkling water and flavored water. Drinking only water can lead to having too little sodium in your body (hyponatremia). Follow the advice of your health care provider. Water from ice chips you suck on. Fruit juice with water you add to it (diluted). Sports drinks. Hot or cold herbal teas. Broth-based soups. Milk or milk products. Food Follow instructions from your health care provider about what to eat while you rehydrate. Your health care provider may recommend that you slowly begin eating regular foods in small amounts. Eat foods that contain a healthy balance of electrolytes, such as bananas, oranges, potatoes, tomatoes, and spinach. Avoid foods that are greasy or contain a lot of sugar. In some cases, you may get nutrition through a feeding tube that is passed through your nose and into your stomach (nasogastric tube, or NG tube). This may be done if you have uncontrolled vomiting or diarrhea. Beverages to avoid  Certain beverages may make dehydration worse. While you rehydrate, avoid drinking alcohol. How to tell if you are recovering from dehydration You may be recovering from dehydration if: You are urinating more often than before you started rehydrating. Your urine is pale yellow. Your energy level improves. You vomit less frequently. You have diarrhea less frequently. Your appetite improves or returns to normal. You feel less dizzy or less light-headed.   Your skin tone and color start to look more normal. Follow these instructions at home: Take over-the-counter and prescription medicines only as told by your health care provider. Do not take sodium  tablets. Doing this can lead to having too much sodium in your body (hypernatremia). Contact a health care provider if: You continue to have symptoms of mild or moderate dehydration, such as: Thirst. Dry lips. Slightly dry mouth. Dizziness. Dark urine or less urine than normal. Muscle cramps. You continue to vomit or have diarrhea. Get help right away if you: Have symptoms of dehydration that get worse. Have a fever. Have a severe headache. Have been vomiting and the following happens: Your vomiting gets worse or does not go away. Your vomit includes blood or green matter (bile). You cannot eat or drink without vomiting. Have problems with urination or bowel movements, such as: Diarrhea that gets worse or does not go away. Blood in your stool (feces). This may cause stool to look black and tarry. Not urinating, or urinating only a small amount of very dark urine, within 6-8 hours. Have trouble breathing. Have symptoms that get worse with treatment. These symptoms may represent a serious problem that is an emergency. Do not wait to see if the symptoms will go away. Get medical help right away. Call your local emergency services (911 in the [place] Do not drive yourself to the hospital. Summary Rehydration is the replacement of body fluids and minerals (electrolytes) that are lost during dehydration. Follow instructions from your health care provider for rehydration. The kind of fluid and amount you should drink depend on your condition. Slowly increase how much you drink until you have taken the amount recommended by your health care provider. Contact your health care provider if you continue to show signs of mild or moderate dehydration. This information is not intended to replace advice given to you by your health care provider. Make sure you discuss any questions you have with your health care provider. Document Revised: 10/30/2019 Document Reviewed: 09/09/2019 Elsevier Patient  Education  2023 [employer]  

## 2022-05-09 ENCOUNTER — Other Ambulatory Visit: Payer: Self-pay

## 2022-05-09 ENCOUNTER — Ambulatory Visit
Admission: RE | Admit: 2022-05-09 | Discharge: 2022-05-09 | Disposition: A | Discharge status: Home / Self Care | Payer: 59 | Source: Ambulatory Visit | Attending: Radiation Oncology | Admitting: Radiation Oncology

## 2022-05-09 ENCOUNTER — Inpatient Hospital Stay: Payer: 59

## 2022-05-09 ENCOUNTER — Ambulatory Visit: Payer: 59

## 2022-05-09 VITALS — BP 124/69 | HR 64 | Resp 14

## 2022-05-09 DIAGNOSIS — Z510 Encounter for antineoplastic radiation therapy: Secondary | ICD-10-CM | POA: Diagnosis not present

## 2022-05-09 DIAGNOSIS — C090 Malignant neoplasm of tonsillar fossa: Secondary | ICD-10-CM

## 2022-05-09 DIAGNOSIS — C098 Malignant neoplasm of overlapping sites of tonsil: Secondary | ICD-10-CM

## 2022-05-09 DIAGNOSIS — C801 Malignant (primary) neoplasm, unspecified: Secondary | ICD-10-CM | POA: Diagnosis not present

## 2022-05-09 DIAGNOSIS — C770 Secondary and unspecified malignant neoplasm of lymph nodes of head, face and neck: Secondary | ICD-10-CM | POA: Diagnosis not present

## 2022-05-09 LAB — BASIC METABOLIC PANEL - CANCER CENTER ONLY
Anion gap: 6 (ref 5–15)
BUN: 32 mg/dL — ABNORMAL HIGH (ref 6–20)
CO2: 28 mmol/L (ref 22–32)
Calcium: 9.6 mg/dL (ref 8.9–10.3)
Chloride: 101 mmol/L (ref 98–111)
Creatinine: 0.66 mg/dL (ref 0.44–1.00)
GFR, Estimated: >60 mL/min (ref >60)
Glucose, Bld: 117 mg/dL — ABNORMAL HIGH (ref 70–99)
Potassium: 3.9 mmol/L (ref 3.5–5.1)
Sodium: 135 mmol/L (ref 135–145)

## 2022-05-09 LAB — RAD ONC ARIA SESSION SUMMARY
Course Elapsed Days: 40
Plan Fractions Treated to Date: 29
Plan Prescribed Dose Per Fraction: 2 Gy
Plan Total Fractions Prescribed: 30
Plan Total Prescribed Dose: 60 Gy
Reference Point Dosage Given to Date: 58 Gy
Reference Point Session Dosage Given: 2 Gy
Session Number: 29

## 2022-05-09 MED ORDER — SODIUM CHLORIDE 0.9 % IV SOLN: Freq: Once | INTRAVENOUS | Status: AC

## 2022-05-09 NOTE — Progress Notes (Signed)
Per Dr. Isidore Moos OK to infuse IVFs at 724m/hr

## 2022-05-09 NOTE — Patient Instructions (Signed)
Rehydration, Adult Rehydration is the replacement of body fluids, salts, and minerals (electrolytes) that are lost during dehydration. Dehydration is when there is not enough water or other fluids in the body. This happens when you lose more fluids than you take in. Common causes of dehydration include: Not drinking enough fluids. This can occur when you are ill or doing activities that require a lot of energy, especially in hot weather. Conditions that cause loss of water or other fluids, such as diarrhea, vomiting, sweating, or urinating a lot. Other illnesses, such as fever or infection. Certain medicines, such as those that remove excess fluid from the body (diuretics). Symptoms of mild or moderate dehydration may include thirst, dry lips and mouth, and dizziness. Symptoms of severe dehydration may include increased heart rate, confusion, fainting, and not urinating. For severe dehydration, you may need to get fluids through an IV at the hospital. For mild or moderate dehydration, you can usually rehydrate at home by drinking certain fluids as told by your health care provider. What are the risks? Generally, rehydration is safe. However, taking in too much fluid (overhydration) can be a problem. This is rare. Overhydration can cause an electrolyte imbalance, kidney failure, or a decrease in salt (sodium) levels in the body. Supplies needed You will need an oral rehydration solution (ORS) if your health care provider tells you to use one. This is a drink to treat dehydration. It can be found in pharmacies and retail stores. How to rehydrate Fluids Follow instructions from your health care provider for rehydration. The kind of fluid and the amount you should drink depend on your condition. In general, you should choose drinks that you prefer. If told by your health care provider, drink an ORS. Make an ORS by following instructions on the package. Start by drinking small amounts, about  cup (120  mL) every 5-10 minutes. Slowly increase how much you drink until you have taken the amount recommended by your health care provider. Drink enough clear fluids to keep your urine pale yellow. If you were told to drink an ORS, finish it first, then start slowly drinking other clear fluids. Drink fluids such as: Water. This includes sparkling water and flavored water. Drinking only water can lead to having too little sodium in your body (hyponatremia). Follow the advice of your health care provider. Water from ice chips you suck on. Fruit juice with water you add to it (diluted). Sports drinks. Hot or cold herbal teas. Broth-based soups. Milk or milk products. Food Follow instructions from your health care provider about what to eat while you rehydrate. Your health care provider may recommend that you slowly begin eating regular foods in small amounts. Eat foods that contain a healthy balance of electrolytes, such as bananas, oranges, potatoes, tomatoes, and spinach. Avoid foods that are greasy or contain a lot of sugar. In some cases, you may get nutrition through a feeding tube that is passed through your nose and into your stomach (nasogastric tube, or NG tube). This may be done if you have uncontrolled vomiting or diarrhea. Beverages to avoid  Certain beverages may make dehydration worse. While you rehydrate, avoid drinking alcohol. How to tell if you are recovering from dehydration You may be recovering from dehydration if: You are urinating more often than before you started rehydrating. Your urine is pale yellow. Your energy level improves. You vomit less frequently. You have diarrhea less frequently. Your appetite improves or returns to normal. You feel less dizzy or less light-headed.   Your skin tone and color start to look more normal. Follow these instructions at home: Take over-the-counter and prescription medicines only as told by your health care provider. Do not take sodium  tablets. Doing this can lead to having too much sodium in your body (hypernatremia). Contact a health care provider if: You continue to have symptoms of mild or moderate dehydration, such as: Thirst. Dry lips. Slightly dry mouth. Dizziness. Dark urine or less urine than normal. Muscle cramps. You continue to vomit or have diarrhea. Get help right away if you: Have symptoms of dehydration that get worse. Have a fever. Have a severe headache. Have been vomiting and the following happens: Your vomiting gets worse or does not go away. Your vomit includes blood or green matter (bile). You cannot eat or drink without vomiting. Have problems with urination or bowel movements, such as: Diarrhea that gets worse or does not go away. Blood in your stool (feces). This may cause stool to look black and tarry. Not urinating, or urinating only a small amount of very dark urine, within 6-8 hours. Have trouble breathing. Have symptoms that get worse with treatment. These symptoms may represent a serious problem that is an emergency. Do not wait to see if the symptoms will go away. Get medical help right away. Call your local emergency services (911 in the [place] Do not drive yourself to the hospital. Summary Rehydration is the replacement of body fluids and minerals (electrolytes) that are lost during dehydration. Follow instructions from your health care provider for rehydration. The kind of fluid and amount you should drink depend on your condition. Slowly increase how much you drink until you have taken the amount recommended by your health care provider. Contact your health care provider if you continue to show signs of mild or moderate dehydration. This information is not intended to replace advice given to you by your health care provider. Make sure you discuss any questions you have with your health care provider. Document Revised: 10/30/2019 Document Reviewed: 09/09/2019 Elsevier Patient  Education  2023 [employer]  

## 2022-05-10 ENCOUNTER — Inpatient Hospital Stay: Payer: 59 | Admitting: Dietician

## 2022-05-10 ENCOUNTER — Other Ambulatory Visit: Payer: Self-pay

## 2022-05-10 ENCOUNTER — Encounter: Payer: Self-pay | Admitting: Radiation Oncology

## 2022-05-10 ENCOUNTER — Ambulatory Visit
Admission: RE | Admit: 2022-05-10 | Discharge: 2022-05-10 | Disposition: A | Discharge status: Home / Self Care | Payer: 59 | Source: Ambulatory Visit | Attending: Radiation Oncology | Admitting: Radiation Oncology

## 2022-05-10 DIAGNOSIS — C801 Malignant (primary) neoplasm, unspecified: Secondary | ICD-10-CM | POA: Diagnosis not present

## 2022-05-10 DIAGNOSIS — C098 Malignant neoplasm of overlapping sites of tonsil: Secondary | ICD-10-CM | POA: Diagnosis not present

## 2022-05-10 DIAGNOSIS — Z510 Encounter for antineoplastic radiation therapy: Secondary | ICD-10-CM | POA: Diagnosis not present

## 2022-05-10 DIAGNOSIS — C770 Secondary and unspecified malignant neoplasm of lymph nodes of head, face and neck: Secondary | ICD-10-CM | POA: Diagnosis not present

## 2022-05-10 LAB — RAD ONC ARIA SESSION SUMMARY
Course Elapsed Days: 41
Plan Fractions Treated to Date: 30
Plan Prescribed Dose Per Fraction: 2 Gy
Plan Total Fractions Prescribed: 30
Plan Total Prescribed Dose: 60 Gy
Reference Point Dosage Given to Date: 60 Gy
Reference Point Session Dosage Given: 2 Gy
Session Number: 30

## 2022-05-10 NOTE — Progress Notes (Signed)
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 She is receiving adjuvant radiation. Final treatment 8/29.   Spoke with patient via telephone. Congratulated her on completed final radiation therapy. Patient is glad to be finished with treatment. She has a cough today. Patient reports taste changes in the last couple of days. Nothing taste like it should and it is weird. Patient recalls trying chocolate pudding which she has liked, but now it taste like dirt. Ensure has become more challenging to drink due to taste. She has found mixing it with chocolate fairlife milk improves the taste some. Patient reports she will be continuing to come for IVF over the next couple of weeks.   Medications: reviewed   Labs: no new labs for review   Anthropometrics: Last weight 121 lb on 8/28   8/14 - 122.2 lb  8/04 - 119.6 lb 7/31 - 121.2 lb  Estimated Energy Needs  Kcals: 1845-2150 Protein: 92-105 Fluid: 2.1 L  NUTRITION DIAGNOSIS: Inadequate oral intake ongoing    INTERVENTION:  Continue working to increase Ensure Plus HP to recommended 6/day Discussed strategies for altered taste, suggested drinking through straw as able Continue baking soda salt water rinses several times daily and before meals     MONITORING, EVALUATION, GOAL: weight trends, intake    NEXT VISIT: Friday 9/1 during IVF

## 2022-05-10 NOTE — Progress Notes (Signed)
Oncology Nurse Navigator Documentation   Met with Jennifer [name] after final RT to offer support and to celebrate end of radiation treatment.   Provided verbal post-RT guidance: Importance of keeping all follow-up appts, especially those with Nutrition and SLP. Importance of protecting treatment area from sun. Continuation of Sonafine application 2-3 times daily, application of antibiotic ointment to areas of raw skin; when supply of Sonafine exhausted transition to OTC lotion with vitamin E. Explained my role as navigator will continue for several more months, encouraged him to call me with needs/concerns.    [name] RN, BSN, OCN Head & Neck Oncology Nurse [place] [facility] Phone # [phone]  Fax # [phone]

## 2022-05-11 ENCOUNTER — Inpatient Hospital Stay: Payer: 59

## 2022-05-11 VITALS — BP 121/71 | HR 68 | Temp 98.1°F | Resp 16

## 2022-05-11 DIAGNOSIS — C090 Malignant neoplasm of tonsillar fossa: Secondary | ICD-10-CM

## 2022-05-11 DIAGNOSIS — C770 Secondary and unspecified malignant neoplasm of lymph nodes of head, face and neck: Secondary | ICD-10-CM | POA: Diagnosis not present

## 2022-05-11 DIAGNOSIS — C801 Malignant (primary) neoplasm, unspecified: Secondary | ICD-10-CM | POA: Diagnosis not present

## 2022-05-11 DIAGNOSIS — Z510 Encounter for antineoplastic radiation therapy: Secondary | ICD-10-CM | POA: Diagnosis not present

## 2022-05-11 MED ORDER — SODIUM CHLORIDE 0.9 % IV SOLN: Freq: Once | INTRAVENOUS | Status: AC

## 2022-05-11 NOTE — Patient Instructions (Signed)
Rehydration, Adult Rehydration is the replacement of body fluids, salts, and minerals (electrolytes) that are lost during dehydration. Dehydration is when there is not enough water or other fluids in the body. This happens when you lose more fluids than you take in. Common causes of dehydration include: Not drinking enough fluids. This can occur when you are ill or doing activities that require a lot of energy, especially in hot weather. Conditions that cause loss of water or other fluids, such as diarrhea, vomiting, sweating, or urinating a lot. Other illnesses, such as fever or infection. Certain medicines, such as those that remove excess fluid from the body (diuretics). Symptoms of mild or moderate dehydration may include thirst, dry lips and mouth, and dizziness. Symptoms of severe dehydration may include increased heart rate, confusion, fainting, and not urinating. For severe dehydration, you may need to get fluids through an IV at the hospital. For mild or moderate dehydration, you can usually rehydrate at home by drinking certain fluids as told by your health care provider. What are the risks? Generally, rehydration is safe. However, taking in too much fluid (overhydration) can be a problem. This is rare. Overhydration can cause an electrolyte imbalance, kidney failure, or a decrease in salt (sodium) levels in the body. Supplies needed You will need an oral rehydration solution (ORS) if your health care provider tells you to use one. This is a drink to treat dehydration. It can be found in pharmacies and retail stores. How to rehydrate Fluids Follow instructions from your health care provider for rehydration. The kind of fluid and the amount you should drink depend on your condition. In general, you should choose drinks that you prefer. If told by your health care provider, drink an ORS. Make an ORS by following instructions on the package. Start by drinking small amounts, about  cup (120  mL) every 5-10 minutes. Slowly increase how much you drink until you have taken the amount recommended by your health care provider. Drink enough clear fluids to keep your urine pale yellow. If you were told to drink an ORS, finish it first, then start slowly drinking other clear fluids. Drink fluids such as: Water. This includes sparkling water and flavored water. Drinking only water can lead to having too little sodium in your body (hyponatremia). Follow the advice of your health care provider. Water from ice chips you suck on. Fruit juice with water you add to it (diluted). Sports drinks. Hot or cold herbal teas. Broth-based soups. Milk or milk products. Food Follow instructions from your health care provider about what to eat while you rehydrate. Your health care provider may recommend that you slowly begin eating regular foods in small amounts. Eat foods that contain a healthy balance of electrolytes, such as bananas, oranges, potatoes, tomatoes, and spinach. Avoid foods that are greasy or contain a lot of sugar. In some cases, you may get nutrition through a feeding tube that is passed through your nose and into your stomach (nasogastric tube, or NG tube). This may be done if you have uncontrolled vomiting or diarrhea. Beverages to avoid  Certain beverages may make dehydration worse. While you rehydrate, avoid drinking alcohol. How to tell if you are recovering from dehydration You may be recovering from dehydration if: You are urinating more often than before you started rehydrating. Your urine is pale yellow. Your energy level improves. You vomit less frequently. You have diarrhea less frequently. Your appetite improves or returns to normal. You feel less dizzy or less light-headed.   Your skin tone and color start to look more normal. Follow these instructions at home: Take over-the-counter and prescription medicines only as told by your health care provider. Do not take sodium  tablets. Doing this can lead to having too much sodium in your body (hypernatremia). Contact a health care provider if: You continue to have symptoms of mild or moderate dehydration, such as: Thirst. Dry lips. Slightly dry mouth. Dizziness. Dark urine or less urine than normal. Muscle cramps. You continue to vomit or have diarrhea. Get help right away if you: Have symptoms of dehydration that get worse. Have a fever. Have a severe headache. Have been vomiting and the following happens: Your vomiting gets worse or does not go away. Your vomit includes blood or green matter (bile). You cannot eat or drink without vomiting. Have problems with urination or bowel movements, such as: Diarrhea that gets worse or does not go away. Blood in your stool (feces). This may cause stool to look black and tarry. Not urinating, or urinating only a small amount of very dark urine, within 6-8 hours. Have trouble breathing. Have symptoms that get worse with treatment. These symptoms may represent a serious problem that is an emergency. Do not wait to see if the symptoms will go away. Get medical help right away. Call your local emergency services (911 in the [place] Do not drive yourself to the hospital. Summary Rehydration is the replacement of body fluids and minerals (electrolytes) that are lost during dehydration. Follow instructions from your health care provider for rehydration. The kind of fluid and amount you should drink depend on your condition. Slowly increase how much you drink until you have taken the amount recommended by your health care provider. Contact your health care provider if you continue to show signs of mild or moderate dehydration. This information is not intended to replace advice given to you by your health care provider. Make sure you discuss any questions you have with your health care provider. Document Revised: 10/30/2019 Document Reviewed: 09/09/2019 Elsevier Patient  Education  2023 [employer]  

## 2022-05-13 ENCOUNTER — Inpatient Hospital Stay: Payer: 59 | Admitting: Dietician

## 2022-05-13 ENCOUNTER — Inpatient Hospital Stay: Payer: 59

## 2022-05-13 VITALS — BP 132/67 | HR 61 | Temp 97.9°F | Resp 16

## 2022-05-13 DIAGNOSIS — C090 Malignant neoplasm of tonsillar fossa: Secondary | ICD-10-CM | POA: Insufficient documentation

## 2022-05-13 DIAGNOSIS — C098 Malignant neoplasm of overlapping sites of tonsil: Secondary | ICD-10-CM | POA: Diagnosis not present

## 2022-05-13 DIAGNOSIS — C770 Secondary and unspecified malignant neoplasm of lymph nodes of head, face and neck: Secondary | ICD-10-CM | POA: Insufficient documentation

## 2022-05-13 MED ORDER — SODIUM CHLORIDE 0.9 % IV SOLN: Freq: Once | INTRAVENOUS | Status: AC

## 2022-05-13 NOTE — Patient Instructions (Signed)
Rehydration, Adult Rehydration is the replacement of body fluids, salts, and minerals (electrolytes) that are lost during dehydration. Dehydration is when there is not enough water or other fluids in the body. This happens when you lose more fluids than you take in. Common causes of dehydration include: Not drinking enough fluids. This can occur when you are ill or doing activities that require a lot of energy, especially in hot weather. Conditions that cause loss of water or other fluids, such as diarrhea, vomiting, sweating, or urinating a lot. Other illnesses, such as fever or infection. Certain medicines, such as those that remove excess fluid from the body (diuretics). Symptoms of mild or moderate dehydration may include thirst, dry lips and mouth, and dizziness. Symptoms of severe dehydration may include increased heart rate, confusion, fainting, and not urinating. For severe dehydration, you may need to get fluids through an IV at the hospital. For mild or moderate dehydration, you can usually rehydrate at home by drinking certain fluids as told by your health care provider. What are the risks? Generally, rehydration is safe. However, taking in too much fluid (overhydration) can be a problem. This is rare. Overhydration can cause an electrolyte imbalance, kidney failure, or a decrease in salt (sodium) levels in the body. Supplies needed You will need an oral rehydration solution (ORS) if your health care provider tells you to use one. This is a drink to treat dehydration. It can be found in pharmacies and retail stores. How to rehydrate Fluids Follow instructions from your health care provider for rehydration. The kind of fluid and the amount you should drink depend on your condition. In general, you should choose drinks that you prefer. If told by your health care provider, drink an ORS. Make an ORS by following instructions on the package. Start by drinking small amounts, about  cup (120  mL) every 5-10 minutes. Slowly increase how much you drink until you have taken the amount recommended by your health care provider. Drink enough clear fluids to keep your urine pale yellow. If you were told to drink an ORS, finish it first, then start slowly drinking other clear fluids. Drink fluids such as: Water. This includes sparkling water and flavored water. Drinking only water can lead to having too little sodium in your body (hyponatremia). Follow the advice of your health care provider. Water from ice chips you suck on. Fruit juice with water you add to it (diluted). Sports drinks. Hot or cold herbal teas. Broth-based soups. Milk or milk products. Food Follow instructions from your health care provider about what to eat while you rehydrate. Your health care provider may recommend that you slowly begin eating regular foods in small amounts. Eat foods that contain a healthy balance of electrolytes, such as bananas, oranges, potatoes, tomatoes, and spinach. Avoid foods that are greasy or contain a lot of sugar. In some cases, you may get nutrition through a feeding tube that is passed through your nose and into your stomach (nasogastric tube, or NG tube). This may be done if you have uncontrolled vomiting or diarrhea. Beverages to avoid  Certain beverages may make dehydration worse. While you rehydrate, avoid drinking alcohol. How to tell if you are recovering from dehydration You may be recovering from dehydration if: You are urinating more often than before you started rehydrating. Your urine is pale yellow. Your energy level improves. You vomit less frequently. You have diarrhea less frequently. Your appetite improves or returns to normal. You feel less dizzy or less light-headed.   Your skin tone and color start to look more normal. Follow these instructions at home: Take over-the-counter and prescription medicines only as told by your health care provider. Do not take sodium  tablets. Doing this can lead to having too much sodium in your body (hypernatremia). Contact a health care provider if: You continue to have symptoms of mild or moderate dehydration, such as: Thirst. Dry lips. Slightly dry mouth. Dizziness. Dark urine or less urine than normal. Muscle cramps. You continue to vomit or have diarrhea. Get help right away if you: Have symptoms of dehydration that get worse. Have a fever. Have a severe headache. Have been vomiting and the following happens: Your vomiting gets worse or does not go away. Your vomit includes blood or green matter (bile). You cannot eat or drink without vomiting. Have problems with urination or bowel movements, such as: Diarrhea that gets worse or does not go away. Blood in your stool (feces). This may cause stool to look black and tarry. Not urinating, or urinating only a small amount of very dark urine, within 6-8 hours. Have trouble breathing. Have symptoms that get worse with treatment. These symptoms may represent a serious problem that is an emergency. Do not wait to see if the symptoms will go away. Get medical help right away. Call your local emergency services (911 in the [place] Do not drive yourself to the hospital. Summary Rehydration is the replacement of body fluids and minerals (electrolytes) that are lost during dehydration. Follow instructions from your health care provider for rehydration. The kind of fluid and amount you should drink depend on your condition. Slowly increase how much you drink until you have taken the amount recommended by your health care provider. Contact your health care provider if you continue to show signs of mild or moderate dehydration. This information is not intended to replace advice given to you by your health care provider. Make sure you discuss any questions you have with your health care provider. Document Revised: 10/30/2019 Document Reviewed: 09/09/2019 Elsevier Patient  Education  2023 [employer]  

## 2022-05-13 NOTE — Progress Notes (Signed)
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 She completed adjuvant radiation 8/29 under the care of Dr. Squire.   Met with patient in infusion. Sister is in town for the weekend and is present for visit. Patient receiving fluids today and glad to be finished with treatment. Patient is having significant taste alterations. It is hard for her to find foods that are palatable. Patient reports chocolate taste like dirt, Ensure has a chemical taste, water is metallic, butter taste like glue. She did eat 1/2 cup of broccoli cheddar soup after sister added sour cream to this. Patient has not been doing baking soda salt water rinses recently. She continues to have pain with swallowing. Patient reports lidocaine rinse worked such a limited amount of time that she stopped using this. She did not like the way Hycet made her feel. Ativan has worked well for nausea as well as stress related to eating orally with current ongoing side effects.    Medications: reviewed  Labs: no new labs for review at this time  Anthropometrics: Last weight 121 lb on 8/28  8/14 - 122.2 lb  8/04 - 119.6 lb 7/31 - 121.2 lb   Estimated Energy Needs   Kcals: 1845-2150 Protein: 92-105 Fluid: 2.1 L   NUTRITION DIAGNOSIS: Inadequate oral intake ongoing   INTERVENTION:  Pt will start doing baking soda salt water rinses several times daily and before meals Discussed strategies for trying different foods/flavors Encouraged pt to try lidocaine rinse and suggested half dose of pain medication  Support and encouragement provided    MONITORING, EVALUATION, GOAL: weight trends, intake    NEXT VISIT: Monday September 11 after MD with Barbara    

## 2022-05-14 ENCOUNTER — Inpatient Hospital Stay: Payer: 59

## 2022-05-14 VITALS — BP 105/53 | HR 62 | Temp 97.8°F | Resp 17

## 2022-05-14 DIAGNOSIS — C090 Malignant neoplasm of tonsillar fossa: Secondary | ICD-10-CM

## 2022-05-14 DIAGNOSIS — C770 Secondary and unspecified malignant neoplasm of lymph nodes of head, face and neck: Secondary | ICD-10-CM | POA: Diagnosis not present

## 2022-05-14 DIAGNOSIS — C098 Malignant neoplasm of overlapping sites of tonsil: Secondary | ICD-10-CM | POA: Diagnosis not present

## 2022-05-14 MED ORDER — SODIUM CHLORIDE 0.9 % IV SOLN: Freq: Once | INTRAVENOUS | Status: AC

## 2022-05-14 NOTE — Patient Instructions (Signed)
Dehydration, Adult Dehydration is a condition in which there is not enough water or other fluids in the body. This happens when a person loses more fluids than he or she takes in. Important organs, such as the kidneys, brain, and heart, cannot function without a proper amount of fluids. Any loss of fluids from the body can lead to dehydration. Dehydration can be mild, moderate, or severe. It should be treated right away to prevent it from becoming severe. What are the causes? Dehydration may be caused by: Conditions that cause loss of water or other fluids, such as diarrhea, vomiting, or sweating or urinating a lot. Not drinking enough fluids, especially when you are ill or doing activities that require a lot of energy. Other illnesses and conditions, such as fever or infection. Certain medicines, such as medicines that remove excess fluid from the body (diuretics). Lack of safe drinking water. Not being able to get enough water and food. What increases the risk? The following factors may make you more likely to develop this condition: Having a long-term (chronic) illness that has not been treated properly, such as diabetes, heart disease, or kidney disease. Being 65 years of age or older. Having a disability. Living in a place that is high in altitude, where thinner, drier air causes more fluid loss. Doing exercises that put stress on your body for a long time (endurance sports). What are the signs or symptoms? Symptoms of dehydration depend on how severe it is. Mild or moderate dehydration Thirst. Dry lips or dry mouth. Dizziness or light-headedness, especially when standing up from a seated position. Muscle cramps. Dark urine. Urine may be the color of tea. Less urine or tears produced than usual. Headache. Severe dehydration Changes in skin. Your skin may be cold and clammy, blotchy, or pale. Your skin also may not return to normal after being lightly pinched and released. Little or  no tears, urine, or sweat. Changes in vital signs, such as rapid breathing and low blood pressure. Your pulse may be weak or may be faster than 100 beats a minute when you are sitting still. Other changes, such as: Feeling very thirsty. Sunken eyes. Cold hands and feet. Confusion. Being very tired (lethargic) or having trouble waking from sleep. Short-term weight loss. Loss of consciousness. How is this diagnosed? This condition is diagnosed based on your symptoms and a physical exam. You may have blood and urine tests to help confirm the diagnosis. How is this treated? Treatment for this condition depends on how severe it is. Treatment should be started right away. Do not wait until dehydration becomes severe. Severe dehydration is an emergency and needs to be treated in a hospital. Mild or moderate dehydration can be treated at home. You may be asked to: Drink more fluids. Drink an oral rehydration solution (ORS). This drink helps restore proper amounts of fluids and salts and minerals in the blood (electrolytes). Severe dehydration can be treated: With IV fluids. By correcting abnormal levels of electrolytes. This is often done by giving electrolytes through a tube that is passed through your nose and into your stomach (nasogastric tube, or NG tube). By treating the underlying cause of dehydration. Follow these instructions at home: Oral rehydration solution If told by your health care provider, drink an ORS: Make an ORS by following instructions on the package. Start by drinking small amounts, about  cup (120 mL) every 5-10 minutes. Slowly increase how much you drink until you have taken the amount recommended by your health   care provider. Eating and drinking        Drink enough clear fluid to keep your urine pale yellow. If you were told to drink an ORS, finish the ORS first and then start slowly drinking other clear fluids. Drink fluids such as: Water. Do not drink only  water. Doing that can lead to hyponatremia, which is having too little salt (sodium) in the body. Water from ice chips you suck on. Fruit juice that you have added water to (diluted fruit juice). Low-calorie sports drinks. Eat foods that contain a healthy balance of electrolytes, such as bananas, oranges, potatoes, tomatoes, and spinach. Do not drink alcohol. Avoid the following: Drinks that contain a lot of sugar. These include high-calorie sports drinks, fruit juice that is not diluted, and soda. Caffeine. Foods that are greasy or contain a lot of fat or sugar. General instructions Take over-the-counter and prescription medicines only as told by your health care provider. Do not take sodium tablets. Doing that can lead to having too much sodium in the body (hypernatremia). Return to your normal activities as told by your health care provider. Ask your health care provider what activities are safe for you. Keep all follow-up visits as told by your health care provider. This is important. Contact a health care provider if: You have muscle cramps, pain, or discomfort, such as: Pain in your abdomen and the pain gets worse or stays in one area (localizes). Stiff neck. You have a rash. You are more irritable than usual. You are sleepier or have a harder time waking than usual. You feel weak or dizzy. You feel very thirsty. Get help right away if you have: Any symptoms of severe dehydration. Symptoms of vomiting, such as: You cannot eat or drink without vomiting. Vomiting gets worse or does not go away. Vomit includes blood or green matter (bile). Symptoms that get worse with treatment. A fever. A severe headache. Problems with urination or bowel movements, such as: Diarrhea that gets worse or does not go away. Blood in your stool (feces). This may cause stool to look black and tarry. Not urinating, or urinating only a small amount of very dark urine, within 6-8 hours. Trouble  breathing. These symptoms may represent a serious problem that is an emergency. Do not wait to see if the symptoms will go away. Get medical help right away. Call your local emergency services (911 in the [place] Do not drive yourself to the hospital. Summary Dehydration is a condition in which there is not enough water or other fluids in the body. This happens when a person loses more fluids than he or she takes in. Treatment for this condition depends on how severe it is. Treatment should be started right away. Do not wait until dehydration becomes severe. Drink enough clear fluid to keep your urine pale yellow. If you were told to drink an oral rehydration solution (ORS), finish the ORS first and then start slowly drinking other clear fluids. Take over-the-counter and prescription medicines only as told by your health care provider. Get help right away if you have any symptoms of severe dehydration. This information is not intended to replace advice given to you by your health care provider. Make sure you discuss any questions you have with your health care provider. Document Revised: 01/05/2022 Document Reviewed: 04/11/2019 Elsevier Patient Education  2023 [employer]  

## 2022-05-18 ENCOUNTER — Inpatient Hospital Stay: Payer: 59

## 2022-05-18 ENCOUNTER — Other Ambulatory Visit: Payer: Self-pay

## 2022-05-18 VITALS — BP 110/60 | HR 68 | Temp 98.0°F | Resp 18

## 2022-05-18 DIAGNOSIS — C090 Malignant neoplasm of tonsillar fossa: Secondary | ICD-10-CM

## 2022-05-18 DIAGNOSIS — C098 Malignant neoplasm of overlapping sites of tonsil: Secondary | ICD-10-CM | POA: Diagnosis not present

## 2022-05-18 DIAGNOSIS — C770 Secondary and unspecified malignant neoplasm of lymph nodes of head, face and neck: Secondary | ICD-10-CM | POA: Diagnosis not present

## 2022-05-18 MED ORDER — SODIUM CHLORIDE 0.9 % IV SOLN: Freq: Once | INTRAVENOUS | Status: AC

## 2022-05-18 NOTE — Patient Instructions (Signed)
Dehydration, Adult Dehydration is a condition in which there is not enough water or other fluids in the body. This happens when a person loses more fluids than he or she takes in. Important organs, such as the kidneys, brain, and heart, cannot function without a proper amount of fluids. Any loss of fluids from the body can lead to dehydration. Dehydration can be mild, moderate, or severe. It should be treated right away to prevent it from becoming severe. What are the causes? Dehydration may be caused by: Conditions that cause loss of water or other fluids, such as diarrhea, vomiting, or sweating or urinating a lot. Not drinking enough fluids, especially when you are ill or doing activities that require a lot of energy. Other illnesses and conditions, such as fever or infection. Certain medicines, such as medicines that remove excess fluid from the body (diuretics). Lack of safe drinking water. Not being able to get enough water and food. What increases the risk? The following factors may make you more likely to develop this condition: Having a long-term (chronic) illness that has not been treated properly, such as diabetes, heart disease, or kidney disease. Being 65 years of age or older. Having a disability. Living in a place that is high in altitude, where thinner, drier air causes more fluid loss. Doing exercises that put stress on your body for a long time (endurance sports). What are the signs or symptoms? Symptoms of dehydration depend on how severe it is. Mild or moderate dehydration Thirst. Dry lips or dry mouth. Dizziness or light-headedness, especially when standing up from a seated position. Muscle cramps. Dark urine. Urine may be the color of tea. Less urine or tears produced than usual. Headache. Severe dehydration Changes in skin. Your skin may be cold and clammy, blotchy, or pale. Your skin also may not return to normal after being lightly pinched and released. Little or  no tears, urine, or sweat. Changes in vital signs, such as rapid breathing and low blood pressure. Your pulse may be weak or may be faster than 100 beats a minute when you are sitting still. Other changes, such as: Feeling very thirsty. Sunken eyes. Cold hands and feet. Confusion. Being very tired (lethargic) or having trouble waking from sleep. Short-term weight loss. Loss of consciousness. How is this diagnosed? This condition is diagnosed based on your symptoms and a physical exam. You may have blood and urine tests to help confirm the diagnosis. How is this treated? Treatment for this condition depends on how severe it is. Treatment should be started right away. Do not wait until dehydration becomes severe. Severe dehydration is an emergency and needs to be treated in a hospital. Mild or moderate dehydration can be treated at home. You may be asked to: Drink more fluids. Drink an oral rehydration solution (ORS). This drink helps restore proper amounts of fluids and salts and minerals in the blood (electrolytes). Severe dehydration can be treated: With IV fluids. By correcting abnormal levels of electrolytes. This is often done by giving electrolytes through a tube that is passed through your nose and into your stomach (nasogastric tube, or NG tube). By treating the underlying cause of dehydration. Follow these instructions at home: Oral rehydration solution If told by your health care provider, drink an ORS: Make an ORS by following instructions on the package. Start by drinking small amounts, about  cup (120 mL) every 5-10 minutes. Slowly increase how much you drink until you have taken the amount recommended by your health   care provider. Eating and drinking        Drink enough clear fluid to keep your urine pale yellow. If you were told to drink an ORS, finish the ORS first and then start slowly drinking other clear fluids. Drink fluids such as: Water. Do not drink only  water. Doing that can lead to hyponatremia, which is having too little salt (sodium) in the body. Water from ice chips you suck on. Fruit juice that you have added water to (diluted fruit juice). Low-calorie sports drinks. Eat foods that contain a healthy balance of electrolytes, such as bananas, oranges, potatoes, tomatoes, and spinach. Do not drink alcohol. Avoid the following: Drinks that contain a lot of sugar. These include high-calorie sports drinks, fruit juice that is not diluted, and soda. Caffeine. Foods that are greasy or contain a lot of fat or sugar. General instructions Take over-the-counter and prescription medicines only as told by your health care provider. Do not take sodium tablets. Doing that can lead to having too much sodium in the body (hypernatremia). Return to your normal activities as told by your health care provider. Ask your health care provider what activities are safe for you. Keep all follow-up visits as told by your health care provider. This is important. Contact a health care provider if: You have muscle cramps, pain, or discomfort, such as: Pain in your abdomen and the pain gets worse or stays in one area (localizes). Stiff neck. You have a rash. You are more irritable than usual. You are sleepier or have a harder time waking than usual. You feel weak or dizzy. You feel very thirsty. Get help right away if you have: Any symptoms of severe dehydration. Symptoms of vomiting, such as: You cannot eat or drink without vomiting. Vomiting gets worse or does not go away. Vomit includes blood or green matter (bile). Symptoms that get worse with treatment. A fever. A severe headache. Problems with urination or bowel movements, such as: Diarrhea that gets worse or does not go away. Blood in your stool (feces). This may cause stool to look black and tarry. Not urinating, or urinating only a small amount of very dark urine, within 6-8 hours. Trouble  breathing. These symptoms may represent a serious problem that is an emergency. Do not wait to see if the symptoms will go away. Get medical help right away. Call your local emergency services (911 in the [place] Do not drive yourself to the hospital. Summary Dehydration is a condition in which there is not enough water or other fluids in the body. This happens when a person loses more fluids than he or she takes in. Treatment for this condition depends on how severe it is. Treatment should be started right away. Do not wait until dehydration becomes severe. Drink enough clear fluid to keep your urine pale yellow. If you were told to drink an oral rehydration solution (ORS), finish the ORS first and then start slowly drinking other clear fluids. Take over-the-counter and prescription medicines only as told by your health care provider. Get help right away if you have any symptoms of severe dehydration. This information is not intended to replace advice given to you by your health care provider. Make sure you discuss any questions you have with your health care provider. Document Revised: 01/05/2022 Document Reviewed: 04/11/2019 Elsevier Patient Education  2023 [employer]  

## 2022-05-20 ENCOUNTER — Inpatient Hospital Stay: Payer: 59

## 2022-05-20 VITALS — BP 117/79 | HR 60 | Temp 97.7°F | Resp 17

## 2022-05-20 DIAGNOSIS — C098 Malignant neoplasm of overlapping sites of tonsil: Secondary | ICD-10-CM | POA: Diagnosis not present

## 2022-05-20 DIAGNOSIS — C090 Malignant neoplasm of tonsillar fossa: Secondary | ICD-10-CM

## 2022-05-20 DIAGNOSIS — C770 Secondary and unspecified malignant neoplasm of lymph nodes of head, face and neck: Secondary | ICD-10-CM | POA: Diagnosis not present

## 2022-05-20 MED ORDER — SODIUM CHLORIDE 0.9 % IV SOLN: Freq: Once | INTRAVENOUS | Status: AC

## 2022-05-20 NOTE — Patient Instructions (Signed)
Rehydration, Adult Rehydration is the replacement of body fluids, salts, and minerals (electrolytes) that are lost during dehydration. Dehydration is when there is not enough water or other fluids in the body. This happens when you lose more fluids than you take in. Common causes of dehydration include: Not drinking enough fluids. This can occur when you are ill or doing activities that require a lot of energy, especially in hot weather. Conditions that cause loss of water or other fluids, such as diarrhea, vomiting, sweating, or urinating a lot. Other illnesses, such as fever or infection. Certain medicines, such as those that remove excess fluid from the body (diuretics). Symptoms of mild or moderate dehydration may include thirst, dry lips and mouth, and dizziness. Symptoms of severe dehydration may include increased heart rate, confusion, fainting, and not urinating. For severe dehydration, you may need to get fluids through an IV at the hospital. For mild or moderate dehydration, you can usually rehydrate at home by drinking certain fluids as told by your health care provider. What are the risks? Generally, rehydration is safe. However, taking in too much fluid (overhydration) can be a problem. This is rare. Overhydration can cause an electrolyte imbalance, kidney failure, or a decrease in salt (sodium) levels in the body. Supplies needed You will need an oral rehydration solution (ORS) if your health care provider tells you to use one. This is a drink to treat dehydration. It can be found in pharmacies and retail stores. How to rehydrate Fluids Follow instructions from your health care provider for rehydration. The kind of fluid and the amount you should drink depend on your condition. In general, you should choose drinks that you prefer. If told by your health care provider, drink an ORS. Make an ORS by following instructions on the package. Start by drinking small amounts, about  cup (120  mL) every 5-10 minutes. Slowly increase how much you drink until you have taken the amount recommended by your health care provider. Drink enough clear fluids to keep your urine pale yellow. If you were told to drink an ORS, finish it first, then start slowly drinking other clear fluids. Drink fluids such as: Water. This includes sparkling water and flavored water. Drinking only water can lead to having too little sodium in your body (hyponatremia). Follow the advice of your health care provider. Water from ice chips you suck on. Fruit juice with water you add to it (diluted). Sports drinks. Hot or cold herbal teas. Broth-based soups. Milk or milk products. Food Follow instructions from your health care provider about what to eat while you rehydrate. Your health care provider may recommend that you slowly begin eating regular foods in small amounts. Eat foods that contain a healthy balance of electrolytes, such as bananas, oranges, potatoes, tomatoes, and spinach. Avoid foods that are greasy or contain a lot of sugar. In some cases, you may get nutrition through a feeding tube that is passed through your nose and into your stomach (nasogastric tube, or NG tube). This may be done if you have uncontrolled vomiting or diarrhea. Beverages to avoid  Certain beverages may make dehydration worse. While you rehydrate, avoid drinking alcohol. How to tell if you are recovering from dehydration You may be recovering from dehydration if: You are urinating more often than before you started rehydrating. Your urine is pale yellow. Your energy level improves. You vomit less frequently. You have diarrhea less frequently. Your appetite improves or returns to normal. You feel less dizzy or less light-headed.   Your skin tone and color start to look more normal. Follow these instructions at home: Take over-the-counter and prescription medicines only as told by your health care provider. Do not take sodium  tablets. Doing this can lead to having too much sodium in your body (hypernatremia). Contact a health care provider if: You continue to have symptoms of mild or moderate dehydration, such as: Thirst. Dry lips. Slightly dry mouth. Dizziness. Dark urine or less urine than normal. Muscle cramps. You continue to vomit or have diarrhea. Get help right away if you: Have symptoms of dehydration that get worse. Have a fever. Have a severe headache. Have been vomiting and the following happens: Your vomiting gets worse or does not go away. Your vomit includes blood or green matter (bile). You cannot eat or drink without vomiting. Have problems with urination or bowel movements, such as: Diarrhea that gets worse or does not go away. Blood in your stool (feces). This may cause stool to look black and tarry. Not urinating, or urinating only a small amount of very dark urine, within 6-8 hours. Have trouble breathing. Have symptoms that get worse with treatment. These symptoms may represent a serious problem that is an emergency. Do not wait to see if the symptoms will go away. Get medical help right away. Call your local emergency services (911 in the [place] Do not drive yourself to the hospital. Summary Rehydration is the replacement of body fluids and minerals (electrolytes) that are lost during dehydration. Follow instructions from your health care provider for rehydration. The kind of fluid and amount you should drink depend on your condition. Slowly increase how much you drink until you have taken the amount recommended by your health care provider. Contact your health care provider if you continue to show signs of mild or moderate dehydration. This information is not intended to replace advice given to you by your health care provider. Make sure you discuss any questions you have with your health care provider. Document Revised: 10/30/2019 Document Reviewed: 09/09/2019 Elsevier Patient  Education  2023 [employer]  

## 2022-05-20 NOTE — Progress Notes (Signed)
Jennifer [name] is here today for a follow-up appointment with Dr. Isidore Moos today Pain issues, if any: Yes in her throat Using a feeding tube?: NA Weight changes, if any: NA Swallowing issues, if any: States that she is having difficulty with swallowing Smoking or chewing tobacco? NA Using fluoride trays daily? NA Last ENT visit was on: June 9th upcoming appointment 06/15/22 Other notable issues, if any: 70 ozs daily Fluids States that her skin is doing better now since radiation Vitals:   05/23/22 1322  BP: 114/73  Pulse: 67  Resp: 17  Temp: 97.8 F (36.6 C)  TempSrc: Oral  SpO2: 100%  Weight: 53.6 kg

## 2022-05-23 ENCOUNTER — Other Ambulatory Visit: Payer: Self-pay

## 2022-05-23 ENCOUNTER — Inpatient Hospital Stay: Payer: 59

## 2022-05-23 ENCOUNTER — Telehealth: Payer: Self-pay | Admitting: *Deleted

## 2022-05-23 ENCOUNTER — Encounter: Payer: 59 | Admitting: Nutrition

## 2022-05-23 ENCOUNTER — Ambulatory Visit
Admission: RE | Admit: 2022-05-23 | Discharge: 2022-05-23 | Disposition: A | Discharge status: Home / Self Care | Payer: 59 | Source: Ambulatory Visit | Attending: Radiation Oncology | Admitting: Radiation Oncology

## 2022-05-23 ENCOUNTER — Encounter: Payer: Self-pay | Admitting: Radiation Oncology

## 2022-05-23 VITALS — BP 118/89 | HR 66

## 2022-05-23 VITALS — BP 114/73 | HR 67 | Temp 97.8°F | Resp 17 | Wt 118.2 lb

## 2022-05-23 DIAGNOSIS — C090 Malignant neoplasm of tonsillar fossa: Secondary | ICD-10-CM | POA: Insufficient documentation

## 2022-05-23 DIAGNOSIS — C098 Malignant neoplasm of overlapping sites of tonsil: Secondary | ICD-10-CM

## 2022-05-23 DIAGNOSIS — C770 Secondary and unspecified malignant neoplasm of lymph nodes of head, face and neck: Secondary | ICD-10-CM | POA: Insufficient documentation

## 2022-05-23 LAB — BASIC METABOLIC PANEL - CANCER CENTER ONLY
Anion gap: 5 (ref 5–15)
BUN: 22 mg/dL — ABNORMAL HIGH (ref 6–20)
CO2: 32 mmol/L (ref 22–32)
Calcium: 9.8 mg/dL (ref 8.9–10.3)
Chloride: 98 mmol/L (ref 98–111)
Creatinine: 0.89 mg/dL (ref 0.44–1.00)
GFR, Estimated: >60 mL/min (ref >60)
Glucose, Bld: 119 mg/dL — ABNORMAL HIGH (ref 70–99)
Potassium: 3.6 mmol/L (ref 3.5–5.1)
Sodium: 135 mmol/L (ref 135–145)

## 2022-05-23 MED ORDER — DEXAMETHASONE 2 MG PO TABS: ORAL_TABLET | ORAL | 0 refills | Status: DC

## 2022-05-23 MED ORDER — LORAZEPAM 0.5 MG PO TABS: ORAL_TABLET | ORAL | 0 refills | Status: DC

## 2022-05-23 MED ORDER — SODIUM CHLORIDE 0.9 % IV SOLN: Freq: Once | INTRAVENOUS | Status: AC

## 2022-05-23 NOTE — Progress Notes (Signed)
Future appointments for fluids cancelled, Dr. Isidore Moos aware

## 2022-05-23 NOTE — Patient Instructions (Signed)
Rehydration, Adult Rehydration is the replacement of body fluids, salts, and minerals (electrolytes) that are lost during dehydration. Dehydration is when there is not enough water or other fluids in the body. This happens when you lose more fluids than you take in. Common causes of dehydration include: Not drinking enough fluids. This can occur when you are ill or doing activities that require a lot of energy, especially in hot weather. Conditions that cause loss of water or other fluids, such as diarrhea, vomiting, sweating, or urinating a lot. Other illnesses, such as fever or infection. Certain medicines, such as those that remove excess fluid from the body (diuretics). Symptoms of mild or moderate dehydration may include thirst, dry lips and mouth, and dizziness. Symptoms of severe dehydration may include increased heart rate, confusion, fainting, and not urinating. For severe dehydration, you may need to get fluids through an IV at the hospital. For mild or moderate dehydration, you can usually rehydrate at home by drinking certain fluids as told by your health care provider. What are the risks? Generally, rehydration is safe. However, taking in too much fluid (overhydration) can be a problem. This is rare. Overhydration can cause an electrolyte imbalance, kidney failure, or a decrease in salt (sodium) levels in the body. Supplies needed You will need an oral rehydration solution (ORS) if your health care provider tells you to use one. This is a drink to treat dehydration. It can be found in pharmacies and retail stores. How to rehydrate Fluids Follow instructions from your health care provider for rehydration. The kind of fluid and the amount you should drink depend on your condition. In general, you should choose drinks that you prefer. If told by your health care provider, drink an ORS. Make an ORS by following instructions on the package. Start by drinking small amounts, about  cup (120  mL) every 5-10 minutes. Slowly increase how much you drink until you have taken the amount recommended by your health care provider. Drink enough clear fluids to keep your urine pale yellow. If you were told to drink an ORS, finish it first, then start slowly drinking other clear fluids. Drink fluids such as: Water. This includes sparkling water and flavored water. Drinking only water can lead to having too little sodium in your body (hyponatremia). Follow the advice of your health care provider. Water from ice chips you suck on. Fruit juice with water you add to it (diluted). Sports drinks. Hot or cold herbal teas. Broth-based soups. Milk or milk products. Food Follow instructions from your health care provider about what to eat while you rehydrate. Your health care provider may recommend that you slowly begin eating regular foods in small amounts. Eat foods that contain a healthy balance of electrolytes, such as bananas, oranges, potatoes, tomatoes, and spinach. Avoid foods that are greasy or contain a lot of sugar. In some cases, you may get nutrition through a feeding tube that is passed through your nose and into your stomach (nasogastric tube, or NG tube). This may be done if you have uncontrolled vomiting or diarrhea. Beverages to avoid  Certain beverages may make dehydration worse. While you rehydrate, avoid drinking alcohol. How to tell if you are recovering from dehydration You may be recovering from dehydration if: You are urinating more often than before you started rehydrating. Your urine is pale yellow. Your energy level improves. You vomit less frequently. You have diarrhea less frequently. Your appetite improves or returns to normal. You feel less dizzy or less light-headed.   Your skin tone and color start to look more normal. Follow these instructions at home: Take over-the-counter and prescription medicines only as told by your health care provider. Do not take sodium  tablets. Doing this can lead to having too much sodium in your body (hypernatremia). Contact a health care provider if: You continue to have symptoms of mild or moderate dehydration, such as: Thirst. Dry lips. Slightly dry mouth. Dizziness. Dark urine or less urine than normal. Muscle cramps. You continue to vomit or have diarrhea. Get help right away if you: Have symptoms of dehydration that get worse. Have a fever. Have a severe headache. Have been vomiting and the following happens: Your vomiting gets worse or does not go away. Your vomit includes blood or green matter (bile). You cannot eat or drink without vomiting. Have problems with urination or bowel movements, such as: Diarrhea that gets worse or does not go away. Blood in your stool (feces). This may cause stool to look black and tarry. Not urinating, or urinating only a small amount of very dark urine, within 6-8 hours. Have trouble breathing. Have symptoms that get worse with treatment. These symptoms may represent a serious problem that is an emergency. Do not wait to see if the symptoms will go away. Get medical help right away. Call your local emergency services (911 in the [place] Do not drive yourself to the hospital. Summary Rehydration is the replacement of body fluids and minerals (electrolytes) that are lost during dehydration. Follow instructions from your health care provider for rehydration. The kind of fluid and amount you should drink depend on your condition. Slowly increase how much you drink until you have taken the amount recommended by your health care provider. Contact your health care provider if you continue to show signs of mild or moderate dehydration. This information is not intended to replace advice given to you by your health care provider. Make sure you discuss any questions you have with your health care provider. Document Revised: 10/30/2019 Document Reviewed: 09/09/2019 Elsevier Patient  Education  2023 [employer]  

## 2022-05-23 NOTE — Telephone Encounter (Signed)
CALLED PATIENT TO ASK ABOUT COMING FOR LABS TODAY, PATIENT AGREED TO COME ON 05-23-22 @ 1 PM

## 2022-05-23 NOTE — Progress Notes (Signed)
Radiation Oncology         [phone]) [phone] ________________________________  Name: Jennifer [name] MRN: [id number]  Date: 05/23/2022  DOB: 1961/02/15  Follow-Up Visit Note  CC: Jennifer [name], [facility], [place]  Jennifer [name], Jennifer [name], Jennifer [name]  Diagnosis and Prior Radiotherapy:       ICD-10-CM   1. 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  Jennifer [name] is here today for a follow-up appointment with Dr. Isidore [name] today Pain issues, if any: Yes in her throat Using a feeding tube?: NA Weight changes, if any: NA Swallowing issues, if any: States that she is having difficulty with swallowing but it's better and she is still tapering dexamethasone as instructed for uvular swelling. Smoking or chewing tobacco? NA Using fluoride trays daily? NA Last ENT visit was on: June 9th upcoming appointment 06/15/22 Other notable issues, if any: 70 ozs daily Fluids States that her skin is doing better now since radiation Vitals:   05/23/22 1322  BP: 114/73  Pulse: 67  Resp: 17  Temp: 97.8 F (36.6 C)  TempSrc: Oral  SpO2: 100%  Weight: 118 lb 4 oz (53.6 kg)                       ALLERGIES:  is allergic to  codeine.  Meds: Current Outpatient Medications  Medication Sig Dispense Refill   calcium elemental as carbonate (BARIATRIC TUMS ULTRA) 400 MG chewable tablet Chew 1,000 mg by mouth daily.     celecoxib (CELEBREX) 200 MG capsule Take 1 capsule by mouth 2 times daily for 30 days. 60 capsule 0   cholecalciferol (VITAMIN D3) 25 MCG (1000 UNIT) tablet Take 1,000 Units by mouth 3 (three) times a week.     dexamethasone (DECADRON) 2 MG tablet Take 2 tablets BID through Aug 31.  On Sept 1, taper to 2 tabs QAM. On Sept 15, taper to 1 tab QAM. On Sept 29, taper to 1/2 tab QAM. Last dose: Oct 13. Take with food. 28 tablet 0   estradiol (ESTRACE) 0.1 MG/GM vaginal cream Apply intravaginally once daily for 2 weeks, then decrease to 1 to 3 times weekly 42.5 g 0   famotidine (PEPCID) 20 MG tablet TAKE 1 TABLET BY MOUTH TWICE DAILY (Patient taking differently: Take 20 mg by mouth at bedtime as needed for heartburn or indigestion.) 180 tablet 1   ferrous sulfate 325 (65 FE) MG tablet Take 325 mg by mouth 3 (three) times a week.      HYDROmorphone HCl (DILAUDID) 1 MG/ML LIQD Take 1-2 mLs (1-2 mg total) by mouth every 4 (four) hours as needed. 75 mL 0   ibuprofen (ADVIL,MOTRIN) 200 MG tablet Take 400 mg by  mouth every 6 (six) hours as needed for mild pain.     lidocaine (XYLOCAINE) 2 % solution Patient: Mix 1part 2% viscous lidocaine, 1part H20. Swish, gargle & swallow 31m of diluted mixture, 371m before meals and at bedtime, up to QID 200 mL 3   LORazepam (ATIVAN) 0.5 MG tablet Take 1-2 tablets by mouth as needed for anxiety or nausea. 30 tablet 0   Misc Natural Products (PROGESTERONE EX) Apply 5 drops topically at bedtime. Bioidentical progesterone compounded solution     omeprazole (PRILOSEC) 20 MG capsule Take 1 capsule daily while on Dexamethasone. 30 capsule 1   ondansetron (ZOFRAN-ODT) 4 MG disintegrating tablet Take 1 tablet by mouth every 8 hours as needed for nausea or vomiting. 30 tablet 0    PRESCRIPTION MEDICATION Apply 3 drops topically daily. Bioidentical estradiol and testosterone compounded solution     rOPINIRole (REQUIP) 0.25 MG tablet Take 1 tablet daily (with the 0.5 mg daily for a total of 0.75 mg daily). 30 tablet 1   rOPINIRole (REQUIP) 0.5 MG tablet Take 1 tablet by mouth daily (take with 0.25 mg tablet to equal 0.75 mg daily) 30 tablet 1   zolpidem (AMBIEN) 5 MG tablet TAKE 1 TABLET BY MOUTH AT BEDTIME AS NEEDED FOR SLEEP 10 tablet 0   No current facility-administered medications for this encounter.    Physical Findings: The patient is in no acute distress. Patient is alert and oriented. Wt Readings from Last 3 Encounters:  05/23/22 118 lb 4 oz (53.6 kg)  03/14/22 123 lb 4 oz (55.9 kg)  01/27/22 129 lb 3.2 oz (58.6 kg)    weight is 118 lb 4 oz (53.6 kg). Her oral temperature is 97.8 F (36.6 C). Her blood pressure is 114/73 and her pulse is 67. Her respiration is 17 and oxygen saturation is 100%. .  General: Alert and oriented, in no acute distress HEENT: Head is normocephalic. Extraocular movements are intact. Oropharynx moist, is notable for resolving mucositis, no thrush; post op anatomical changes in palate (uvula pulled to the left) Neck: Neck is notable for no masses Skin: Skin in treatment fields shows satisfactory healing on neck with some residual erythema.  Extremities: No cyanosis or edema. Lymphatics: see Neck Exam Psychiatric: Judgment and insight are intact. Affect is appropriate.   Lab Findings: Lab Results  Component Value Date   WBC 5.4 03/29/2022   HGB 13.6 03/29/2022   HCT 39.5 03/29/2022   MCV 89.0 03/29/2022   PLT 224 03/29/2022    Lab Results  Component Value Date   TSH 1.247 03/29/2022    Radiographic Findings: No results found.  Impression/Plan:    1) Head and Neck Cancer Status: healing well from post op RT  2) Nutritional Status:  Wt Readings from Last 3 Encounters:  05/23/22 118 lb 4 oz (53.6 kg)  03/14/22 123 lb  4 oz (55.9 kg)  01/27/22 129 lb 3.2 oz (58.6 kg)   Pushing PO intake, weight stabilizing.  PEG tube: none  3) skin care: discussed sunscreen, Vit E cream for healing  4) Swallowing: improving slowly. Continue steroid taper which has helped w/ gagging/palate swelling  5) Dental: Encouraged to continue regular followup with dentistry, and dental hygiene including fluoride rinses.  She will have dentist call me before any extractions in future, if they are recommended for some reason  6) Thyroid function: she will ask PCP to check this annually as she understands she is at higher risk for hypothyroidism. Lab Results  Component Value  Date   TSH 1.247 03/29/2022    7) Other: see ENT in Oct  8) Follow-up after Thanksgiving with CT neck/chest with cont. The patient was encouraged to call with any issues or questions before then.  She would like to stop IV fluids. BUN /Cr ratio is a little elevated, so she knows she will need to push fluids more by mouth for optimal hydration. Last IVF today.  On date of service, in total, I spent 30 minutes on this encounter. Patient was seen in person. _____________________________________   Jennifer Gibson, MD

## 2022-05-24 ENCOUNTER — Encounter: Payer: Self-pay | Admitting: Radiation Oncology

## 2022-05-24 NOTE — Progress Notes (Signed)
                                                                                                                                                             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, face and neck  Cancer Staging:  Cancer Staging  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[place]) Staging form: Pharynx - HPV-Mediated Oropharynx, AJCC 8th Edition - Pathologic stage from 03/14/2022: Stage I (pT1, pN1, cM0, p16+) - Signed by Eppie Gibson, MD on 03/14/2022 Stage prefix: Initial diagnosis  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  Gagging, dysphagia, and palatal /uvular swelling were improved with dexamethasone taper. She avoided a feeding tube and received IV fluids.  Plan: The patient will follow-up with radiation oncology in 2 wks.  -----------------------------------  Eppie Gibson, MD

## 2022-05-24 NOTE — Progress Notes (Signed)
Oncology Nurse Navigator Documentation   I met with Ms. Eastland, her husband, and her daughter before and during her follow up visit with Dr. Isidore Moos on 05/23/22. She is recovering slowly after her radiation for her head and neck cancer. She will return in late November/early December for a post treatment CT scan and follow up visit with Dr. Isidore Moos to receive results. She knows to call me for any needs before then.  [name] RN, BSN, OCN Head & Neck Oncology Nurse [place] at [facility] Phone # [phone]  Fax # [phone]

## 2022-05-25 ENCOUNTER — Inpatient Hospital Stay: Payer: 59

## 2022-05-26 ENCOUNTER — Ambulatory Visit: Payer: Self-pay | Admitting: Physical Therapy

## 2022-05-27 ENCOUNTER — Ambulatory Visit: Payer: 59

## 2022-05-30 ENCOUNTER — Ambulatory Visit: Payer: 59

## 2022-05-30 ENCOUNTER — Telehealth: Payer: Self-pay | Admitting: *Deleted

## 2022-05-30 NOTE — Telephone Encounter (Signed)
Called patient to inform of nutrition appt. being moved to 08-12-22 @ 11:15 am with Samule Ohm, spoke with patient and she agreed to new date and time

## 2022-06-01 ENCOUNTER — Ambulatory Visit: Payer: 59

## 2022-06-03 ENCOUNTER — Ambulatory Visit: Payer: 59

## 2022-06-06 ENCOUNTER — Encounter: Payer: Self-pay | Admitting: *Deleted

## 2022-06-06 ENCOUNTER — Ambulatory Visit: Payer: 59

## 2022-06-08 ENCOUNTER — Ambulatory Visit: Payer: 59

## 2022-06-10 ENCOUNTER — Ambulatory Visit: Payer: 59

## 2022-06-13 ENCOUNTER — Encounter: Payer: Self-pay | Admitting: Physician Assistant

## 2022-06-14 ENCOUNTER — Other Ambulatory Visit: Payer: Self-pay | Admitting: Physician Assistant

## 2022-06-14 DIAGNOSIS — I700 Atherosclerosis of aorta: Secondary | ICD-10-CM

## 2022-06-14 DIAGNOSIS — C099 Malignant neoplasm of tonsil, unspecified: Secondary | ICD-10-CM | POA: Diagnosis not present

## 2022-06-24 ENCOUNTER — Ambulatory Visit: Payer: Self-pay | Admitting: Radiation Oncology

## 2022-06-24 ENCOUNTER — Other Ambulatory Visit (HOSPITAL_COMMUNITY): Payer: 59 | Admitting: Dentistry

## 2022-06-30 ENCOUNTER — Encounter: Payer: Self-pay | Admitting: Physician Assistant

## 2022-06-30 ENCOUNTER — Ambulatory Visit (INDEPENDENT_AMBULATORY_CARE_PROVIDER_SITE_OTHER): Payer: 59 | Admitting: Physician Assistant

## 2022-06-30 ENCOUNTER — Other Ambulatory Visit (HOSPITAL_COMMUNITY): Payer: Self-pay

## 2022-06-30 VITALS — BP 120/80 | HR 88 | Temp 97.5°F | Ht 64.0 in | Wt 119.0 lb

## 2022-06-30 DIAGNOSIS — R21 Rash and other nonspecific skin eruption: Secondary | ICD-10-CM

## 2022-06-30 MED ORDER — HYDROXYZINE HCL 25 MG PO TABS
25.0000 mg | ORAL_TABLET | Freq: Three times a day (TID) | ORAL | 0 refills | Status: DC | PRN
  Filled 2022-06-30: qty 30, 10d supply, fill #0

## 2022-06-30 MED ORDER — PREDNISONE 10 MG PO TABS
ORAL_TABLET | ORAL | 0 refills | Status: AC
  Filled 2022-06-30: qty 21, 14d supply, fill #0

## 2022-06-30 NOTE — Patient Instructions (Signed)
It was great to see you!  May use over the counter antihistamines such as Zyrtec (cetirizine), Claritin (loratadine), Allegra (fexofenadine), or Xyzal (levocetirizine) daily as needed. May take twice a day if needed as long as it does not cause drowsiness.  Start hydroxyzine 12.5 mg (1/2 tablet) up to TID for itching  Pepcid 20-40 mg daily  Steroid 2 week course  Take care,  Inda Coke PA-C

## 2022-06-30 NOTE — Progress Notes (Signed)
Jennifer [name] is a 61 y.o. female here for a new problem.  History of Present Illness:   Chief Complaint  Patient presents with   Rash    Pt c/o rash on arms and legs x 3 days and now on her face. Pt c/o itching, using benadryl oral and cream.    HPI  Rash: She reports that she just got back from her vacation to [place] last night. She complains of bilateral hands and legs itchy rashes which started 3 days ago (06/27/2022), which are now mildly swollen and red. She reports that she noticed rashes on her face since yesterday. She notes that the rashes are only on body parts that were exposed to the sun. She reports she recently stopped taking Dexamethasone last Friday 06/24/2022. She was on this for about two months. She tried Benadryl and Pepsid 20 mg, but it has not helped. She denies any rashes in mouth and throat.   Her last radiation treatment for her cancer in August.   Past Medical History:  Diagnosis Date   Anemia    Bowel obstruction ([place])    Cancer ([place]) 12-16-2021   metastatic squamous cell carcimoma   Fibroids    GERD (gastroesophageal reflux disease)    H/O small bowel obstruction    Hyperlipidemia    no meds, diet controlled   Restless legs syndrome (RLS)      Social History   Tobacco Use   Smoking status: Never   Smokeless tobacco: Never   Tobacco comments:    smoked 30 years ago in college  Vaping Use   Vaping Use: Never used  Substance Use Topics   Alcohol use: Not Currently    Alcohol/week: 3.0 standard drinks of alcohol    Types: 3 Glasses of wine per week    Comment: socially   Drug use: No    Past Surgical History:  Procedure Laterality Date   ABDOMINAL HYSTERECTOMY     DIAGNOSTIC LAPAROSCOPY     x several with lysis of adhesions   EXPLORATORY LAPAROTOMY     had bowel obstruction   EYE SURGERY Bilateral    Lasik   LARYNGOSCOPY AND ESOPHAGOSCOPY N/A 01/07/2022   Procedure: DIRECT LARYNGOSCOPY WITH BIOPSY; ESOPHAGOSCOPY; FROZEN SECTIONS;   Surgeon: [name], MD;  Location: [facility] OR;  Service: ENT;  Laterality: N/A;   ROTATOR CUFF REPAIR Right 2017   [place]  [phone]   [facility] - Dr Adriana Reams    Family History  Problem Relation Age of Onset   Ovarian cancer Mother    Early death Mother    Diabetes Father    Hyperlipidemia Father    Heart disease Father    Cancer - Other Maternal Grandmother        Lymphatic   Breast cancer Paternal Grandmother    COPD Paternal Grandfather    Breast cancer Maternal Aunt     Allergies  Allergen Reactions   Codeine Nausea And Vomiting   Oxycodone Nausea And Vomiting    Current Medications:   Current Outpatient Medications:    calcium elemental as carbonate (BARIATRIC TUMS ULTRA) 400 MG chewable tablet, Chew 1,000 mg by mouth daily., Disp: , Rfl:    cholecalciferol (VITAMIN D3) 25 MCG (1000 UNIT) tablet, Take 1,000 Units by mouth 3 (three) times a week., Disp: , Rfl:    estradiol (ESTRACE) 0.1 MG/GM vaginal cream, Apply intravaginally once daily for 2 weeks, then decrease to 1 to 3 times weekly, Disp: 42.5 g, Rfl:  0   ferrous sulfate 325 (65 FE) MG tablet, Take 325 mg by mouth 3 (three) times a week. , Disp: , Rfl:    hydrOXYzine (ATARAX) 25 MG tablet, Take 1 tablet (25 mg total) by mouth 3 (three) times daily as needed., Disp: 30 tablet, Rfl: 0   ibuprofen (ADVIL,MOTRIN) 200 MG tablet, Take 400 mg by mouth every 6 (six) hours as needed for mild pain., Disp: , Rfl:    LORazepam (ATIVAN) 0.5 MG tablet, Take 1-2 tablets by mouth as needed for anxiety or nausea., Disp: 30 tablet, Rfl: 0   Misc Natural Products (PROGESTERONE EX), Apply 5 drops topically at bedtime. Bioidentical progesterone compounded solution, Disp: , Rfl:    predniSONE (DELTASONE) 10 MG tablet, Take 2 tablets (20 mg total) by mouth daily for 7 days, THEN 1 tablet (10 mg total) daily for 7 days., Disp: 21 tablet, Rfl: 0   PRESCRIPTION MEDICATION, Apply 3 drops topically daily. Bioidentical estradiol  and testosterone compounded solution, Disp: , Rfl:    rOPINIRole (REQUIP) 0.25 MG tablet, Take 1 tablet daily (with the 0.5 mg daily for a total of 0.75 mg daily)., Disp: 30 tablet, Rfl: 1   rOPINIRole (REQUIP) 0.5 MG tablet, Take 1 tablet by mouth daily (take with 0.25 mg tablet to equal 0.75 mg daily), Disp: 30 tablet, Rfl: 1   famotidine (PEPCID) 20 MG tablet, TAKE 1 TABLET BY MOUTH TWICE DAILY (Patient taking differently: Take 20 mg by mouth at bedtime as needed for heartburn or indigestion.), Disp: 180 tablet, Rfl: 1   zolpidem (AMBIEN) 5 MG tablet, TAKE 1 TABLET BY MOUTH AT BEDTIME AS NEEDED FOR SLEEP, Disp: 10 tablet, Rfl: 0   Review of Systems:   Review of Systems  Skin:  Positive for itching and rash (Itchy Rashes on face, bilateral hands, and bilateral legs).    Vitals:   Vitals:   06/30/22 1110  BP: 120/80  Pulse: 88  Temp: (!) 97.5 F (36.4 C)  TempSrc: Temporal  SpO2: 98%  Weight: 119 lb (54 kg)  Height: '5\' 4"'$  (1.626 m)     Body mass index is 20.43 kg/m.  Physical Exam:   Physical Exam Constitutional:      General: She is not in acute distress.    Appearance: Normal appearance. She is not ill-appearing.  HENT:     Head: Normocephalic and atraumatic.     Right Ear: External ear normal.     Left Ear: External ear normal.  Eyes:     Extraocular Movements: Extraocular movements intact.     Pupils: Pupils are equal, round, and reactive to light.  Skin:    General: Skin is warm and dry.     Comments: Diffuse erythematous maculo-papular rash on b/l arms and legs; no warmth or discharge/weeping  Neurological:     Mental Status: She is alert and oriented to person, place, and time.  Psychiatric:        Judgment: Judgment normal.     Assessment and Plan:   Rash and nonspecific skin eruption Unclear etiology Ddx includes contact dermatitis, photosensitivity, allergic response Recommend 20 mg prednisone daily x 1 week then 10 mg prednisone daily x 1  week Daily antihistamine of choice and pepcid If lack of improvement, will consider labs +/- punch biopsy or dermatology referral Worsening precautions advised  I,Param Shah,acting as a scribe for Inda Coke, PA.,have documented all relevant documentation on the behalf of Inda Coke, PA,as directed by  Inda Coke, PA while in the  presence of Inda Coke, [place].  I, Inda Coke, [place], have reviewed all documentation for this visit. The documentation on 06/30/22 for the exam, diagnosis, procedures, and orders are all accurate and complete.  Inda Coke, PA-C

## 2022-07-08 ENCOUNTER — Ambulatory Visit
Admission: RE | Admit: 2022-07-08 | Discharge: 2022-07-08 | Disposition: A | Discharge status: Home / Self Care | Payer: 59 | Source: Ambulatory Visit | Attending: Radiation Oncology | Admitting: Radiation Oncology

## 2022-07-08 ENCOUNTER — Encounter: Payer: Self-pay | Admitting: Radiation Oncology

## 2022-07-08 VITALS — BP 122/78 | HR 73 | Temp 97.3°F | Resp 18 | Ht 64.0 in | Wt 118.6 lb

## 2022-07-08 DIAGNOSIS — Z7952 Long term (current) use of systemic steroids: Secondary | ICD-10-CM | POA: Insufficient documentation

## 2022-07-08 DIAGNOSIS — Z79890 Hormone replacement therapy (postmenopausal): Secondary | ICD-10-CM | POA: Diagnosis not present

## 2022-07-08 DIAGNOSIS — Z923 Personal history of irradiation: Secondary | ICD-10-CM | POA: Insufficient documentation

## 2022-07-08 DIAGNOSIS — C770 Secondary and unspecified malignant neoplasm of lymph nodes of head, face and neck: Secondary | ICD-10-CM | POA: Insufficient documentation

## 2022-07-08 DIAGNOSIS — C090 Malignant neoplasm of tonsillar fossa: Secondary | ICD-10-CM | POA: Insufficient documentation

## 2022-07-08 NOTE — Progress Notes (Signed)
Radiation Oncology         [phone]) [phone] ________________________________  Name: Jennifer [name] MRN: [id number]  Date: 07/08/2022  DOB: 07/16/1961  Follow-Up Visit Note  CC: Inda Coke, PA  [place], [place], [place]  Diagnosis and Prior Radiotherapy:       ICD-10-CM   1. 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 [facility]) Staging form: Pharynx - HPV-Mediated Oropharynx, AJCC 8th Edition - Clinical stage from 01/03/2022: Stage I (cT0, cN1, cM0, p16+) - Signed by Eppie Gibson, MD on 01/04/2022 Stage prefix: Initial diagnosis  Intent: Curative  Radiation Treatment Dates: 03/30/2022 through 05/10/2022 Site Technique Total Dose (Gy) Dose per Fx (Gy) Completed Fx Beam Energies  Neck: HN_Ltonsil IMRT 60/60 2 30/30 6X    CHIEF COMPLAINT:  Here for follow-up and surveillance of tonsil cancer   NARRATIVE: The patient asked to be seen today earlier than scheduled due to concerns of worsening pain in the left posterior underbelly of her tongue   Pain issues, if any: yes, pain under tongue at surgery side in mouth, makes talking and eating difficult although she is still able to do both  Using a feeding tube?: na Weight changes, if any: Mild if any Wt Readings from Last 3 Encounters:  07/08/22 118 lb 9.6 oz (53.8 kg)  06/30/22 119 lb (54 kg)  05/23/22 118 lb 4 oz (53.6 kg)    Swallowing issues, if any: yes, inflammation in throat is making it difficult, it is getting better however Smoking or chewing tobacco? No Using fluoride trays daily? No, uses biotene, fells like it helps temporarily Last ENT visit was on: Oct. 3rd Other notable issues, if  any: none other than above  She saw ENT we simply and was reassured about her symptoms but the pain has gotten worse in the left underbelly of her tongue since   No new swelling or masses in the neck or mouth   ALLERGIES:  is allergic to codeine and oxycodone.  Meds: Current Outpatient Medications  Medication Sig Dispense Refill   calcium elemental as carbonate (BARIATRIC TUMS ULTRA) 400 MG chewable tablet Chew 1,000 mg by mouth daily.     cholecalciferol (VITAMIN D3) 25 MCG (1000 UNIT) tablet Take 1,000 Units by mouth 3 (three) times a week.     estradiol (ESTRACE) 0.1 MG/GM vaginal cream Apply intravaginally once daily for 2 weeks, then decrease to 1 to 3 times weekly 42.5 g 0   famotidine (PEPCID) 20 MG tablet TAKE 1 TABLET BY MOUTH TWICE DAILY (Patient taking differently: Take 20 mg by mouth at bedtime as needed for heartburn or indigestion.) 180 tablet 1   ferrous sulfate 325 (65 FE) MG tablet Take 325 mg by mouth 3 (three) times a week.      hydrOXYzine (ATARAX) 25 MG tablet Take 1 tablet (25 mg total) by mouth 3 (three) times daily as needed. 30 tablet 0   ibuprofen (ADVIL,MOTRIN) 200 MG tablet Take 400 mg by mouth every 6 (six) hours as needed for mild pain.     LORazepam (ATIVAN) 0.5 MG tablet Take 1-2 tablets by mouth as needed for anxiety or nausea. 30 tablet 0   Misc  Natural Products (PROGESTERONE EX) Apply 5 drops topically at bedtime. Bioidentical progesterone compounded solution     predniSONE (DELTASONE) 10 MG tablet Take 2 tablets (20 mg total) by mouth daily for 7 days, THEN 1 tablet (10 mg total) daily for 7 days. 21 tablet 0   PRESCRIPTION MEDICATION Apply 3 drops topically daily. Bioidentical estradiol and testosterone compounded solution     rOPINIRole (REQUIP) 0.25 MG tablet Take 1 tablet daily (with the 0.5 mg daily for a total of 0.75 mg daily). 30 tablet 1   rOPINIRole (REQUIP) 0.5 MG tablet Take 1 tablet by mouth daily (take with 0.25 mg tablet to equal 0.75 mg  daily) 30 tablet 1   zolpidem (AMBIEN) 5 MG tablet TAKE 1 TABLET BY MOUTH AT BEDTIME AS NEEDED FOR SLEEP 10 tablet 0   No current facility-administered medications for this encounter.    Physical Findings: The patient is in no acute distress. Patient is alert and oriented. Wt Readings from Last 3 Encounters:  07/08/22 118 lb 9.6 oz (53.8 kg)  06/30/22 119 lb (54 kg)  05/23/22 118 lb 4 oz (53.6 kg)    height is '5\' 4"'$  (1.626 m) and weight is 118 lb 9.6 oz (53.8 kg). Her temperature is 97.3 F (36.3 C) (abnormal). Her blood pressure is 122/78 and her pulse is 73. Her respiration is 18 and oxygen saturation is 98%. .  General: Alert and oriented, in no acute distress HEENT: Head is normocephalic. Extraocular movements are intact. Oropharynx moist, is notable for resolving mucositis in the left tonsillar fossa, no thrush; post op anatomical changes in palate (uvula pulled to the left though this looks like it is improving).  The left posterior floor of mouth and left posterior underbelly of the tongue where she is hurting show no ulceration, nodularity, or mass. Neck: Neck is notable for no masses Skin: Skin in treatment fields shows satisfactory healing on neck with some residual erythema.  Extremities: No cyanosis or edema. Lymphatics: see Neck Exam Psychiatric: Judgment and insight are intact. Affect is appropriate.   Lab Findings: Lab Results  Component Value Date   WBC 5.4 03/29/2022   HGB 13.6 03/29/2022   HCT 39.5 03/29/2022   MCV 89.0 03/29/2022   PLT 224 03/29/2022    Lab Results  Component Value Date   TSH 1.247 03/29/2022    Radiographic Findings: No results found.  Impression/Plan:    1) Head and Neck Cancer Status: healing well from post op RT; she does have some worsening pain in the left posterior underbelly of her tongue and floor of mouth.  Exam is very reassuring.  I talked to her about the option of getting a CT scan sooner rather than later but it probably  will not explain her pain.  The concern for cancer in this area is very low.  I suspect this is a side effect of her radiation and surgery and possibly a function of increased chewing, eating solid foods, and talking.  If it gets even worse in a couple weeks I think it is reasonable for her to call ENT again  2) Nutritional Status:  Wt Readings from Last 3 Encounters:  07/08/22 118 lb 9.6 oz (53.8 kg)  06/30/22 119 lb (54 kg)  05/23/22 118 lb 4 oz (53.6 kg)   Pushing PO intake, weight stabilizing.  PEG tube: none  3) skin care: discussed sunscreen, Vit E cream for healing  4) Swallowing: Good function now.  Continue speech-language pathology exercises  5)  Dental: Encouraged to continue regular followup with dentistry, and dental hygiene including fluoride rinses.  She will have dentist call me before any extractions in future, if they are recommended for some reason  6) Thyroid function: she will ask PCP to check this annually as she understands she is at higher risk for hypothyroidism. Lab Results  Component Value Date   TSH 1.247 03/29/2022    7) Other:  Follow-up after Thanksgiving with CT neck/chest with cont. The patient was encouraged to call with any issues or questions before then.   On date of service, in total, I spent 30 minutes on this encounter. Patient was seen in person. _____________________________________   Eppie Gibson, MD

## 2022-07-08 NOTE — Progress Notes (Addendum)
Jennifer [name] is here for follow-up since completing radiation to her tonsil on 05-10-22. Pain issues, if any: yes, pain under tongue at surgery side in mouth, makes talking and eating difficult Using a feeding tube?: na Weight changes, if any: yes, a little bit per pt report Wt Readings from Last 3 Encounters:  07/08/22 118 lb 9.6 oz (53.8 kg)  06/30/22 119 lb (54 kg)  05/23/22 118 lb 4 oz (53.6 kg)    Swallowing issues, if any: yes, inflammation in throat is making it difficult, it is getting better however Smoking or chewing tobacco? No Using fluoride trays daily? No, uses biotene, fells like it helps temporarily Last ENT visit was on: Oct. 3rd Other notable issues, if any: none other than above

## 2022-07-15 ENCOUNTER — Encounter: Payer: Self-pay | Admitting: Dietician

## 2022-07-15 NOTE — Progress Notes (Signed)
Provided one complimentary case Ensure Plus HP.

## 2022-07-18 ENCOUNTER — Encounter: Payer: Self-pay | Admitting: Physician Assistant

## 2022-07-19 ENCOUNTER — Other Ambulatory Visit (INDEPENDENT_AMBULATORY_CARE_PROVIDER_SITE_OTHER): Payer: 59

## 2022-07-19 ENCOUNTER — Other Ambulatory Visit: Payer: Self-pay | Admitting: Internal Medicine

## 2022-07-19 DIAGNOSIS — C099 Malignant neoplasm of tonsil, unspecified: Secondary | ICD-10-CM

## 2022-07-19 DIAGNOSIS — R5383 Other fatigue: Secondary | ICD-10-CM | POA: Diagnosis not present

## 2022-07-19 DIAGNOSIS — R5381 Other malaise: Secondary | ICD-10-CM | POA: Diagnosis not present

## 2022-07-19 LAB — TSH: TSH: 1.26 u[IU]/mL (ref 0.35–5.50)

## 2022-07-19 LAB — CORTISOL: Cortisol, Plasma: 6.9 ug/dL

## 2022-07-19 LAB — CBC WITH DIFFERENTIAL/PLATELET
Basophils Absolute: 0.1 10*3/uL (ref 0.0–0.1)
Basophils Relative: 0.9 % (ref 0.0–3.0)
Eosinophils Absolute: 0.1 10*3/uL (ref 0.0–0.7)
Eosinophils Relative: 1.3 % (ref 0.0–5.0)
HCT: 45.5 % (ref 36.0–46.0)
Hemoglobin: 15.3 g/dL — ABNORMAL HIGH (ref 12.0–15.0)
Lymphocytes Relative: 9.8 % — ABNORMAL LOW (ref 12.0–46.0)
Lymphs Abs: 0.7 10*3/uL (ref 0.7–4.0)
MCHC: 33.7 g/dL (ref 30.0–36.0)
MCV: 91.3 fl (ref 78.0–100.0)
Monocytes Absolute: 0.8 10*3/uL (ref 0.1–1.0)
Monocytes Relative: 11.7 % (ref 3.0–12.0)
Neutro Abs: 5.1 10*3/uL (ref 1.4–7.7)
Neutrophils Relative %: 76.3 % (ref 43.0–77.0)
Platelets: 242 10*3/uL (ref 150.0–400.0)
RBC: 4.98 Mil/uL (ref 3.87–5.11)
RDW: 13.4 % (ref 11.5–15.5)
WBC: 6.7 10*3/uL (ref 4.0–10.5)

## 2022-07-19 LAB — COMPREHENSIVE METABOLIC PANEL
ALT: 11 U/L (ref 0–35)
AST: 15 U/L (ref 0–37)
Albumin: 4.1 g/dL (ref 3.5–5.2)
Alkaline Phosphatase: 44 U/L (ref 39–117)
BUN: 16 mg/dL (ref 6–23)
CO2: 31 mEq/L (ref 19–32)
Calcium: 9.9 mg/dL (ref 8.4–10.5)
Chloride: 103 mEq/L (ref 96–112)
Creatinine, Ser: 0.78 mg/dL (ref 0.40–1.20)
GFR: 83.41 mL/min (ref >60.00)
Glucose, Bld: 86 mg/dL (ref 70–99)
Potassium: 4.3 mEq/L (ref 3.5–5.1)
Sodium: 141 mEq/L (ref 135–145)
Total Bilirubin: 0.4 mg/dL (ref 0.2–1.2)
Total Protein: 6.5 g/dL (ref 6.0–8.3)

## 2022-07-19 LAB — VITAMIN B12: Vitamin B-12: 536 pg/mL (ref 211–911)

## 2022-07-21 ENCOUNTER — Ambulatory Visit: Payer: 59 | Admitting: Physician Assistant

## 2022-07-22 ENCOUNTER — Telehealth: Payer: Self-pay | Admitting: *Deleted

## 2022-07-22 ENCOUNTER — Ambulatory Visit (HOSPITAL_BASED_OUTPATIENT_CLINIC_OR_DEPARTMENT_OTHER)
Admission: RE | Admit: 2022-07-22 | Discharge: 2022-07-22 | Disposition: A | Discharge status: Home / Self Care | Payer: 59 | Source: Ambulatory Visit | Attending: Physician Assistant | Admitting: Physician Assistant

## 2022-07-22 DIAGNOSIS — I700 Atherosclerosis of aorta: Secondary | ICD-10-CM | POA: Insufficient documentation

## 2022-07-22 NOTE — Telephone Encounter (Signed)
CALLED PATIENT TO INFORM OF CT FOR 08-08-22- ARRIVAL TIME- 1:45 PM @ [facility], PATIENT TO HAVE WATER ONLY - 4 HRS. PRIOR TO TEST, PATIENT TO  RECEIVE RESULTS FROM DR. SQUIRE ON 08-12-22 @ 10:20 AM, SPOKE WITH PATIENT AND SHE IS AWARE OF THESE APPTS. AND THE INSTRUCTIONS

## 2022-07-25 ENCOUNTER — Other Ambulatory Visit (HOSPITAL_COMMUNITY): Payer: Self-pay

## 2022-07-25 DIAGNOSIS — H00011 Hordeolum externum right upper eyelid: Secondary | ICD-10-CM | POA: Diagnosis not present

## 2022-07-25 MED ORDER — OFLOXACIN 0.3 % OP SOLN
1.0000 [drp] | OPHTHALMIC | 0 refills | Status: DC
  Filled 2022-07-25: qty 5, 9d supply, fill #0

## 2022-07-26 ENCOUNTER — Encounter: Payer: Self-pay | Admitting: Physician Assistant

## 2022-07-26 DIAGNOSIS — R931 Abnormal findings on diagnostic imaging of heart and coronary circulation: Secondary | ICD-10-CM

## 2022-08-03 NOTE — Progress Notes (Incomplete)
Ms. 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 She completed treatment on 05-10-22.   Pain issues, if any: *** Using a feeding tube?: *** Weight changes, if any: *** Swallowing issues, if any: *** Smoking or chewing tobacco? *** Using fluoride trays daily? *** Last ENT visit was on: *** Other notable issues, if any: ***

## 2022-08-08 ENCOUNTER — Ambulatory Visit (HOSPITAL_COMMUNITY): Payer: 59

## 2022-08-08 ENCOUNTER — Other Ambulatory Visit (HOSPITAL_COMMUNITY): Payer: Self-pay

## 2022-08-08 ENCOUNTER — Telehealth: Payer: Self-pay | Admitting: *Deleted

## 2022-08-08 ENCOUNTER — Encounter: Payer: Self-pay | Admitting: Internal Medicine

## 2022-08-08 ENCOUNTER — Ambulatory Visit (INDEPENDENT_AMBULATORY_CARE_PROVIDER_SITE_OTHER): Payer: 59 | Admitting: Internal Medicine

## 2022-08-08 VITALS — BP 94/68 | HR 77 | Ht 64.0 in | Wt 118.6 lb

## 2022-08-08 DIAGNOSIS — R931 Abnormal findings on diagnostic imaging of heart and coronary circulation: Secondary | ICD-10-CM | POA: Diagnosis not present

## 2022-08-08 MED ORDER — ATORVASTATIN CALCIUM 20 MG PO TABS
20.0000 mg | ORAL_TABLET | Freq: Every day | ORAL | 3 refills | Status: DC
  Filled 2022-08-08: qty 90, 90d supply, fill #0

## 2022-08-08 NOTE — Patient Instructions (Signed)
Medication Instructions:  START: ATORVASTATIN '20mg'$  ONCE DAILY  *If you need a refill on your cardiac medications before your next appointment, please call your pharmacy*  Lab Work: Please return for Blood Work in [place]. No appointment needed, lab here at the office is open Monday-Friday from 8AM to 4PM and closed daily for lunch from 12:45-1:45.   If you have labs (blood work) drawn today and your tests are completely normal, you will receive your results only by: [place] (if you have MyChart) OR A paper copy in the mail If you have any lab test that is abnormal or we need to change your treatment, we will call you to review the results.  Testing/Procedures: None Ordered At This Time.   Follow-Up: At [facility], you and your health needs are our priority.  As part of our continuing mission to provide you with exceptional heart care, we have created designated Provider Care Teams.  These Care Teams include your primary Cardiologist (physician) and Advanced Practice Providers (APPs -  Physician Assistants and Nurse Practitioners) who all work together to provide you with the care you need, when you need it.  We recommend signing up for the patient portal called "MyChart".  Sign up information is provided on this After Visit Summary.  MyChart is used to connect with patients for Virtual Visits (Telemedicine).  Patients are able to view lab/test results, encounter notes, upcoming appointments, etc.  Non-urgent messages can be sent to your provider as well.   To learn more about what you can do with MyChart, go to [email or web address].    Your next appointment:   AS NEEDED   The format for your next appointment:   In Person  Provider:   Janina Mayo, MD

## 2022-08-08 NOTE — Telephone Encounter (Signed)
RETURNED PATIENT'S PHONE CALL, SPOKE WITH PATIENT. ?

## 2022-08-08 NOTE — Progress Notes (Signed)
Cardiology Office Note:    Date:  08/08/2022   ID:  Jennifer [name], DOB 1961-06-29, MRN [id number]  PCP:  Inda Coke, [place] [facility] Cardiologist:  Janina Mayo, MD     Referring MD: Inda Coke, [place]   No chief complaint on file. Elevated CAC percentile  History of Present Illness:    Jennifer [name] is a 61 y.o. 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 She's had surveillance scans for cancer and noted to have atherosclerosis.  04/20/2020 TC 224 mg/dL HDL 78 mg/dL LDL 133 mg/dL  Past Medical History:  Diagnosis Date   Anemia    Bowel obstruction (HCC)    Cancer ([place]) 12-16-2021   metastatic squamous cell carcimoma   Fibroids    GERD (gastroesophageal reflux disease)    H/O small bowel obstruction    Hyperlipidemia    no meds, diet controlled   Restless legs syndrome (RLS)     Past Surgical History:  Procedure Laterality Date   ABDOMINAL HYSTERECTOMY     DIAGNOSTIC LAPAROSCOPY     x several with lysis of adhesions   EXPLORATORY LAPAROTOMY     had bowel obstruction   EYE SURGERY Bilateral    Lasik   LARYNGOSCOPY AND ESOPHAGOSCOPY N/A 01/07/2022   Procedure: DIRECT LARYNGOSCOPY WITH BIOPSY; ESOPHAGOSCOPY; FROZEN SECTIONS;  Surgeon: [name], MD;  Location: [place];  Service: ENT;  Laterality: N/A;   ROTATOR CUFF REPAIR Right 2017   SMALL INTESTINE SURGERY  [id number]   [facility] - Dr Adriana Reams    Current Medications: Current Meds  Medication Sig   atorvastatin (LIPITOR) 20 MG tablet Take 1 tablet (20 mg total) by mouth daily.   calcium elemental as carbonate (BARIATRIC TUMS ULTRA) 400 MG chewable tablet Chew 1,000 mg by mouth daily.   cholecalciferol (VITAMIN D3) 25 MCG (1000 UNIT) tablet Take 1,000 Units by mouth 3 (three) times a week.   estradiol (ESTRACE) 0.1 MG/GM vaginal cream Apply  intravaginally once daily for 2 weeks, then decrease to 1 to 3 times weekly   ferrous sulfate 325 (65 FE) MG tablet Take 325 mg by mouth 3 (three) times a week.    ibuprofen (ADVIL,MOTRIN) 200 MG tablet Take 400 mg by mouth every 6 (six) hours as needed for mild pain.   Misc Natural Products (PROGESTERONE EX) Apply 5 drops topically at bedtime. Bioidentical progesterone compounded solution   PRESCRIPTION MEDICATION Apply 3 drops topically daily. Bioidentical estradiol and testosterone compounded solution   rOPINIRole (REQUIP) 0.25 MG tablet Take 1 tablet daily (with the 0.5 mg daily for a total of 0.75 mg daily).   rOPINIRole (REQUIP) 0.5 MG tablet Take 1 tablet by mouth daily (take with 0.25 mg tablet to equal 0.75 mg daily)     Allergies:   Codeine and Oxycodone   Social History   Socioeconomic History   Marital status: Married    Spouse name: Not on file   Number of children: 3   Years of education: Not on file   Highest education level: Not on file  Occupational History   Occupation: [employer]    Employer: [employer]  Tobacco Use   Smoking status: Never   Smokeless tobacco: Never   Tobacco comments:    smoked 30 years ago in college  Vaping Use   Vaping Use: Never  used  Substance and Sexual Activity   Alcohol use: Not Currently    Alcohol/week: 3.0 standard drinks of alcohol    Types: 3 Glasses of wine per week    Comment: socially   Drug use: No   Sexual activity: Yes    Partners: Male    Birth control/protection: None, Surgical    Comment: Hysterectomy  Other Topics Concern   Not on file  Social History Narrative   CMA with [employer]   Social Determinants of Health   Financial Resource Strain: Not on file  Food Insecurity: No Food Insecurity (01/20/2022)   Hunger Vital Sign    Worried About Running Out of Food in the Last Year: Never true    Ran Out of Food in the Last Year: Never true  Transportation Needs: No Transportation Needs (01/20/2022)    PRAPARE - Hydrologist (Medical): No    Lack of Transportation (Non-Medical): No  Physical Activity: Not on file  Stress: Not on file  Social Connections: Not on file     Family History: The patient's family history includes Breast cancer in her maternal aunt and paternal grandmother; COPD in her paternal grandfather; Cancer - Other in her maternal grandmother; Diabetes in her father; Early death in her mother; Heart disease in her father; Hyperlipidemia in her father; Ovarian cancer in her mother.  ROS:   Please see the history of present illness.     All other systems reviewed and are negative.  EKGs/Labs/Other Studies Reviewed:    The following studies were reviewed today:   EKG:  EKG is  ordered today.  The ekg ordered today demonstrates   08/08/2022- NSR   Recent Labs: 07/19/2022: ALT 11; BUN 16; Creatinine, Ser 0.78; Hemoglobin 15.3; Platelets 242.0; Potassium 4.3; Sodium 141; TSH 1.26   Recent Lipid Panel    Component Value Date/Time   CHOL 224 (H) 04/20/2020 1125   TRIG 63.0 04/20/2020 1125   HDL 78.80 04/20/2020 1125   CHOLHDL 3 04/20/2020 1125   VLDL 12.6 04/20/2020 1125   LDLCALC 133 (H) 04/20/2020 1125     Risk Assessment/Calculations:     Physical Exam:    VS:   Vitals:   08/08/22 1331  BP: 94/68  Pulse: 77  SpO2: 99%     Wt Readings from Last 3 Encounters:  08/08/22 118 lb 9.6 oz (53.8 kg)  07/08/22 118 lb 9.6 oz (53.8 kg)  06/30/22 119 lb (54 kg)     GEN:  Well nourished, well developed in no acute distress HEENT: Normal NECK: No JVD; No carotid bruits LYMPHATICS: No lymphadenopathy CARDIAC: RRR, no murmurs, rubs, gallops RESPIRATORY:  Clear to auscultation without rales, wheezing or rhonchi  ABDOMEN: Soft, non-tender, non-distended MUSCULOSKELETAL:  No edema; No deformity  SKIN: Warm and dry NEUROLOGIC:  Alert and oriented x 3 PSYCHIATRIC:  Normal affect   ASSESSMENT:    CAC: 87th percentile, however  CAC score is quite low. She has no other significant risk factors for CVD. Will recommend statin therapy with LDL goal < 70 mg/dL. Will start lipitor 20 mg daily.  PLAN:    In order of problems listed above:  Start lipitor 20 mg daily Fasting lipids 3 months [will help guide statin therapy dosing] Follow up as needed         Medication Adjustments/Labs and Tests Ordered: Current medicines are reviewed at length with the patient today.  Concerns regarding medicines are outlined above.  Orders Placed This  Encounter  Procedures   Lipid panel   EKG 12-Lead   Meds ordered this encounter  Medications   atorvastatin (LIPITOR) 20 MG tablet    Sig: Take 1 tablet (20 mg total) by mouth daily.    Dispense:  90 tablet    Refill:  3    Patient Instructions  Medication Instructions:  START: ATORVASTATIN '20mg'$  ONCE DAILY  *If you need a refill on your cardiac medications before your next appointment, please call your pharmacy*  Lab Work: Please return for Blood Work in [place]. No appointment needed, lab here at the office is open Monday-Friday from 8AM to 4PM and closed daily for lunch from 12:45-1:45.   If you have labs (blood work) drawn today and your tests are completely normal, you will receive your results only by: [place] (if you have MyChart) OR A paper copy in the mail If you have any lab test that is abnormal or we need to change your treatment, we will call you to review the results.  Testing/Procedures: None Ordered At This Time.   Follow-Up: At [facility], you and your health needs are our priority.  As part of our continuing mission to provide you with exceptional heart care, we have created designated Provider Care Teams.  These Care Teams include your primary Cardiologist (physician) and Advanced Practice Providers (APPs -  Physician Assistants and Nurse Practitioners) who all work together to provide you with the care you need, when you need it.  We  recommend signing up for the patient portal called "MyChart".  Sign up information is provided on this After Visit Summary.  MyChart is used to connect with patients for Virtual Visits (Telemedicine).  Patients are able to view lab/test results, encounter notes, upcoming appointments, etc.  Non-urgent messages can be sent to your provider as well.   To learn more about what you can do with MyChart, go to [email or web address].    Your next appointment:   AS NEEDED   The format for your next appointment:   In Person  Provider:   Janina Mayo, MD             Signed, Janina Mayo, MD  08/08/2022 2:03 PM    [place]

## 2022-08-09 ENCOUNTER — Ambulatory Visit (HOSPITAL_COMMUNITY)
Admission: RE | Admit: 2022-08-09 | Discharge: 2022-08-09 | Disposition: A | Discharge status: Home / Self Care | Payer: 59 | Source: Ambulatory Visit | Attending: Radiation Oncology | Admitting: Radiation Oncology

## 2022-08-09 ENCOUNTER — Other Ambulatory Visit (HOSPITAL_COMMUNITY): Payer: Self-pay

## 2022-08-09 ENCOUNTER — Ambulatory Visit (HOSPITAL_COMMUNITY): Payer: 59

## 2022-08-09 DIAGNOSIS — J984 Other disorders of lung: Secondary | ICD-10-CM | POA: Diagnosis not present

## 2022-08-09 DIAGNOSIS — C090 Malignant neoplasm of tonsillar fossa: Secondary | ICD-10-CM | POA: Diagnosis not present

## 2022-08-09 DIAGNOSIS — R911 Solitary pulmonary nodule: Secondary | ICD-10-CM | POA: Diagnosis not present

## 2022-08-09 DIAGNOSIS — R931 Abnormal findings on diagnostic imaging of heart and coronary circulation: Secondary | ICD-10-CM | POA: Diagnosis not present

## 2022-08-09 DIAGNOSIS — M47812 Spondylosis without myelopathy or radiculopathy, cervical region: Secondary | ICD-10-CM | POA: Diagnosis not present

## 2022-08-09 MED ORDER — IOHEXOL 300 MG/ML  SOLN
80.0000 mL | Freq: Once | INTRAMUSCULAR | Status: AC | PRN
  Administered 2022-08-09: 80 mL via INTRAVENOUS

## 2022-08-12 ENCOUNTER — Inpatient Hospital Stay
Admission: RE | Admit: 2022-08-12 | Discharge: 2022-08-12 | Disposition: A | Discharge status: Home / Self Care | Payer: Self-pay | Source: Ambulatory Visit | Attending: Radiation Oncology | Admitting: Radiation Oncology

## 2022-08-12 ENCOUNTER — Inpatient Hospital Stay: Payer: 59 | Admitting: Dietician

## 2022-08-12 DIAGNOSIS — C090 Malignant neoplasm of tonsillar fossa: Secondary | ICD-10-CM

## 2022-08-16 NOTE — Progress Notes (Addendum)
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 She completed treatment on 05-10-22.    Pain issues, if any: no Using a feeding tube?: no Weight changes, if any:  Wt Readings from Last 3 Encounters:  08/23/22 119 lb (54 kg)  08/08/22 118 lb 9.6 oz (53.8 kg)  07/08/22 118 lb 9.6 oz (53.8 kg)    Swallowing issues, if any: has to chew a lot for food Smoking or chewing tobacco? no Using fluoride trays daily? no Last ENT visit was on: October 2023, no scope done Other notable issues, if any: CT results and understand findings. Neck is still tender at site of radiation. Uses ensures five a week.   Vitals:   08/23/22 1556  BP: 131/78  Pulse: 80  Resp: 18  Temp: (!) 97.5 F (36.4 C)  SpO2: 100%

## 2022-08-18 ENCOUNTER — Ambulatory Visit: Payer: 59 | Admitting: Dietician

## 2022-08-18 NOTE — Progress Notes (Signed)
Nutrition Follow-up:  Patient has completed adjuvant radiation for SCC of left tonsil. Final treatment on 8/29.   Spoke with pt via telephone. She reports overall doing well and feels she is making progress in some areas. Patient continues to have soreness at jaw line and around incision. Patient reports clogged salivary glands under left tongue. Chewing is painful due to this. If she does not chew well, swallowing is painful. Patient has been informed by dentist that this is likely permanent damage related to radiation.   Patient states on positive note, swallowing is better. It is uncomfortable for ~30 minutes vs 2 hours after eating. Taste has returned to normal. Acidic foods are trial and error. Sometimes they burn the side and under the tongue.   She is drinking one Ensure Plus HP (350 kcal, 20 g) in the morning. Patient has had difficulty with finding these recently. She is glad to be able to enjoy salads again, although this takes a long time to eat. Patient reports trying to choose high calorie, high protein foods (panera broccoli cheddar soup, 1/2 sandwiches on thin bread with added mayo and dipped in high calorie sauce, cottage cheese and diced peaches).  Medications: reviewed   Labs: 11/7 labs reviewed  Anthropometrics: Patient reports weights are stable on home scale ~ 113 lb   Last documented wt -  118 lb 9.6 oz on 11/22 decreased from 121 lb on 8/28   NUTRITION DIAGNOSIS: Inadequate oral intake improving   INTERVENTION:  Continue strategies for increasing calories and protein with small frequent meals and snacks Continue drinking Ensure Plus HP/equivalent daily Will provide (as able) one case Ensure Plus HP - pt will pick this up 12/12 Support and encouragement     MONITORING, EVALUATION, GOAL: weight trends, intake   NEXT VISIT: To be scheduled as needed

## 2022-08-23 ENCOUNTER — Encounter: Payer: Self-pay | Admitting: Radiation Oncology

## 2022-08-23 ENCOUNTER — Ambulatory Visit
Admission: RE | Admit: 2022-08-23 | Discharge: 2022-08-23 | Disposition: A | Discharge status: Home / Self Care | Payer: 59 | Source: Ambulatory Visit | Attending: Radiation Oncology | Admitting: Radiation Oncology

## 2022-08-23 VITALS — BP 131/78 | HR 80 | Temp 97.5°F | Resp 18 | Ht 64.0 in | Wt 119.0 lb

## 2022-08-23 DIAGNOSIS — Z79899 Other long term (current) drug therapy: Secondary | ICD-10-CM | POA: Diagnosis not present

## 2022-08-23 DIAGNOSIS — Z923 Personal history of irradiation: Secondary | ICD-10-CM | POA: Diagnosis not present

## 2022-08-23 DIAGNOSIS — C090 Malignant neoplasm of tonsillar fossa: Secondary | ICD-10-CM

## 2022-08-23 DIAGNOSIS — C098 Malignant neoplasm of overlapping sites of tonsil: Secondary | ICD-10-CM | POA: Diagnosis not present

## 2022-08-23 DIAGNOSIS — Z85818 Personal history of malignant neoplasm of other sites of lip, oral cavity, and pharynx: Secondary | ICD-10-CM | POA: Diagnosis not present

## 2022-08-23 DIAGNOSIS — R911 Solitary pulmonary nodule: Secondary | ICD-10-CM | POA: Insufficient documentation

## 2022-08-23 DIAGNOSIS — Z79890 Hormone replacement therapy (postmenopausal): Secondary | ICD-10-CM | POA: Insufficient documentation

## 2022-08-23 NOTE — Progress Notes (Signed)
Radiation Oncology         [phone]) [phone] ________________________________  Name: Jennifer [name] MRN: [id number]  Date: 08/23/2022  DOB: 05-22-1961  Follow-Up Visit Note  CC: [place], [place], [place]  [place], [place], [place]  Diagnosis and Prior Radiotherapy:       ICD-10-CM   1. 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 Chest CT showed 13 mm ground-glass nodule in the left lung apex.    NARRATIVE: She reports to be doing well. She is back at work. She recently had pain at the [facility] of her tongue that has affected her eating. She states her pain has gotten a little better. Her throat pain is not as severe and doesn't last as long. She feels like she is making progress. Saw dentist and was told she has clogged salivary ducts in left oral cavity. Patient has also noticed some left ear pain recently.   Pain issues, if any: no Using a feeding tube?: no Weight changes, if any:     Wt Readings from Last 3 Encounters:  08/23/22 119 lb (54 kg)  08/08/22 118 lb 9.6 oz  (53.8 kg)  07/08/22 118 lb 9.6 oz (53.8 kg)  Swallowing issues, if any: has to chew a lot for food Smoking or chewing tobacco? no Using fluoride trays daily? no Last ENT visit was on: October 2023, no scope done Other notable issues, if any: CT results and understand findings. Neck is still tender at site of radiation. Uses ensures five a week.    ALLERGIES:  is allergic to codeine and oxycodone.  Meds: Current Outpatient Medications  Medication Sig Dispense Refill   atorvastatin (LIPITOR) 20 MG tablet Take 1 tablet (20 mg total) by mouth daily. 90 tablet 3   calcium elemental as carbonate (BARIATRIC TUMS ULTRA) 400 MG chewable tablet Chew 1,000 mg by mouth daily.     cholecalciferol (VITAMIN D3) 25 MCG (1000 UNIT) tablet Take 1,000 Units by mouth 3 (three) times a week.     estradiol (ESTRACE) 0.1 MG/GM vaginal cream Apply intravaginally once daily for 2 weeks, then decrease to 1 to 3 times weekly 42.5 g 0   ferrous sulfate 325 (65 FE) MG tablet Take 325 mg by mouth 3 (three) times a week.      ibuprofen (ADVIL,MOTRIN) 200 MG tablet Take 400 mg by mouth every 6 (six) hours as needed for mild pain.     Misc Natural Products (PROGESTERONE EX) Apply 5 drops topically at bedtime. Bioidentical progesterone compounded solution     PRESCRIPTION MEDICATION Apply 3 drops topically daily. Bioidentical estradiol and  testosterone compounded solution     rOPINIRole (REQUIP) 0.25 MG tablet Take 1 tablet daily (with the 0.5 mg daily for a total of 0.75 mg daily). 30 tablet 1   rOPINIRole (REQUIP) 0.5 MG tablet Take 1 tablet by mouth daily (take with 0.25 mg tablet to equal 0.75 mg daily) 30 tablet 1   famotidine (PEPCID) 20 MG tablet TAKE 1 TABLET BY MOUTH TWICE DAILY (Patient taking differently: Take 20 mg by mouth at bedtime as needed for heartburn or indigestion.) 180 tablet 1   zolpidem (AMBIEN) 5 MG tablet TAKE 1 TABLET BY MOUTH AT BEDTIME AS NEEDED FOR SLEEP 10 tablet 0   No current  facility-administered medications for this encounter.    Physical Findings: The patient is in no acute distress. Patient is alert and oriented. Wt Readings from Last 3 Encounters:  08/23/22 119 lb (54 kg)  08/08/22 118 lb 9.6 oz (53.8 kg)  07/08/22 118 lb 9.6 oz (53.8 kg)    height is '5\' 4"'$  (1.626 m) and weight is 119 lb (54 kg). Her temperature is 97.5 F (36.4 C) (abnormal). Her blood pressure is 131/78 and her pulse is 80. Her respiration is 18 and oxygen saturation is 100%. .  General: Alert and oriented, in no acute distress HEENT: Head is normocephalic. Extraocular movements are intact. Oropharynx moist, is notable for raised papules on the left underbelly of the tongue . No sign of tumor or infection. Uvula and posterior pharyngeal wall are slightly erythematous. Mucosa in the left tonsillar area is whitish consistent with incompletely healed mucosa.  Left TM: slightly erythematous and injected but no fluid or pus Right TM: slightly erythematous and injected. No fluid or pus Neck: Neck is notable for no masses Skin: Skin in treatment fields shows satisfactory healing on neck Extremities: No cyanosis or edema. Lymphatics: see Neck Exam Psychiatric: Judgment and insight are intact. Affect is appropriate.   Lab Findings: Lab Results  Component Value Date   WBC 6.7 07/19/2022   HGB 15.3 (H) 07/19/2022   HCT 45.5 07/19/2022   MCV 91.3 07/19/2022   PLT 242.0 07/19/2022    Lab Results  Component Value Date   TSH 1.26 07/19/2022    Radiographic Findings: CT Soft Tissue Neck W Contrast  Result Date: 08/10/2022 CLINICAL DATA:  Malignant tumor of the tonsillar fossa. EXAM: CT NECK WITH CONTRAST TECHNIQUE: Multidetector CT imaging of the neck was performed using the standard protocol following the bolus administration of intravenous contrast. RADIATION DOSE REDUCTION: This exam was performed according to the departmental dose-optimization program which includes automated exposure  control, adjustment of the mA and/or kV according to patient size and/or use of iterative reconstruction technique. CONTRAST:  18m OMNIPAQUE IOHEXOL 300 MG/ML  SOLN COMPARISON:  12/09/2021 FINDINGS: Pharynx and larynx: No mucosal or submucosal mass is evident. Mild fullness previously present in the left tonsil region is no longer visible. No other mucosal or submucosal lesion. Salivary glands: Parotid and submandibular glands are normal. Surgical clips present adjacent to the posterior margin of the left submandibular gland. Thyroid: Normal Lymph nodes: No lymphadenopathy. Previously seen enlarged and hypermetabolic left level 2 lymph node has been surgically resected. A total of 3 surgical clips are present in the region. No evidence of residual mass. No other enlarged lymph node on either side of the neck. Vascular: No abnormal vascular finding. Limited intracranial: Normal Visualized orbits: Normal Mastoids and visualized paranasal sinuses: Clear Skeleton: Ordinary cervical spondylosis. Upper chest: Pleural and parenchymal scarring at the lung  apices, left more than right, progressive since the prior study. Presumably this is subsequent to previous radiation. Other: None IMPRESSION: 1. No evidence of residual or recurrent tumor or adenopathy in the left neck. Previously seen fullness in the left tonsil region is no longer visible. Previously seen enlarged and hypermetabolic left level 2 lymph node has been surgically resected. No other enlarged lymph node on either side of the neck. 2. Pleural and parenchymal scarring at the lung apices, left more than right, progressive since the prior study. Presumably this is subsequent to previous radiation. Electronically Signed   By: Nelson Chimes M.D.   On: 08/10/2022 15:41   CT Chest W Contrast  Result Date: 08/10/2022 CLINICAL DATA:  History of head neck cancer monitor. * Tracking Code: BO * EXAM: CT CHEST WITH CONTRAST TECHNIQUE: Multidetector CT imaging of the  chest was performed during intravenous contrast administration. RADIATION DOSE REDUCTION: This exam was performed according to the departmental dose-optimization program which includes automated exposure control, adjustment of the mA and/or kV according to patient size and/or use of iterative reconstruction technique. CONTRAST:  67m OMNIPAQUE IOHEXOL 300 MG/ML  SOLN COMPARISON:  Multiple priors including PET-CT December 29, 2021 and chest CT December 16, 2021. FINDINGS: Cardiovascular: Normal caliber thoracic aorta. No central pulmonary embolus on this nondedicated study. Normal size heart. No significant pericardial effusion/thickening. Mediastinum/Nodes: No suspicious thyroid nodule. No pathologically enlarged mediastinal, hilar or axillary lymph nodes. The esophagus is grossly unremarkable. Lungs/Pleura: Biapical pleuroparenchymal scarring with a new ground-glass nodule in the left lung apex measures 13 mm on image 31/5. No pleural effusion. No pneumothorax. Upper Abdomen: No acute abnormality. Musculoskeletal: No aggressive lytic or blastic lesion of bone. IMPRESSION: 1. Biapical pleuroparenchymal scarring with a new 13 mm ground-glass nodule in the left lung apex, which is nonspecific possibly reflecting sequela of radiation therapy. Suggest short-term interval follow-up dedicated chest CT to assess stability/resolution. 2. No thoracic adenopathy. Electronically Signed   By: JDahlia BailiffM.D.   On: 08/10/2022 15:14    Impression/Plan:    1) Head and Neck Cancer Status: I personally reviewed her imaging with her.  She is in remission and healing well from post op RT; We discussed her case at our last tumor conference and reviewed her recent CT of neck and chest. Imaging is reassuring for no disease recurrence. She is still experiencing pain at the left  side of the underbelly of her tongue and submandibular region. She believes this pain is slowly improving. This pain could be residual effects of her oncologic  treatments.  2) Nutritional Status:  Wt Readings from Last 3 Encounters:  08/23/22 119 lb (54 kg)  08/08/22 118 lb 9.6 oz (53.8 kg)  07/08/22 118 lb 9.6 oz (53.8 kg)   Pushing PO intake, weight stabilizing.  PEG tube: none  3) skin care: skin healed well.   4) Swallowing: Good function now.  Continue speech-language pathology exercises  5) Dental: Encouraged to continue regular followup with dentistry, and dental hygiene including fluoride rinses.  She will have dentist call me before any extractions in future, if they are recommended for some reason  6) Thyroid function: will get TSH at next visit in 9 months. Lab Results  Component Value Date   TSH 1.26 07/19/2022    7) Other:  Follow-up  with CT neck/chest with contrast in 9 months. The patient was encouraged to call with any issues or questions before then. Will set her up to begin survivorship program in 6 months.  8) Ear pain - likely referred pain from her throat. No concerning findings on physical exam. I expect ear pain to improve a as throat pain improves  9) She knows to continue follow-up with ENT in the interim.   On date of service, in total, I spent 30 minutes on this encounter. Patient was seen in person. _____________________________________   Leona Singleton, PA   Eppie Gibson, MD

## 2022-08-24 ENCOUNTER — Other Ambulatory Visit: Payer: Self-pay

## 2022-08-24 DIAGNOSIS — C099 Malignant neoplasm of tonsil, unspecified: Secondary | ICD-10-CM

## 2022-08-24 NOTE — Progress Notes (Signed)
Oncology Nurse Navigator Documentation   I met with Jennifer [name] before and during her follow up with Dr. Isidore Moos yesterday. She continues to recover from her radiation treatment. She will see Dr. Nicolette Bang in the next few months. She has been referred to head and neck survivorship in 6 months and will see Dr. Isidore Moos in 9 months after a CT neck/chest to receive results. She knows to call me if she has any concerns or questions.   [name] RN, BSN, OCN Head & Neck Oncology Nurse [place] [facility] Phone # [phone]  Fax # [phone]

## 2022-08-25 ENCOUNTER — Encounter: Payer: Self-pay | Admitting: Radiation Oncology

## 2022-08-27 ENCOUNTER — Encounter: Payer: Self-pay | Admitting: Radiation Oncology

## 2022-09-19 ENCOUNTER — Encounter: Payer: Self-pay | Admitting: Physician Assistant

## 2022-09-20 ENCOUNTER — Encounter: Payer: Self-pay | Admitting: Internal Medicine

## 2022-09-20 DIAGNOSIS — E785 Hyperlipidemia, unspecified: Secondary | ICD-10-CM

## 2022-09-20 DIAGNOSIS — R931 Abnormal findings on diagnostic imaging of heart and coronary circulation: Secondary | ICD-10-CM

## 2022-09-22 ENCOUNTER — Other Ambulatory Visit (HOSPITAL_COMMUNITY): Payer: Self-pay

## 2022-09-22 MED ORDER — EZETIMIBE 10 MG PO TABS
10.0000 mg | ORAL_TABLET | Freq: Every day | ORAL | 3 refills | Status: DC
  Filled 2022-09-22: qty 90, 90d supply, fill #0

## 2022-09-27 ENCOUNTER — Telehealth: Payer: Self-pay | Admitting: *Deleted

## 2022-09-27 DIAGNOSIS — C099 Malignant neoplasm of tonsil, unspecified: Secondary | ICD-10-CM | POA: Diagnosis not present

## 2022-09-27 NOTE — Telephone Encounter (Signed)
CALLED PATIENT TO INFORM OF CT FOR 05-22-23- ARRIVAL TIME- 10:15 AM @ [facility], PATIENT TO HAVE WATER ONLY- 4 HRS. PRIOR TO TEST, PATIENT TO RECEIVE RESULTS FROM DR. SQUIRE ON 05-23-23 - 1:30 PM FOR LABS AND 2 PM FOR FU, LVM FOR A RETURN CALL

## 2022-09-29 ENCOUNTER — Encounter: Payer: Self-pay | Admitting: Radiation Oncology

## 2022-10-14 ENCOUNTER — Encounter: Payer: Self-pay | Admitting: Radiation Oncology

## 2022-10-20 ENCOUNTER — Ambulatory Visit: Payer: 59 | Attending: Radiation Oncology

## 2022-10-20 DIAGNOSIS — R1312 Dysphagia, oropharyngeal phase: Secondary | ICD-10-CM | POA: Insufficient documentation

## 2022-10-20 NOTE — Therapy (Signed)
[facility] [address] [place], [place], [address] Phone: [phone]   Fax:  [phone]  Speech Language Pathology Treatment/recertification  Patient Details  Name: Jennifer [name] MRN: [id number] Date of Birth: 02-16-1961 No data recorded  Encounter Date: 10/20/2022   End of Session - 10/20/22 1407     Visit Number 3    Number of Visits 7    Date for SLP Re-Evaluation 01/18/23    SLP Start Time 0806    SLP Stop Time  0846    SLP Time Calculation (min) 40 min    Activity Tolerance Patient tolerated treatment well              Past Medical History:  Diagnosis Date   Anemia    Bowel obstruction ([place])    Cancer ([place]) 12-16-2021   metastatic squamous cell carcimoma   Fibroids    GERD (gastroesophageal reflux disease)    H/O small bowel obstruction    Hyperlipidemia    no meds, diet controlled   Restless legs syndrome (RLS)     Past Surgical History:  Procedure Laterality Date   ABDOMINAL HYSTERECTOMY     DIAGNOSTIC LAPAROSCOPY     x several with lysis of adhesions   EXPLORATORY LAPAROTOMY     had bowel obstruction   EYE SURGERY Bilateral    Lasik   LARYNGOSCOPY AND ESOPHAGOSCOPY N/A 01/07/2022   Procedure: DIRECT LARYNGOSCOPY WITH BIOPSY; ESOPHAGOSCOPY; FROZEN SECTIONS;  Surgeon: [name], MD;  Location: [facility];  Service: ENT;  Laterality: N/A;   ROTATOR CUFF REPAIR Right 2017   SMALL INTESTINE SURGERY  [id number]   [facility] - Dr Adriana Reams    There were no vitals filed for this visit.   Subjective Assessment       Subjective Pt states no more pain with swallowing.         Currently in Pain? No                                          ADULT SLP TREATMENT                General Information    Behavior/Cognition Alert;Cooperative          Treatment Provided    Treatment provided Dysphagia          Dysphagia Treatment    Treatment Methods Skilled observation;Compensation  strategy training;Patient/caregiver education     Patient observed directly with PO's Yes     Type of PO's observed Dys III solids, puree, Thin liquids     Liquids provided via Cup               Other treatment/comments Jennifer [name] stated she did the HEP until "I was swallowing regular food more." Pt has been completing HEP now for 3 weeks. SLP told her another 3 (up to 5) weeks and if she cont to have "stopped up" feeling with POs MBS may be necessary.  SLP encouraged pt to do HEP x2/day, aiming for 20-35 reps/day for all swallowing exercises (not Sh[place]). Today pt req'd rare min A with [place] (lingual protrusion). No overt s/sx aspiration PNA; no overt s/sx oral or pharyngeal dysphagia except regular throat clearing with liquids although pt states this is due to "coating" of pharyngeal musculature after solid bolus.  Assessment / Recommendations / Plan    Plan Continue with current plan of care          Dysphagia Recommendations    Diet recommendations Other(comment)  regular diet/dys III/thin     Medication Administration --   as tolerated         Progression Toward Goals    Progression toward goals Progressing toward goals             CLINICAL IMPRESSION: RECERT TODAY. Patient is a 62 y.o. female who was seen today for swallowing as radiation therapy has completed. Today pt ate applesauce and a cereal bar without overt s/sx oral or pharyngeal difficulty including aspiration. Thn liquids resulted in consistent throat clearing however pt of the opinion this is due more to "coating" in her pharynx and not due to penetration/aspiration. At this time pt swallowing is deemed [facility] with regular/thin. There are no overt s/s aspiration PNA observed by SLP nor any reported by pt at this time. Data indicate that pt's swallow ability will likely decrease over the course of radiation/chemoradiation therapy and could very well decline over time following the conclusion of that therapy due to muscle  disuse atrophy and/or muscle fibrosis. Pt will cont to need to be seen by SLP in order to assess safety of PO intake, assess the need for recommending any objective swallow assessment, and ensuring pt is correctly completing the individualized HEP.   OBJECTIVE IMPAIRMENTS include dysphagia. These impairments are limiting patient from safety when swallowing. Factors affecting potential to achieve goals and functional outcome are  none . Patient will benefit from skilled SLP services to address above impairments and improve overall function.   REHAB POTENTIAL: Excellent     GOALS: Goals reviewed with patient? No   SHORT TERM GOALS: Target completion:  first 4 therapy sessions (overall visit #5) - modified due to time from previous session       pt will complete HEP with rare min A  Baseline: 10/20/22 Goal status: Ongoing   2.  pt will tell SLP why pt is completing HEP with modified independence Baseline:  Goal status: Met   3.  pt will describe 3 overt s/s aspiration PNA with modified independence Baseline:  Goal status: Ongoing   4.  pt will tell SLP how a food journal could hasten return to a more normalized diet Baseline:  Goal status: Ongoing     LONG TERM GOALS: Target: sixth ST session (overall visit #7)   pt will complete HEP with modified independence over two visits Baseline:  Goal status: Ongoing   2.  pt will describe how to modify HEP over time, and the timeline associated with reduction in HEP frequency with modified independence over two sessions Baseline:  Goal status: Ongoing     PLAN: SLP FREQUENCY:  once every approx 4 weeks   SLP DURATION:  7 sessions   PLANNED INTERVENTIONS: Aspiration precaution training, Pharyngeal strengthening exercises, Diet toleration management , Trials of upgraded texture/liquids, SLP instruction and feedback, Compensatory strategies, and Patient/family education        Patient will benefit from skilled therapeutic  intervention in order to improve the following deficits and impairments:   Dysphagia, oropharyngeal phase    Problem List Patient Active Problem List   Diagnosis Date Noted   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[place]) 03/14/2022   Gingival recession, generalized 03/11/2022   Encounter for preoperative dental examination 03/09/2022   Teeth missing 03/09/2022   Incipient enamel caries 03/09/2022  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   Tonsil cancer ([place]) 01/25/2022   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, [place], [place], [address] Phone: [phone]   Fax:  [phone]   Name: Jennifer [name] MRN: [id number] Date of Birth: 12-Feb-1961

## 2022-10-21 ENCOUNTER — Ambulatory Visit: Payer: Commercial Managed Care - PPO

## 2022-10-21 DIAGNOSIS — L570 Actinic keratosis: Secondary | ICD-10-CM | POA: Diagnosis not present

## 2022-10-21 DIAGNOSIS — L821 Other seborrheic keratosis: Secondary | ICD-10-CM | POA: Diagnosis not present

## 2022-10-21 DIAGNOSIS — C44722 Squamous cell carcinoma of skin of right lower limb, including hip: Secondary | ICD-10-CM | POA: Diagnosis not present

## 2022-10-21 DIAGNOSIS — D2272 Melanocytic nevi of left lower limb, including hip: Secondary | ICD-10-CM | POA: Diagnosis not present

## 2022-10-28 ENCOUNTER — Other Ambulatory Visit: Payer: Self-pay

## 2022-10-28 ENCOUNTER — Ambulatory Visit: Payer: 59 | Attending: Internal Medicine | Admitting: Pharmacist

## 2022-10-28 ENCOUNTER — Encounter: Payer: Self-pay | Admitting: Pharmacist

## 2022-10-28 DIAGNOSIS — T466X5A Adverse effect of antihyperlipidemic and antiarteriosclerotic drugs, initial encounter: Secondary | ICD-10-CM

## 2022-10-28 DIAGNOSIS — E785 Hyperlipidemia, unspecified: Secondary | ICD-10-CM

## 2022-10-28 DIAGNOSIS — R931 Abnormal findings on diagnostic imaging of heart and coronary circulation: Secondary | ICD-10-CM

## 2022-10-28 DIAGNOSIS — M791 Myalgia, unspecified site: Secondary | ICD-10-CM | POA: Diagnosis not present

## 2022-10-28 NOTE — Patient Instructions (Addendum)
It was nice meeting you today  We would like your LDL (bad cholesterol) to be less than 70  We will update your lipid panel today  The next options would be Nexletol or Repatha. I will contact you next week and we can discuss your options  Either would be a prior authorization  Please message or call with any questions  Karren Cobble, PharmD, [place], [place], [place], [place], [place], [address] Phone: [phone], Fax: [phone]

## 2022-10-28 NOTE — Progress Notes (Signed)
Patient ID: Jennifer [name]                 DOB: 18-Feb-1961                    MRN: [id number]     HPI: Jennifer [name] is a 62 y.o. female patient referred to lipid clinic by Dr Harl Bowie. PMH is significant for squamous cell carcinoma in tonsil, small bowel obstruction, and elevated coronary calcium score. Recently tried both atorvastatin and ezetimibe.  Patient presents in good spirits. Had frequent abdominal and chest scans due to bowel obstruction and was noted to have aortic atherosclerosis. Requested chest CT which showed calcium score of 49.  Eats a heart healthy diet. Likes vegetables, carrots, and salads. Unfortunately after throat surgery was not able to chew well and had been mostly eating out of straw. Has had unintentional weight loss. Reports she had not been exercising but just signed up for [employer]. Drinks a social amount of alcohol over the weekends.  Father has history of CAD. Last lipid panel showed LDL of 133 however this was in 2021. Updating lab work today.  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  Current Medications: N/A  Intolerances:  Atorvastatin Ezetimibe  Risk Factors:  Family history (father CAD)  LDL goal: <70  Labs: TC 224, Trigs 63, HDL 78, LDL 133 (04/20/20)  Imaging: Coronary calcium score of 49. This was 87th percentile for age-, race-, and sex-matched controls.   Past Medical History:  Diagnosis Date   Anemia    Bowel obstruction ([facility])    Cancer ([place]) 12-16-2021   metastatic squamous cell carcimoma   Fibroids    GERD (gastroesophageal reflux disease)    H/O small bowel obstruction    Hyperlipidemia    no meds, diet controlled   Restless legs syndrome (RLS)     Current Outpatient Medications on File Prior to Visit  Medication Sig Dispense Refill   calcium elemental as carbonate (BARIATRIC TUMS ULTRA) 400 MG chewable tablet Chew 1,000 mg by mouth daily.     cholecalciferol (VITAMIN D3) 25 MCG (1000 UNIT) tablet Take 1,000 Units by mouth 3  (three) times a week.     estradiol (ESTRACE) 0.1 MG/GM vaginal cream Apply intravaginally once daily for 2 weeks, then decrease to 1 to 3 times weekly 42.5 g 0   ferrous sulfate 325 (65 FE) MG tablet Take 325 mg by mouth 3 (three) times a week.      ibuprofen (ADVIL,MOTRIN) 200 MG tablet Take 400 mg by mouth every 6 (six) hours as needed for mild pain.     Misc Natural Products (PROGESTERONE EX) Apply 5 drops topically at bedtime. Bioidentical progesterone compounded solution     PRESCRIPTION MEDICATION Apply 3 drops topically daily. Bioidentical estradiol and testosterone compounded solution     zolpidem (AMBIEN) 5 MG tablet TAKE 1 TABLET BY MOUTH AT BEDTIME AS NEEDED FOR SLEEP 10 tablet 0   No current facility-administered medications on file prior to visit.    Allergies  Allergen Reactions   Codeine Nausea And Vomiting   Oxycodone Nausea And Vomiting    Assessment/Plan:  1. Hyperlipidemia - Patient last LDL 133 which is above goal of <70 however this was in 2021. Updating today. Since both atorvastatin and ezetimibe caused myalgias, discussed next lipid lowering options such as Nexletol or Repatha. Explained mechanism of action and possible adverse effects of both. Using demo pen, educated on storage, site selection., and administration on  Repatha. Patient willing to try either. Will await results of lipid panel and contact patient.  Karren Cobble, PharmD, BCACP, [place], [place], [place], [place], [address] Phone: [phone], Fax: [phone]

## 2022-10-29 LAB — LIPID PANEL
Chol/HDL Ratio: 2.4 ratio (ref 0.0–4.4)
Cholesterol, Total: 232 mg/dL — ABNORMAL HIGH (ref 100–199)
HDL: 98 mg/dL (ref >39)
LDL Chol Calc (NIH): 124 mg/dL — ABNORMAL HIGH (ref 0–99)
Triglycerides: 57 mg/dL (ref 0–149)
VLDL Cholesterol Cal: 10 mg/dL (ref 5–40)

## 2022-11-02 ENCOUNTER — Encounter: Payer: Self-pay | Admitting: Pharmacist

## 2022-11-03 ENCOUNTER — Encounter: Payer: Self-pay | Admitting: Radiation Oncology

## 2022-11-03 ENCOUNTER — Telehealth: Payer: Self-pay

## 2022-11-03 ENCOUNTER — Other Ambulatory Visit (HOSPITAL_COMMUNITY): Payer: Self-pay

## 2022-11-03 DIAGNOSIS — E785 Hyperlipidemia, unspecified: Secondary | ICD-10-CM

## 2022-11-03 DIAGNOSIS — R931 Abnormal findings on diagnostic imaging of heart and coronary circulation: Secondary | ICD-10-CM

## 2022-11-03 NOTE — Telephone Encounter (Signed)
Pharmacy Patient Advocate Encounter   Received notification that prior authorization for Repatha 140MG/ML is required/requested.    PA submitted on 11/03/22 to (ins) [employer] via CoverMyMeds Key [id number]  Status is pending

## 2022-11-03 NOTE — Progress Notes (Signed)
[place] Phone: [phone] Subjective:   Jennifer [name], am serving as a scribe for Dr. Hulan Saas.  I'm seeing this patient by the request  of:  Inda Coke, [place]  CC: back pain follow up   [id number]  Jennifer [name] is a 62 y.o. female coming in with complaint of back pain. Patient states that she has had pain for a long time. Has had radiating pain into R hamstring. Last seen in 2021. Would like to get back to getting manipulations on regular basis.        Past Medical History:  Diagnosis Date   Anemia    Bowel obstruction (HCC)    Cancer ([place]) 12-16-2021   metastatic squamous cell carcimoma   Fibroids    GERD (gastroesophageal reflux disease)    H/O small bowel obstruction    Hyperlipidemia    [name] meds, diet controlled   Restless legs syndrome (RLS)    Past Surgical History:  Procedure Laterality Date   ABDOMINAL HYSTERECTOMY     DIAGNOSTIC LAPAROSCOPY     x several with lysis of adhesions   EXPLORATORY LAPAROTOMY     had bowel obstruction   EYE SURGERY Bilateral    Lasik   LARYNGOSCOPY AND ESOPHAGOSCOPY N/A 01/07/2022   Procedure: DIRECT LARYNGOSCOPY WITH BIOPSY; ESOPHAGOSCOPY; FROZEN SECTIONS;  Surgeon: [name], MD;  Location: [facility];  Service: ENT;  Laterality: N/A;   ROTATOR CUFF REPAIR Right 2017   SMALL INTESTINE SURGERY  [id number]   [facility] - Dr Adriana Reams   Social History   Socioeconomic History   Marital status: Married    Spouse name: Not on file   Number of children: 3   Years of education: Not on file   Highest education level: Not on file  Occupational History   Occupation: [employer]    Employer: [employer]  Tobacco Use   Smoking status: Never   Smokeless tobacco: Never   Tobacco comments:    smoked 30 years ago in college  Vaping Use   Vaping Use: Never used  Substance and Sexual Activity   Alcohol use: Not Currently    Alcohol/week: 3.0  standard drinks of alcohol    Types: 3 Glasses of wine per week    Comment: socially   Drug use: [name]   Sexual activity: Yes    Partners: Male    Birth control/protection: None, Surgical    Comment: Hysterectomy  Other Topics Concern   Not on file  Social History Narrative   CMA with [employer]   Social Determinants of Health   Financial Resource Strain: Not on file  Food Insecurity: [name] Food Insecurity (01/20/2022)   Hunger Vital Sign    Worried About Running Out of Food in the Last Year: Never true    Ran Out of Food in the Last Year: Never true  Transportation Needs: [name] Transportation Needs (01/20/2022)   PRAPARE - Hydrologist (Medical): [name]    Lack of Transportation (Non-Medical): [name]  Physical Activity: Not on file  Stress: Not on file  Social Connections: Not on file   Allergies  Allergen Reactions   Atorvastatin Other (See Comments)    myalgias   Codeine Nausea And Vomiting   Oxycodone Nausea And Vomiting   Zetia [Ezetimibe] Other (See Comments)    myalgias   Family History  Problem Relation Age of Onset   Ovarian cancer  Mother    Early death Mother    Diabetes Father    Hyperlipidemia Father    Heart disease Father    Cancer - Other Maternal Grandmother        Lymphatic   Breast cancer Paternal Grandmother    COPD Paternal Grandfather    Breast cancer Maternal Aunt        Current Outpatient Medications (Analgesics):    ibuprofen (ADVIL,MOTRIN) 200 MG tablet, Take 400 mg by mouth every 6 (six) hours as needed for mild pain.  Current Outpatient Medications (Hematological):    ferrous sulfate 325 (65 FE) MG tablet, Take 325 mg by mouth 3 (three) times a week.   Current Outpatient Medications (Other):    calcium elemental as carbonate (BARIATRIC TUMS ULTRA) 400 MG chewable tablet, Chew 1,000 mg by mouth daily.   cholecalciferol (VITAMIN D3) 25 MCG (1000 UNIT) tablet, Take 1,000 Units by mouth 3 (three) times a week.   estradiol  (ESTRACE) 0.1 MG/GM vaginal cream, Apply intravaginally once daily for 2 weeks, then decrease to 1 to 3 times weekly   Misc Natural Products (PROGESTERONE EX), Apply 5 drops topically at bedtime. Bioidentical progesterone compounded solution   PRESCRIPTION MEDICATION, Apply 3 drops topically daily. Bioidentical estradiol and testosterone compounded solution   zolpidem (AMBIEN) 5 MG tablet, TAKE 1 TABLET BY MOUTH AT BEDTIME AS NEEDED FOR SLEEP   Reviewed prior external information including notes and imaging from  primary care provider As well as notes that were available from care everywhere and other healthcare systems.  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  Past medical history, social, surgical and family history all reviewed in electronic medical record.  [name] pertanent information unless stated regarding to the chief complaint.   Review of Systems:  [name] headache, visual changes, nausea, vomiting, diarrhea, constipation, dizziness, abdominal pain, skin rash, fevers, chills, night sweats, weight loss, swollen lymph nodes,  joint swelling, chest pain, shortness of breath, mood changes. POSITIVE muscle aches, body aches  Objective  Blood pressure 124/88, pulse 84, height '5\' 4"'$  (1.626 m), weight 119 lb (54 kg), SpO2 99 %.   General: [name] apparent distress alert and oriented x3 mood and affect normal, dressed appropriately.  HEENT: Pupils equal, extraocular movements intact  Respiratory: Patient's speak in full sentences and does not appear short of breath  Cardiovascular: [name] lower extremity edema, non tender, [name] erythema  Back exam does have some loss lordosis.  Some tenderness to palpation noted.  Patient does have relatively poor weakness noted compared to patient's baseline from years ago.   Osteopathic findings C2 flexed rotated and side bent right C6 flexed rotated and side bent right T3 extended rotated and side bent right  inhaled third rib T9 extended rotated and side bent left L2 flexed rotated and side bent right Sacrum right on right    Impression and Recommendations:    The above documentation has been reviewed and is accurate and complete Lyndal Pulley, DO

## 2022-11-04 ENCOUNTER — Ambulatory Visit (INDEPENDENT_AMBULATORY_CARE_PROVIDER_SITE_OTHER): Payer: 59 | Admitting: Family Medicine

## 2022-11-04 ENCOUNTER — Ambulatory Visit (INDEPENDENT_AMBULATORY_CARE_PROVIDER_SITE_OTHER): Payer: 59

## 2022-11-04 ENCOUNTER — Other Ambulatory Visit (HOSPITAL_COMMUNITY): Payer: Self-pay

## 2022-11-04 VITALS — BP 124/88 | HR 84 | Ht 64.0 in | Wt 119.0 lb

## 2022-11-04 DIAGNOSIS — M9901 Segmental and somatic dysfunction of cervical region: Secondary | ICD-10-CM

## 2022-11-04 DIAGNOSIS — M9903 Segmental and somatic dysfunction of lumbar region: Secondary | ICD-10-CM

## 2022-11-04 DIAGNOSIS — M999 Biomechanical lesion, unspecified: Secondary | ICD-10-CM | POA: Diagnosis not present

## 2022-11-04 DIAGNOSIS — M545 Low back pain, unspecified: Secondary | ICD-10-CM | POA: Diagnosis not present

## 2022-11-04 DIAGNOSIS — M9904 Segmental and somatic dysfunction of sacral region: Secondary | ICD-10-CM | POA: Diagnosis not present

## 2022-11-04 DIAGNOSIS — M9908 Segmental and somatic dysfunction of rib cage: Secondary | ICD-10-CM | POA: Diagnosis not present

## 2022-11-04 DIAGNOSIS — M9902 Segmental and somatic dysfunction of thoracic region: Secondary | ICD-10-CM

## 2022-11-04 DIAGNOSIS — M5136 Other intervertebral disc degeneration, lumbar region: Secondary | ICD-10-CM | POA: Diagnosis not present

## 2022-11-04 MED ORDER — REPATHA SURECLICK 140 MG/ML ~~LOC~~ SOAJ
1.0000 mL | SUBCUTANEOUS | 3 refills | Status: DC
  Filled 2022-11-04 – 2022-11-24 (×4): qty 6, 84d supply, fill #0
  Filled 2023-02-03: qty 6, 84d supply, fill #1
  Filled 2023-04-23: qty 6, 84d supply, fill #2
  Filled 2023-08-27: qty 6, 84d supply, fill #3

## 2022-11-04 NOTE — Assessment & Plan Note (Signed)

## 2022-11-04 NOTE — Assessment & Plan Note (Addendum)
DDD mild overall. Discussed HEP  Discussed which activities to do and which ones to avoid.  Discussed with patient about different treatment options.  Patient responded well to osteopathic manipulation.  Home exercises given.  Discussed protein supplementation and diet as well.  Will get x-rays to further evaluate for any bony abnormality.  Will follow-up again in 6 to 8 weeks

## 2022-11-04 NOTE — Telephone Encounter (Signed)
Pharmacy Patient Advocate Encounter  Prior Authorization for Repatha '140MG'$ /ML has been approved.    KEY# [id number]  Effective dates: 2.23.24 through 11/04/23

## 2022-11-04 NOTE — Patient Instructions (Addendum)
LBP exercises Tart cherry extract '1200mg'$  at night Whey protein isolate Xray lumbar See me in 6-8 weeks

## 2022-11-05 ENCOUNTER — Encounter: Payer: Self-pay | Admitting: Family Medicine

## 2022-11-07 ENCOUNTER — Other Ambulatory Visit (HOSPITAL_COMMUNITY): Payer: Self-pay

## 2022-11-07 ENCOUNTER — Encounter: Payer: Self-pay | Admitting: Radiation Oncology

## 2022-11-08 ENCOUNTER — Other Ambulatory Visit (HOSPITAL_COMMUNITY): Payer: Self-pay

## 2022-11-09 ENCOUNTER — Encounter: Payer: Self-pay | Admitting: Internal Medicine

## 2022-11-14 DIAGNOSIS — Z79890 Hormone replacement therapy (postmenopausal): Secondary | ICD-10-CM | POA: Diagnosis not present

## 2022-11-14 DIAGNOSIS — Z90710 Acquired absence of both cervix and uterus: Secondary | ICD-10-CM | POA: Diagnosis not present

## 2022-11-14 DIAGNOSIS — Z01419 Encounter for gynecological examination (general) (routine) without abnormal findings: Secondary | ICD-10-CM | POA: Diagnosis not present

## 2022-11-15 ENCOUNTER — Other Ambulatory Visit (HOSPITAL_COMMUNITY): Payer: Self-pay

## 2022-11-16 ENCOUNTER — Other Ambulatory Visit: Payer: Self-pay | Admitting: Physician Assistant

## 2022-11-17 ENCOUNTER — Other Ambulatory Visit (HOSPITAL_COMMUNITY): Payer: Self-pay

## 2022-11-17 MED ORDER — ESTRADIOL 0.1 MG/GM VA CREA
TOPICAL_CREAM | VAGINAL | 0 refills | Status: AC
  Filled 2022-11-17 – 2022-11-25 (×2): qty 42.5, 90d supply, fill #0

## 2022-11-18 ENCOUNTER — Other Ambulatory Visit (HOSPITAL_COMMUNITY): Payer: Self-pay

## 2022-11-24 ENCOUNTER — Encounter: Payer: Self-pay | Admitting: Radiation Oncology

## 2022-11-24 ENCOUNTER — Other Ambulatory Visit (HOSPITAL_COMMUNITY): Payer: Self-pay

## 2022-11-25 ENCOUNTER — Other Ambulatory Visit (HOSPITAL_COMMUNITY): Payer: Self-pay

## 2022-11-29 ENCOUNTER — Other Ambulatory Visit: Payer: Self-pay

## 2022-11-29 DIAGNOSIS — C090 Malignant neoplasm of tonsillar fossa: Secondary | ICD-10-CM

## 2022-11-30 ENCOUNTER — Other Ambulatory Visit (HOSPITAL_COMMUNITY): Payer: Self-pay

## 2022-11-30 DIAGNOSIS — R1310 Dysphagia, unspecified: Secondary | ICD-10-CM

## 2022-12-09 ENCOUNTER — Ambulatory Visit (HOSPITAL_COMMUNITY)
Admission: RE | Admit: 2022-12-09 | Discharge: 2022-12-09 | Disposition: A | Discharge status: Home / Self Care | Payer: 59 | Source: Ambulatory Visit | Attending: Physician Assistant | Admitting: Physician Assistant

## 2022-12-09 DIAGNOSIS — C090 Malignant neoplasm of tonsillar fossa: Secondary | ICD-10-CM | POA: Insufficient documentation

## 2022-12-09 DIAGNOSIS — R1313 Dysphagia, pharyngeal phase: Secondary | ICD-10-CM | POA: Insufficient documentation

## 2022-12-09 DIAGNOSIS — R1310 Dysphagia, unspecified: Secondary | ICD-10-CM

## 2022-12-09 NOTE — Progress Notes (Signed)
Modified Barium Swallow Study  Patient Details  Name: Jennifer [name] MRN: [id number] Date of Birth: 01/31/1961  Today's Date: 12/09/2022  Modified Barium Swallow completed.  Full report located under Chart Review in the Imaging Section.  History of Present Illness Pt is a 62 y/o female who presented for an outpatient modified barium swallow study. 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 Pt stated that, in the last six months, she has been advancing her diet to include advanced solids and that this is when she began to notice her symptoms. Pt reported sensation of her throat feeling narrow, globus sensation with tougher/more dry foods, and throat clearing during meals. Per the pt, she is most symptomatic with dry foods and tougher meats. Pt has been participating in outpatient SLP services for dysphagia and reported consistency with her HEP in the last 6-8 weeks.   Clinical Impression Pt presents with oropharyngeal dysphagia characterized by a pharyngeal delay and reduction in tongue base retraction, anterior hyoid excursion, pharyngeal stripping, epiglottic inversion, and laryngeal closure. She demonstrated vallecular, pyriform sinus, and posterior pharyngeal wall residue which was improved with secondary swallows or liquid washes. Penetration (PAS 2,3) was noted with larger boluses of thin liquids secondary to impairments in pharyngeal timing and laryngeal clsoure. Trace aspiration of penetrated thin liquids was intermittently noted and triggered throat clearing which propelled it above the vocal folds. Straw use resulted in larger amounts of penetrated material. Effortful swallows, use of a brief oral hold, and a chin tuck posture were all effective in eliminating penetration/aspiration when consecutive swallows were avoided. A  regular texture diet with thin liquids is recommended at this time with observance of swallowing precautions and continued participation in dysphagia treatment. Factors that may increase risk of adverse event in presence of aspiration Jennifer [name] & Jennifer [name] 2021):    Swallow Evaluation Recommendations Recommendations: PO diet PO Diet Recommendation: Regular;Thin liquids (Level 0) Liquid Administration via: Cup;No straw Medication Administration: Whole meds with liquid Swallowing strategies  : Slow rate;Small bites/sips;Follow solids with liquids (With liquids, pt may use oral hold, effortful swallow or chin tuck per her preference) Postural changes: Position pt fully upright for meals Oral care recommendations: Oral care BID (2x/day)    Jennifer [name] I. [name], [name], [name] Office number [place] 12/09/2022,3:08 PM

## 2022-12-16 ENCOUNTER — Telehealth: Payer: Self-pay | Admitting: Adult Health

## 2022-12-16 ENCOUNTER — Encounter: Payer: Self-pay | Admitting: *Deleted

## 2023-01-13 ENCOUNTER — Ambulatory Visit: Payer: 59 | Admitting: Family Medicine

## 2023-01-26 ENCOUNTER — Ambulatory Visit: Payer: 59 | Admitting: Family Medicine

## 2023-01-27 ENCOUNTER — Ambulatory Visit: Payer: 59 | Admitting: Family Medicine

## 2023-02-01 DIAGNOSIS — C099 Malignant neoplasm of tonsil, unspecified: Secondary | ICD-10-CM | POA: Diagnosis not present

## 2023-02-03 ENCOUNTER — Other Ambulatory Visit (HOSPITAL_COMMUNITY): Payer: Self-pay

## 2023-02-03 ENCOUNTER — Encounter: Payer: Self-pay | Admitting: Radiation Oncology

## 2023-02-10 ENCOUNTER — Other Ambulatory Visit: Payer: Self-pay | Admitting: Physician Assistant

## 2023-02-10 DIAGNOSIS — Z1231 Encounter for screening mammogram for malignant neoplasm of breast: Secondary | ICD-10-CM

## 2023-02-17 ENCOUNTER — Ambulatory Visit (INDEPENDENT_AMBULATORY_CARE_PROVIDER_SITE_OTHER): Payer: 59 | Admitting: Physician Assistant

## 2023-02-17 ENCOUNTER — Encounter: Payer: Self-pay | Admitting: Physician Assistant

## 2023-02-17 VITALS — BP 120/80 | HR 72 | Temp 97.5°F | Ht 64.0 in | Wt 118.5 lb

## 2023-02-17 DIAGNOSIS — Z0000 Encounter for general adult medical examination without abnormal findings: Secondary | ICD-10-CM

## 2023-02-17 DIAGNOSIS — Z79890 Hormone replacement therapy (postmenopausal): Secondary | ICD-10-CM | POA: Diagnosis not present

## 2023-02-17 DIAGNOSIS — E8940 Asymptomatic postprocedural ovarian failure: Secondary | ICD-10-CM | POA: Diagnosis not present

## 2023-02-17 DIAGNOSIS — F5104 Psychophysiologic insomnia: Secondary | ICD-10-CM | POA: Diagnosis not present

## 2023-02-17 DIAGNOSIS — R931 Abnormal findings on diagnostic imaging of heart and coronary circulation: Secondary | ICD-10-CM

## 2023-02-17 DIAGNOSIS — C099 Malignant neoplasm of tonsil, unspecified: Secondary | ICD-10-CM | POA: Diagnosis not present

## 2023-02-17 LAB — CBC WITH DIFFERENTIAL/PLATELET
Basophils Absolute: 0 10*3/uL (ref 0.0–0.1)
Basophils Relative: 1.3 % (ref 0.0–3.0)
Eosinophils Absolute: 0.1 10*3/uL (ref 0.0–0.7)
Eosinophils Relative: 4.1 % (ref 0.0–5.0)
HCT: 45.4 % (ref 36.0–46.0)
Hemoglobin: 15 g/dL (ref 12.0–15.0)
Lymphocytes Relative: 33.8 % (ref 12.0–46.0)
Lymphs Abs: 1 10*3/uL (ref 0.7–4.0)
MCHC: 33 g/dL (ref 30.0–36.0)
MCV: 91.5 fl (ref 78.0–100.0)
Monocytes Absolute: 0.4 10*3/uL (ref 0.1–1.0)
Monocytes Relative: 15.6 % — ABNORMAL HIGH (ref 3.0–12.0)
Neutro Abs: 1.3 10*3/uL — ABNORMAL LOW (ref 1.4–7.7)
Neutrophils Relative %: 45.2 % (ref 43.0–77.0)
Platelets: 238 10*3/uL (ref 150.0–400.0)
RBC: 4.96 Mil/uL (ref 3.87–5.11)
RDW: 12.6 % (ref 11.5–15.5)
WBC: 2.9 10*3/uL — ABNORMAL LOW (ref 4.0–10.5)

## 2023-02-17 LAB — COMPREHENSIVE METABOLIC PANEL
ALT: 16 U/L (ref 0–35)
AST: 22 U/L (ref 0–37)
Albumin: 4.3 g/dL (ref 3.5–5.2)
Alkaline Phosphatase: 50 U/L (ref 39–117)
BUN: 13 mg/dL (ref 6–23)
CO2: 30 mEq/L (ref 19–32)
Calcium: 9.3 mg/dL (ref 8.4–10.5)
Chloride: 103 mEq/L (ref 96–112)
Creatinine, Ser: 0.85 mg/dL (ref 0.40–1.20)
GFR: 74.93 mL/min (ref >60.00)
Glucose, Bld: 88 mg/dL (ref 70–99)
Potassium: 4.1 mEq/L (ref 3.5–5.1)
Sodium: 141 mEq/L (ref 135–145)
Total Bilirubin: 0.6 mg/dL (ref 0.2–1.2)
Total Protein: 6.5 g/dL (ref 6.0–8.3)

## 2023-02-17 LAB — LIPID PANEL
Cholesterol: 176 mg/dL (ref 0–200)
HDL: 91.8 mg/dL (ref >39.00)
LDL Cholesterol: 73 mg/dL (ref 0–99)
NonHDL: 84.05
Total CHOL/HDL Ratio: 2
Triglycerides: 53 mg/dL (ref 0.0–149.0)
VLDL: 10.6 mg/dL (ref 0.0–40.0)

## 2023-02-17 LAB — TSH: TSH: 5.55 u[IU]/mL — ABNORMAL HIGH (ref 0.35–5.50)

## 2023-02-17 MED ORDER — ZOLPIDEM TARTRATE 5 MG PO TABS: ORAL_TABLET | Freq: Every evening | ORAL | 0 refills | Status: AC | PRN

## 2023-02-17 NOTE — Progress Notes (Signed)
Subjective:    Jennifer [name] is a 62 y.o. female and is here for a comprehensive physical exam.  HPI  Health Maintenance Due  Topic Date Due   DTaP/Tdap/Td (1 - Tdap) Never done    Acute Concerns: Insomnia Patient is requesting a refill on 5 mg ambien. She states that she hasn't used it in a year, but likes to keep some on standby.  Chronic Issues: Menopause Patient is complaint with 0.1 mg/gm estrace vaginal cream with no issues. She also states that she wants to wean off her progesterone ex drops.  Hyperlipidemia  Patient is complaint with 140 mg/ml repatha sureclick with no issues.   Tonsil cancer In remission; doing well Planning FEES next week, having some dysphagia  Health Maintenance: Colonoscopy -- last completed on 06/09/2016. Mammogram -- last completed on 01/06/2022. Another scheduled by the end of the month. Bone Density -- last completed on 10/01/2021. Diet -- She maintains a fair diet. Exercise -- She states that she participates in regular exercise.  Sleep habits -- Patient reports no trouble sleeping. Mood -- She states that she is in a stable mood this visit.  UTD with dentist? - She is UTD on dental care. UTD with eye doctor? - She is UTD on vision care.   Weight history: Wt Readings from Last 10 Encounters:  02/17/23 118 lb 8 oz (53.8 kg)  11/04/22 119 lb (54 kg)  08/23/22 119 lb (54 kg)  08/08/22 118 lb 9.6 oz (53.8 kg)  07/08/22 118 lb 9.6 oz (53.8 kg)  06/30/22 119 lb (54 kg)  05/23/22 118 lb 4 oz (53.6 kg)  03/14/22 123 lb 4 oz (55.9 kg)  01/27/22 129 lb 3.2 oz (58.6 kg)  01/07/22 127 lb (57.6 kg)   Body mass index is 20.34 kg/m. No LMP recorded. Patient has had a hysterectomy.  Alcohol use:  reports current alcohol use.  Tobacco use:  Tobacco Use: Medium Risk (02/17/2023)   Patient History    Smoking Tobacco Use: Former    Smokeless Tobacco Use: Never    Passive Exposure: Not on file   Eligible for lung cancer screening? no      02/17/2023    9:54 AM  Depression screen PHQ 2/9  Decreased Interest 0  Down, Depressed, Hopeless 0  PHQ - 2 Score 0  Altered sleeping 0  Tired, decreased energy 0  Change in appetite 0  Feeling bad or failure about yourself  0  Trouble concentrating 0  Moving slowly or fidgety/restless 0  Suicidal thoughts 0  PHQ-9 Score 0  Difficult doing work/chores Not difficult at all     Other providers/specialists: Patient Care Team: Jarold Motto, [place] as PCP - General (Physician Assistant) Maisie Fus, MD as PCP - Cardiology (Cardiology) [place], Lise Auer, RN as Oncology Nurse Navigator Lonie Peak, MD as Consulting Physician (Radiation Oncology) Serena Colonel, MD as Consulting Physician (Otolaryngology) Corey Skains, MD as Consulting Physician (Otolaryngology)    PMHx, SurgHx, SocialHx, Medications, and Allergies were reviewed in the Visit Navigator and updated as appropriate.   Past Medical History:  Diagnosis Date   Anemia    Bowel obstruction (HCC)    Cancer (HCC) 12-16-2021   metastatic squamous cell carcimoma   Fibroids    GERD (gastroesophageal reflux disease)    H/O small bowel obstruction    Hyperlipidemia    no meds, diet controlled   Restless legs syndrome (RLS)      Past Surgical History:  Procedure Laterality Date  ABDOMINAL HYSTERECTOMY     DIAGNOSTIC LAPAROSCOPY     x several with lysis of adhesions   EXPLORATORY LAPAROTOMY     had bowel obstruction   EYE SURGERY Bilateral    Lasik   LARYNGOSCOPY AND ESOPHAGOSCOPY N/A 01/07/2022   Procedure: DIRECT LARYNGOSCOPY WITH BIOPSY; ESOPHAGOSCOPY; FROZEN SECTIONS;  Surgeon: Serena Colonel, MD;  Location: [facility];  Service: ENT;  Laterality: N/A;   ROTATOR CUFF REPAIR Right 2017   SMALL INTESTINE SURGERY  [id number]   [facility] - Dr Celene Kras     Family History  Problem Relation Age of Onset   Ovarian cancer Mother    Early death Mother    Diabetes Father    Hyperlipidemia Father    Heart disease  Father    Cancer - Other Maternal Grandmother        Lymphatic   Breast cancer Paternal Grandmother    COPD Paternal Grandfather    Breast cancer Maternal Aunt     Social History   Tobacco Use   Smoking status: Former    Packs/day: 0.25    Years: 3.00    Additional pack years: 0.00    Total pack years: 0.75    Types: Cigarettes    Quit date: 09/13/1987    Years since quitting: 35.4   Smokeless tobacco: Never   Tobacco comments:    smoked 30 years ago in college  Vaping Use   Vaping Use: Never used  Substance Use Topics   Alcohol use: Yes    Comment: Occasionally   Drug use: No    Review of Systems:   Review of Systems  Constitutional:  Negative for chills, fever, malaise/fatigue and weight loss.  HENT:  Negative for hearing loss, sinus pain and sore throat.   Respiratory:  Negative for cough and hemoptysis.   Cardiovascular:  Negative for chest pain, palpitations, leg swelling and PND.  Gastrointestinal:  Negative for abdominal pain, constipation, diarrhea, heartburn, nausea and vomiting.  Genitourinary:  Negative for dysuria, frequency and urgency.  Musculoskeletal:  Negative for back pain, myalgias and neck pain.  Skin:  Negative for itching and rash.  Endo/Heme/Allergies:  Negative for polydipsia.  Psychiatric/Behavioral:  Negative for depression. The patient is not nervous/anxious.     Objective:   BP 120/80 (BP Location: Left Arm, Patient Position: Sitting, Cuff Size: Normal)   Pulse 72   Temp (!) 97.5 F (36.4 C) (Temporal)   Ht 5\' 4"  (1.626 m)   Wt 118 lb 8 oz (53.8 kg)   SpO2 98%   BMI 20.34 kg/m  Body mass index is 20.34 kg/m.   General Appearance:    Alert, cooperative, no distress, appears stated age  Head:    Normocephalic, without obvious abnormality, atraumatic  Eyes:    PERRL, conjunctiva/corneas clear, EOM's intact, fundi    benign, both eyes  Ears:    Normal TM's and external ear canals, both ears  Nose:   Nares normal, septum midline,  mucosa normal, no drainage    or sinus tenderness  Throat:   Lips, mucosa, and tongue normal; teeth and gums normal  Neck:   Supple, symmetrical, trachea midline, no adenopathy;    thyroid:  no enlargement/tenderness/nodules; no carotid   bruit or JVD  Back:     Symmetric, no curvature, ROM normal, no CVA tenderness  Lungs:     Clear to auscultation bilaterally, respirations unlabored  Chest Wall:    No tenderness or deformity   Heart:    Regular  rate and rhythm, S1 and S2 normal, no murmur, rub or gallop  Breast Exam:    Deferred  Abdomen:     Soft, non-tender, bowel sounds active all four quadrants,    no masses, no organomegaly  Genitalia:    Deferred  Extremities:   Extremities normal, atraumatic, no cyanosis or edema  Pulses:   2+ and symmetric all extremities  Skin:   Skin color, texture, turgor normal, no rashes or lesions  Lymph nodes:   Cervical, supraclavicular, and axillary nodes normal  Neurologic:   CNII-XII intact, normal strength, sensation and reflexes    throughout    Assessment/Plan:   Routine physical examination Today patient counseled on age appropriate routine health concerns for screening and prevention, each reviewed and up to date or declined. Immunizations reviewed and up to date or declined. Labs ordered and reviewed. Risk factors for depression reviewed and negative. Hearing function and visual acuity are intact. ADLs screened and addressed as needed. Functional ability and level of safety reviewed and appropriate. Education, counseling and referrals performed based on assessed risks today. Patient provided with a copy of personalized plan for preventive services.  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  I, Jarold Motto, [place], have reviewed all documentation for this visit. The documentation on 02/17/23 for the exam, diagnosis, procedures, and orders are all accurate and complete.   Jarold Motto, PA-C [facility]

## 2023-02-17 NOTE — Patient Instructions (Signed)
It was great to see you! ? ?Please go to the lab for blood work.  ? ?Our office will call you with your results unless you have chosen to receive results via MyChart. ? ?If your blood work is normal we will follow-up each year for physicals and as scheduled for chronic medical problems. ? ?If anything is abnormal we will treat accordingly and get you in for a follow-up. ? ?Take care, ? ?Frady Taddeo ?  ? ? ?

## 2023-02-20 ENCOUNTER — Other Ambulatory Visit: Payer: Self-pay | Admitting: Physician Assistant

## 2023-02-20 DIAGNOSIS — D709 Neutropenia, unspecified: Secondary | ICD-10-CM

## 2023-02-20 DIAGNOSIS — R7989 Other specified abnormal findings of blood chemistry: Secondary | ICD-10-CM

## 2023-02-21 ENCOUNTER — Other Ambulatory Visit (INDEPENDENT_AMBULATORY_CARE_PROVIDER_SITE_OTHER): Payer: 59

## 2023-02-21 ENCOUNTER — Encounter: Payer: Self-pay | Admitting: Pharmacist

## 2023-02-21 DIAGNOSIS — R1312 Dysphagia, oropharyngeal phase: Secondary | ICD-10-CM | POA: Diagnosis not present

## 2023-02-21 DIAGNOSIS — D709 Neutropenia, unspecified: Secondary | ICD-10-CM

## 2023-02-21 DIAGNOSIS — R7989 Other specified abnormal findings of blood chemistry: Secondary | ICD-10-CM | POA: Diagnosis not present

## 2023-02-21 DIAGNOSIS — Z8589 Personal history of malignant neoplasm of other organs and systems: Secondary | ICD-10-CM | POA: Diagnosis not present

## 2023-02-21 DIAGNOSIS — R1310 Dysphagia, unspecified: Secondary | ICD-10-CM | POA: Diagnosis not present

## 2023-02-21 DIAGNOSIS — Z08 Encounter for follow-up examination after completed treatment for malignant neoplasm: Secondary | ICD-10-CM | POA: Diagnosis not present

## 2023-02-21 DIAGNOSIS — Z09 Encounter for follow-up examination after completed treatment for conditions other than malignant neoplasm: Secondary | ICD-10-CM | POA: Diagnosis not present

## 2023-02-21 DIAGNOSIS — C099 Malignant neoplasm of tonsil, unspecified: Secondary | ICD-10-CM | POA: Diagnosis not present

## 2023-02-21 LAB — CBC WITH DIFFERENTIAL/PLATELET
Basophils Absolute: 0 10*3/uL (ref 0.0–0.1)
Basophils Relative: 1.3 % (ref 0.0–3.0)
Eosinophils Absolute: 0.1 10*3/uL (ref 0.0–0.7)
Eosinophils Relative: 3.2 % (ref 0.0–5.0)
HCT: 43.9 % (ref 36.0–46.0)
Hemoglobin: 14.5 g/dL (ref 12.0–15.0)
Lymphocytes Relative: 24.4 % (ref 12.0–46.0)
Lymphs Abs: 0.8 10*3/uL (ref 0.7–4.0)
MCHC: 33 g/dL (ref 30.0–36.0)
MCV: 91.2 fl (ref 78.0–100.0)
Monocytes Absolute: 0.5 10*3/uL (ref 0.1–1.0)
Monocytes Relative: 16 % — ABNORMAL HIGH (ref 3.0–12.0)
Neutro Abs: 1.7 10*3/uL (ref 1.4–7.7)
Neutrophils Relative %: 55.1 % (ref 43.0–77.0)
Platelets: 258 10*3/uL (ref 150.0–400.0)
RBC: 4.81 Mil/uL (ref 3.87–5.11)
RDW: 12.7 % (ref 11.5–15.5)
WBC: 3.2 10*3/uL — ABNORMAL LOW (ref 4.0–10.5)

## 2023-02-21 LAB — T3, FREE: T3, Free: 3.2 pg/mL (ref 2.3–4.2)

## 2023-02-21 LAB — T4, FREE: Free T4: 0.59 ng/dL — ABNORMAL LOW (ref 0.60–1.60)

## 2023-02-21 LAB — TSH: TSH: 5.25 u[IU]/mL (ref 0.35–5.50)

## 2023-02-23 ENCOUNTER — Telehealth: Payer: Self-pay | Admitting: Adult Health

## 2023-02-23 NOTE — Telephone Encounter (Signed)
Returning call per voicemail, patient is aware of rescheduled appointment date/times.

## 2023-03-02 ENCOUNTER — Encounter: Payer: 59 | Admitting: Adult Health

## 2023-03-02 DIAGNOSIS — Z09 Encounter for follow-up examination after completed treatment for conditions other than malignant neoplasm: Secondary | ICD-10-CM | POA: Diagnosis not present

## 2023-03-02 DIAGNOSIS — K224 Dyskinesia of esophagus: Secondary | ICD-10-CM | POA: Diagnosis not present

## 2023-03-02 DIAGNOSIS — R1310 Dysphagia, unspecified: Secondary | ICD-10-CM | POA: Diagnosis not present

## 2023-03-02 DIAGNOSIS — C099 Malignant neoplasm of tonsil, unspecified: Secondary | ICD-10-CM | POA: Diagnosis not present

## 2023-03-10 ENCOUNTER — Ambulatory Visit
Admission: RE | Admit: 2023-03-10 | Discharge: 2023-03-10 | Disposition: A | Discharge status: Home / Self Care | Payer: 59 | Source: Ambulatory Visit

## 2023-03-10 DIAGNOSIS — Z1231 Encounter for screening mammogram for malignant neoplasm of breast: Secondary | ICD-10-CM | POA: Diagnosis not present

## 2023-03-17 ENCOUNTER — Telehealth: Payer: Self-pay | Admitting: Adult Health

## 2023-03-17 NOTE — Telephone Encounter (Signed)
Left patient a vm regarding upcoming appointment change  

## 2023-03-22 ENCOUNTER — Other Ambulatory Visit: Payer: Self-pay | Admitting: Oncology

## 2023-03-22 DIAGNOSIS — Z006 Encounter for examination for normal comparison and control in clinical research program: Secondary | ICD-10-CM

## 2023-03-24 ENCOUNTER — Inpatient Hospital Stay: Payer: 59 | Attending: Adult Health | Admitting: Adult Health

## 2023-03-24 ENCOUNTER — Encounter: Payer: Self-pay | Admitting: Adult Health

## 2023-03-24 ENCOUNTER — Other Ambulatory Visit: Payer: Self-pay

## 2023-03-24 ENCOUNTER — Other Ambulatory Visit (HOSPITAL_COMMUNITY)
Admission: RE | Admit: 2023-03-24 | Discharge: 2023-03-24 | Disposition: A | Discharge status: Home / Self Care | Payer: 59 | Attending: Oncology | Admitting: Oncology

## 2023-03-24 ENCOUNTER — Encounter: Payer: 59 | Admitting: Adult Health

## 2023-03-24 VITALS — BP 111/70 | HR 83 | Temp 97.5°F | Resp 17 | Ht 64.0 in | Wt 118.6 lb

## 2023-03-24 DIAGNOSIS — R1310 Dysphagia, unspecified: Secondary | ICD-10-CM | POA: Insufficient documentation

## 2023-03-24 DIAGNOSIS — Z85818 Personal history of malignant neoplasm of other sites of lip, oral cavity, and pharynx: Secondary | ICD-10-CM | POA: Insufficient documentation

## 2023-03-24 DIAGNOSIS — C090 Malignant neoplasm of tonsillar fossa: Secondary | ICD-10-CM | POA: Diagnosis not present

## 2023-03-24 DIAGNOSIS — Z006 Encounter for examination for normal comparison and control in clinical research program: Secondary | ICD-10-CM | POA: Insufficient documentation

## 2023-03-24 DIAGNOSIS — Z923 Personal history of irradiation: Secondary | ICD-10-CM | POA: Insufficient documentation

## 2023-03-24 NOTE — Progress Notes (Signed)
CLINIC:  Survivorship  REASON FOR VISIT:  Routine follow-up for history of head & neck cancer.  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 All margins negative for invasive carcinoma by at least 2mm. Nodal status of 3/20 dissected lymph nodes (levels 2A, 3, and 4) positive for metastatic carcinoma, and 1/1 level 2B lymph node negative for malignancy.   No ECE.    03/14/2022 Cancer Staging   Staging form: Pharynx - HPV-Mediated Oropharynx, AJCC 8th Edition - Pathologic stage from 03/14/2022: Stage I (pT1, pN1, cM0, p16+) - Signed by Lonie Peak, MD on 03/14/2022 Stage prefix: Initial diagnosis   03/30/2022 - 05/10/2022 Radiation Therapy   Site Technique Total Dose (Gy) Dose per Fx (Gy) Completed Fx Beam Energies  Neck: HN_Ltonsil IMRT 60/60 2 30/30 6X        INTERVAL HISTORY:  Jennifer [name] is here today for her initial survivorship visit.  She was recently seen by ENT-Dr. Hezzie Bump on 02/01/2023.  She was seen by Dr. Basilio Cairo 08/2022.  She is scheduled for CT neck and chest in September 2024.    Jennifer [name] was referred to Dr. Rubye Oaks, an ENT specialist at [facility] in [place].  She underwent FEES and a modified barium swallow.  This determined that dilation would not help her dysphagia, the radiation side effects on the muscles in her throat have created a narrowing feeling, but there is no narrowing to fix due to the radiation fibrosis. She is now seeing the [facility] SLP and has an appointment coming up soon.   She sees the dentist every 6 months.  Her weight is stable, she has no weight loss.  She still has some weird pain at the site of her incision.  This pain is intermittent and the frequency she experiences it is becoming less.    ADDITIONAL REVIEW OF SYSTEMS:  Review of Systems   Constitutional:  Negative for appetite change, chills, fatigue, fever and unexpected weight change.  HENT:   Positive for trouble swallowing. Negative for hearing loss, lump/mass, mouth sores, sore throat and voice change.   Eyes:  Negative for eye problems and icterus.  Respiratory:  Negative for chest tightness, cough and shortness of breath.   Cardiovascular:  Negative for chest pain, leg swelling and palpitations.  Gastrointestinal:  Negative for abdominal distention, abdominal pain, constipation, diarrhea, nausea and vomiting.  Endocrine: Negative for hot flashes.  Genitourinary:  Negative for difficulty urinating.   Musculoskeletal:  Negative for arthralgias.  Skin:  Negative for itching and rash.  Neurological:  Negative for dizziness, extremity weakness, headaches and numbness.  Hematological:  Negative for adenopathy. Does not bruise/bleed easily.  Psychiatric/Behavioral:  Negative for depression. The patient is not nervous/anxious.        CURRENT MEDICATIONS:  Current Outpatient Medications on File Prior to Visit  Medication Sig Dispense Refill   aspirin EC 81 MG tablet Take 81 mg by mouth daily.     calcium elemental as carbonate (BARIATRIC TUMS ULTRA) 400 MG chewable tablet Chew 1,000 mg by mouth daily.     cholecalciferol (VITAMIN D3) 25 MCG (1000 UNIT) tablet Take 1,000 Units by mouth 3 (three) times a week.     estradiol (ESTRACE) 0.1 MG/GM vaginal cream Apply intravaginally once daily for 2 weeks, then decrease to 1 to 3 times weekly 42.5 g 0  Evolocumab (REPATHA SURECLICK) 140 MG/ML SOAJ Inject 140 mg into the skin every 14 (fourteen) days. 6 mL 3   ferrous sulfate 325 (65 FE) MG tablet Take 325 mg by mouth 3 (three) times a week.      ibuprofen (ADVIL,MOTRIN) 200 MG tablet Take 400 mg by mouth every 6 (six) hours as needed for mild pain.     Misc Natural Products (PROGESTERONE EX) Apply 5 drops topically at bedtime. Bioidentical progesterone compounded solution      PRESCRIPTION MEDICATION Apply 3 drops topically daily. Bioidentical estradiol and testosterone compounded solution     zolpidem (AMBIEN) 5 MG tablet TAKE 1 TABLET BY MOUTH AT BEDTIME AS NEEDED FOR SLEEP 10 tablet 0   No current facility-administered medications on file prior to visit.    ALLERGIES:  Allergies  Allergen Reactions   Atorvastatin Other (See Comments)    myalgias   Codeine Nausea And Vomiting   Oxycodone Nausea And Vomiting   Zetia [Ezetimibe] Other (See Comments)    myalgias     PHYSICAL EXAM:  Vitals:   03/24/23 1428  BP: 111/70  Pulse: 83  Resp: 17  Temp: (!) 97.5 F (36.4 C)  SpO2: 99%   Filed Weights   03/24/23 1428  Weight: 118 lb 9 oz (53.8 kg)     General: Well-nourished, well-appearing female in no acute distress.  Accompanied/Unaccompanied today.  HEENT: Head is atraumatic and normocephalic.  Conjunctivae clear without exudate.  Sclerae anicteric. Oral mucosa is pink and moist without lesions.  Tongue pink, moist, and midline. Oropharynx is pink and moist, without lesions. Lymph: No preauricular, postauricular, cervical, supraclavicular, or infraclavicular lymphadenopathy noted on palpation.   Neck: No palpable masses. No swelling or thyromegaly noted Cardiovascular: Normal rate and rhythm. Respiratory: Clear to auscultation bilaterally. Chest expansion symmetric without accessory muscle use; breathing non-labored.  GI: Abdomen soft and round. Non-tender, non-distended. Bowel sounds normoactive.  GU: Deferred.   Neuro: No focal deficits. Steady gait.   Psych: Normal mood and affect for situation. Extremities: No edema.  Skin: Warm and dry.    LABORATORY DATA:  None at this visit.  DIAGNOSTIC IMAGING:  None at this visit.    ASSESSMENT & PLAN:  Ms. Jennifer [name] is a pleasant 62 y.o. 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  Patient presents to survivorship clinic today for routine follow-up after finishing treatment.   1. Squamous cell carcinoma of the tonsillary fossa:  Ms. Jennifer [name] is clinically without evidence of disease or recurrence on physical exam today.   I reviewed her SCP with her today along with supportive care resources for her here at the cancer center.  She will undergo CT neck and chest in September as ordered with Dr. Basilio Cairo and will f/u with Dr. Basilio Cairo and Dr. Hezzie Bump after as planned.   2. Nutritional status: Ms. Kenerson reports that she is currently able to consume adequate nutrition by mouth.  Weight is stable.  Encouraged to continue to consume adequate hydration and nutrition, as tolerated.    3. Dysphagia:She is following with SLP at [facility].    4.  At risk for hypothyroidism: With borderline thyroid studies, recommend repeat TSH and free T4 in 3 months.    5. At risk for tooth decay/dental concerns: She is seeing her dentist regularly.  She has no dental concerns.    6. Health maintenance and wellness promotion: Cancer patients who  consume a diet rich in fruits and vegetables have better overall health and decreased risk of cancer recurrence. Ms. Macmillan was encouraged to consume 5-7 servings of fruits and vegetables per day, as tolerated. Ms. Rosebush was also encouraged to engage in moderate to vigorous exercise for 30 minutes per day most days of the week.   7. Support services/counseling: It is not uncommon for this period of the patient's cancer care trajectory to be one of many emotions and stressors.   Ms. Seijo was encouraged to take advantage of our many support services programs, support groups, and/or counseling in coping with her new life as a cancer survivor after completing anti-cancer treatment.   Dispo:  -See Dr. Hezzie Bump in 06/2023 (ENT)  -Return to cancer center to see Dr. Basilio Cairo after CT neck that is scheduled 05/2023  -Return to cancer center to see Survivorship NP in 1  year.   Total encounter time:40 minutes*in face-to-face visit time, chart review, lab review, care coordination, order entry, and documentation of the encounter time.    Lillard Anes, NP 03/24/23 2:46 PM Medical Oncology and Hematology [facility] [address] [place], [place] [address] Tel. [phone]    Fax. [phone]  *Total Encounter Time as defined by the Centers for Medicare and Medicaid Services includes, in addition to the face-to-face time of a patient visit (documented in the note above) non-face-to-face time: obtaining and reviewing outside history, ordering and reviewing medications, tests or procedures, care coordination (communications with other health care professionals or caregivers) and documentation in the medical record.

## 2023-03-27 ENCOUNTER — Telehealth: Payer: Self-pay | Admitting: Adult Health

## 2023-03-27 NOTE — Telephone Encounter (Signed)
Scheduled appointment per 7/12 los. Patient is aware of the made appointment.

## 2023-03-30 DIAGNOSIS — R1310 Dysphagia, unspecified: Secondary | ICD-10-CM | POA: Diagnosis not present

## 2023-04-06 LAB — HELIX MOLECULAR SCREEN: Genetic Analysis Overall Interpretation: NEGATIVE

## 2023-04-07 DIAGNOSIS — L570 Actinic keratosis: Secondary | ICD-10-CM | POA: Diagnosis not present

## 2023-04-07 DIAGNOSIS — L578 Other skin changes due to chronic exposure to nonionizing radiation: Secondary | ICD-10-CM | POA: Diagnosis not present

## 2023-04-07 DIAGNOSIS — Z85828 Personal history of other malignant neoplasm of skin: Secondary | ICD-10-CM | POA: Diagnosis not present

## 2023-04-13 NOTE — Therapy (Signed)
[facility] [address] [place], [place], [address] Phone: [phone]   Fax:  [phone]  Patient Details  Name: Jennifer [name] MRN: [id number] Date of Birth: [birth year] Referring Provider:  Lonie Peak, MD  Encounter Date: 04/13/2023  SPEECH THERAPY DISCHARGE SUMMARY  Visits from Start of Care: 3  Current functional level related to goals / functional outcomes: Pt is receiving ST primary at [facility]. She underwent FEES at [facility] in June and saw [facility] SLP for ST in July. She will be d/c'd at this time due to voluntary primary ST at [facility]. Goals from her last attended session in February 2024 are below:   SHORT TERM GOALS: Target completion:  first 4 therapy sessions (overall visit #5) - modified due to time from previous session       pt will complete HEP with rare min A  Baseline: 10/20/22 Goal status: Ongoing   2.  pt will tell SLP why pt is completing HEP with modified independence Baseline:  Goal status: Met   3.  pt will describe 3 overt s/s aspiration PNA with modified independence Baseline:  Goal status: Ongoing   4.  pt will tell SLP how a food journal could hasten return to a more normalized diet Baseline:  Goal status: Ongoing     LONG TERM GOALS: Target: sixth ST session (overall visit #7)   pt will complete HEP with modified independence over two visits Baseline:  Goal status: Ongoing   2.  pt will describe how to modify HEP over time, and the timeline associated with reduction in HEP frequency with modified independence over two sessions Baseline:  Goal status: Ongoing Remaining deficits: Unknown, as this d/c is unexpected .   Education / Equipment: See therapy notes for details.    Patient agrees to discharge. Patient goals were partially met. Patient is being discharged due to not returning since the last visit.Marland Kitchen    Aylin Rhoads, CCC-SLP 04/13/2023, 2:42  PM  [place]  [facility] [address] [place], [place], [address] Phone: [phone]   Fax:  [phone]

## 2023-04-27 DIAGNOSIS — R1310 Dysphagia, unspecified: Secondary | ICD-10-CM | POA: Diagnosis not present

## 2023-04-27 DIAGNOSIS — R1312 Dysphagia, oropharyngeal phase: Secondary | ICD-10-CM | POA: Diagnosis not present

## 2023-05-12 ENCOUNTER — Telehealth: Payer: Self-pay | Admitting: *Deleted

## 2023-05-12 NOTE — Telephone Encounter (Signed)
RETURNED PATIENT'S PHONE CALL, SPOKE WITH PATIENT. ?

## 2023-05-16 ENCOUNTER — Encounter (HOSPITAL_BASED_OUTPATIENT_CLINIC_OR_DEPARTMENT_OTHER): Payer: Self-pay

## 2023-05-16 ENCOUNTER — Ambulatory Visit (HOSPITAL_BASED_OUTPATIENT_CLINIC_OR_DEPARTMENT_OTHER)
Admission: RE | Admit: 2023-05-16 | Discharge: 2023-05-16 | Disposition: A | Discharge status: Home / Self Care | Payer: 59 | Source: Ambulatory Visit | Attending: Radiation Oncology | Admitting: Radiation Oncology

## 2023-05-16 DIAGNOSIS — C099 Malignant neoplasm of tonsil, unspecified: Secondary | ICD-10-CM | POA: Insufficient documentation

## 2023-05-16 DIAGNOSIS — J929 Pleural plaque without asbestos: Secondary | ICD-10-CM | POA: Diagnosis not present

## 2023-05-16 DIAGNOSIS — R918 Other nonspecific abnormal finding of lung field: Secondary | ICD-10-CM | POA: Diagnosis not present

## 2023-05-16 DIAGNOSIS — R911 Solitary pulmonary nodule: Secondary | ICD-10-CM | POA: Diagnosis not present

## 2023-05-16 DIAGNOSIS — M799 Soft tissue disorder, unspecified: Secondary | ICD-10-CM | POA: Diagnosis not present

## 2023-05-16 MED ORDER — IOHEXOL 350 MG/ML SOLN
100.0000 mL | Freq: Once | INTRAVENOUS | Status: AC | PRN
  Administered 2023-05-16: 75 mL via INTRAVENOUS

## 2023-05-19 ENCOUNTER — Other Ambulatory Visit (HOSPITAL_COMMUNITY): Payer: Self-pay

## 2023-05-22 ENCOUNTER — Ambulatory Visit (HOSPITAL_COMMUNITY): Payer: Commercial Managed Care - PPO

## 2023-05-23 ENCOUNTER — Ambulatory Visit: Payer: Commercial Managed Care - PPO | Admitting: Radiation Oncology

## 2023-05-23 ENCOUNTER — Ambulatory Visit: Payer: Commercial Managed Care - PPO

## 2023-05-31 ENCOUNTER — Telehealth: Payer: Self-pay

## 2023-05-31 NOTE — Telephone Encounter (Signed)
RN left message for pt after a third failed attempt to reach pt. Rn again left call back information on this message.

## 2023-05-31 NOTE — Telephone Encounter (Signed)
Rn attempted to call pt again and left message. Rn attempting to reach pt to give her test results. Rn will call back this afternoon.

## 2023-05-31 NOTE — Telephone Encounter (Signed)
Rn left message for pt to call RN back. RN attempted to call pt to let her know about test results. RN will call pt back later.

## 2023-06-01 ENCOUNTER — Telehealth: Payer: Self-pay

## 2023-06-01 NOTE — Telephone Encounter (Signed)
RN attempted to call pt again and was unsuccessful. Pt called and left message for pt husband as well. Call back details left with message. Rn will await a call back from pt or family.

## 2023-06-01 NOTE — Telephone Encounter (Signed)
RN left message for pt after failed attempt to reach pt. Rn was calling pt to give her test results. Rn will attempt to call back later.

## 2023-06-06 ENCOUNTER — Ambulatory Visit: Payer: Commercial Managed Care - PPO

## 2023-06-09 ENCOUNTER — Ambulatory Visit: Payer: Self-pay | Admitting: Radiation Oncology

## 2023-06-09 ENCOUNTER — Ambulatory Visit: Payer: Commercial Managed Care - PPO

## 2023-06-22 DIAGNOSIS — H0100A Unspecified blepharitis right eye, upper and lower eyelids: Secondary | ICD-10-CM | POA: Diagnosis not present

## 2023-06-22 DIAGNOSIS — R1312 Dysphagia, oropharyngeal phase: Secondary | ICD-10-CM | POA: Diagnosis not present

## 2023-06-23 ENCOUNTER — Other Ambulatory Visit (HOSPITAL_COMMUNITY): Payer: Self-pay

## 2023-06-23 MED ORDER — AZITHROMYCIN 250 MG PO TABS
ORAL_TABLET | ORAL | 0 refills | Status: AC
  Filled 2023-06-23: qty 6, 5d supply, fill #0

## 2023-06-23 MED ORDER — ERYTHROMYCIN 5 MG/GM OP OINT
TOPICAL_OINTMENT | OPHTHALMIC | 1 refills | Status: DC
  Filled 2023-06-23: qty 3.5, 30d supply, fill #0

## 2023-06-26 ENCOUNTER — Telehealth: Payer: Self-pay

## 2023-06-26 NOTE — Telephone Encounter (Signed)
Rn reached out to pt about her follow up appointment on 06-30-23. Pt stated she was doing well and had no needs. She also stated she has a follow up with ENT as well. Her CT results were previously reviewed with her by phone. Pt did state she does not wish to go to her appointment with survivorship. She feels like this appointment was a disappointment and did not serve a purpose for her. She wishes to cancel this due to the time and money this appointment cost her last time (when she saw no benefit from it). Rn will give this feedback to Dr. Basilio Cairo and cancel her survivorship appointment unless Dr. Basilio Cairo can see an additional benefit for her. Rn cancelled her lab and follow up appointment. Pt also had a recent TSH drawn by PCP (this will be done yearly).

## 2023-06-30 ENCOUNTER — Ambulatory Visit: Payer: Commercial Managed Care - PPO | Admitting: Radiation Oncology

## 2023-06-30 ENCOUNTER — Ambulatory Visit: Payer: 59

## 2023-07-19 DIAGNOSIS — H0100A Unspecified blepharitis right eye, upper and lower eyelids: Secondary | ICD-10-CM | POA: Diagnosis not present

## 2023-07-19 DIAGNOSIS — H0100B Unspecified blepharitis left eye, upper and lower eyelids: Secondary | ICD-10-CM | POA: Diagnosis not present

## 2023-07-26 DIAGNOSIS — C099 Malignant neoplasm of tonsil, unspecified: Secondary | ICD-10-CM | POA: Diagnosis not present

## 2023-07-28 ENCOUNTER — Telehealth: Payer: Self-pay | Admitting: Internal Medicine

## 2023-07-28 NOTE — Telephone Encounter (Signed)
Pt c/o medication issue:  1. Name of Medication:   Evolocumab (REPATHA SURECLICK) 140 MG/ML SOAJ    2. How are you currently taking this medication (dosage and times per day)? As prescribed   3. Are you having a reaction (difficulty breathing--STAT)?   4. What is your medication issue? Patient is having pains in legs and believes it may be from medication. Please advise.

## 2023-07-28 NOTE — Telephone Encounter (Signed)
MyChart message sent to patient.

## 2023-07-28 NOTE — Telephone Encounter (Signed)
Agree with recommendation. She should hold 1 dose. See if any improvement and resume. Let us know if symptoms come back.

## 2023-07-28 NOTE — Telephone Encounter (Signed)
Spoke with patient of Dr. Wyline Mood (last seen 1 year ago). She has been on Repatha for months (unable to take statins). She reports for the last month, she reports a deep, "bad" pain in her legs.   She took a dose last week. Due next week. Advised that she HOLD next dose to see if he has resolution of symptoms. Advised will send to PharmD team for additional advice

## 2023-08-28 ENCOUNTER — Other Ambulatory Visit (HOSPITAL_COMMUNITY): Payer: Self-pay

## 2023-09-18 ENCOUNTER — Other Ambulatory Visit: Payer: Self-pay

## 2023-09-18 ENCOUNTER — Encounter: Payer: Self-pay | Admitting: Physician Assistant

## 2023-09-18 DIAGNOSIS — Z780 Asymptomatic menopausal state: Secondary | ICD-10-CM

## 2023-09-26 ENCOUNTER — Encounter: Payer: Self-pay | Admitting: Pharmacist

## 2023-10-03 ENCOUNTER — Telehealth: Payer: Self-pay | Admitting: Pharmacy Technician

## 2023-10-03 ENCOUNTER — Other Ambulatory Visit (HOSPITAL_COMMUNITY): Payer: Self-pay

## 2023-10-03 DIAGNOSIS — R931 Abnormal findings on diagnostic imaging of heart and coronary circulation: Secondary | ICD-10-CM

## 2023-10-03 DIAGNOSIS — E785 Hyperlipidemia, unspecified: Secondary | ICD-10-CM

## 2023-10-03 MED ORDER — PRALUENT 75 MG/ML ~~LOC~~ SOAJ
75.0000 mg | SUBCUTANEOUS | 2 refills | Status: DC
  Filled 2023-10-03: qty 2, 28d supply, fill #0
  Filled 2023-10-20 – 2023-10-31 (×2): qty 2, 28d supply, fill #1
  Filled 2023-12-07: qty 2, 28d supply, fill #2

## 2023-10-03 NOTE — Addendum Note (Signed)
Addended by: Cheree Ditto on: 10/03/2023 04:56 PM   Modules accepted: Orders

## 2023-10-03 NOTE — Telephone Encounter (Signed)
Pharmacy Patient Advocate Encounter   Received notification from Patient Advice Request messages that prior authorization for praluent is required/requested.   Insurance verification completed.   The patient is insured through [facility] .   Per test claim: The current 10/03/23 day co-pay is, $100.00 one month.  No PA needed at this time. This test claim was processed through [facility]- copay amounts may vary at other pharmacies due to pharmacy/plan contracts, or as the patient moves through the different stages of their insurance plan.

## 2023-10-04 ENCOUNTER — Other Ambulatory Visit (HOSPITAL_COMMUNITY): Payer: Self-pay

## 2023-10-04 ENCOUNTER — Encounter: Payer: Self-pay | Admitting: Radiation Oncology

## 2023-10-11 ENCOUNTER — Ambulatory Visit (INDEPENDENT_AMBULATORY_CARE_PROVIDER_SITE_OTHER)
Admission: RE | Admit: 2023-10-11 | Discharge: 2023-10-11 | Discharge status: Home / Self Care | Payer: 59 | Source: Ambulatory Visit | Attending: Physician Assistant

## 2023-10-11 DIAGNOSIS — Z780 Asymptomatic menopausal state: Secondary | ICD-10-CM

## 2023-10-12 ENCOUNTER — Encounter: Payer: Self-pay | Admitting: Physician Assistant

## 2023-10-13 ENCOUNTER — Telehealth: Payer: Self-pay | Admitting: *Deleted

## 2023-10-13 ENCOUNTER — Telehealth: Payer: Self-pay | Admitting: Radiation Oncology

## 2023-10-13 NOTE — Telephone Encounter (Addendum)
Returned patient call requesting to r/s follow up visit w. Dr. Basilio Cairo. No answer, LVM for a return call.

## 2023-10-13 NOTE — Telephone Encounter (Signed)
Returned patient's phone call, lvm for a return call 

## 2023-10-20 ENCOUNTER — Other Ambulatory Visit (HOSPITAL_COMMUNITY): Payer: Self-pay

## 2023-10-31 ENCOUNTER — Other Ambulatory Visit (HOSPITAL_COMMUNITY): Payer: Self-pay

## 2023-11-01 ENCOUNTER — Other Ambulatory Visit: Payer: Self-pay

## 2023-11-15 ENCOUNTER — Encounter: Payer: Self-pay | Admitting: Physician Assistant

## 2023-12-05 DIAGNOSIS — C099 Malignant neoplasm of tonsil, unspecified: Secondary | ICD-10-CM | POA: Diagnosis not present

## 2023-12-06 ENCOUNTER — Encounter: Payer: Self-pay | Admitting: Internal Medicine

## 2023-12-06 DIAGNOSIS — R931 Abnormal findings on diagnostic imaging of heart and coronary circulation: Secondary | ICD-10-CM

## 2023-12-06 DIAGNOSIS — E785 Hyperlipidemia, unspecified: Secondary | ICD-10-CM

## 2023-12-08 ENCOUNTER — Other Ambulatory Visit (HOSPITAL_COMMUNITY): Payer: Self-pay

## 2023-12-13 ENCOUNTER — Ambulatory Visit: Admitting: Cardiovascular Disease

## 2023-12-22 NOTE — Progress Notes (Signed)
 Ms. Jennifer [name] presents today in the clinic for a 6 month follow up. She completed radiation treatment for Malignant neoplasm of overlapping sites of the tonsil on 05/10/2022  Patient is doing very well after radiation treatment. Taste buds are returning back to normal.   Pain issues, if any: Patient denies Using a feeding tube?: Patient denies Weight changes, if any: Patient gained 5 pounds but weight is stable. Swallowing issues, if any: Patient has trouble swallowing foods that are dry, has to have a lot sauce with foods. Smoking or chewing tobacco? Patient denies Using fluoride toothpaste daily? Yes Last ENT visit was on: March 2025 Other notable issues, if any:  Patient feels like muscles in neck are weak and feels like head is heavy, the feeling is intermittent.   BP (!) 112/51 (BP Location: Left Arm, Patient Position: Sitting)   Pulse 85   Temp (!) 97.1 F (36.2 C) (Temporal)   Resp 18   Ht 5\' 4"  (1.626 m)   Wt 121 lb 8 oz (55.1 kg)   SpO2 100%   BMI 20.86 kg/m   Wt Readings from Last 3 Encounters:  03/24/23 118 lb 9 oz (53.8 kg)  02/17/23 118 lb 8 oz (53.8 kg)  11/04/22 119 lb (54 kg)

## 2023-12-26 ENCOUNTER — Ambulatory Visit
Admission: RE | Admit: 2023-12-26 | Discharge: 2023-12-26 | Disposition: A | Discharge status: Home / Self Care | Payer: 59 | Source: Ambulatory Visit | Attending: Radiation Oncology | Admitting: Radiation Oncology

## 2023-12-26 VITALS — BP 112/51 | HR 85 | Temp 97.1°F | Resp 18 | Ht 64.0 in | Wt 121.5 lb

## 2023-12-26 DIAGNOSIS — C098 Malignant neoplasm of overlapping sites of tonsil: Secondary | ICD-10-CM | POA: Diagnosis not present

## 2023-12-26 DIAGNOSIS — R1310 Dysphagia, unspecified: Secondary | ICD-10-CM | POA: Diagnosis not present

## 2023-12-26 DIAGNOSIS — Z7982 Long term (current) use of aspirin: Secondary | ICD-10-CM | POA: Insufficient documentation

## 2023-12-26 DIAGNOSIS — Z85818 Personal history of malignant neoplasm of other sites of lip, oral cavity, and pharynx: Secondary | ICD-10-CM | POA: Diagnosis not present

## 2023-12-26 DIAGNOSIS — C090 Malignant neoplasm of tonsillar fossa: Secondary | ICD-10-CM

## 2023-12-26 DIAGNOSIS — C770 Secondary and unspecified malignant neoplasm of lymph nodes of head, face and neck: Secondary | ICD-10-CM | POA: Diagnosis not present

## 2023-12-26 DIAGNOSIS — Z923 Personal history of irradiation: Secondary | ICD-10-CM | POA: Insufficient documentation

## 2023-12-26 NOTE — Progress Notes (Signed)
 Radiation Oncology         [phone]) [phone] ________________________________  Name: Jennifer [name] MRN: [id number]  Date: 12/26/2023  DOB: [birth year]  Follow-Up Visit Note  CC: [place], [place], [place]  [place], [place], [place]  Diagnosis and Prior Radiotherapy:       ICD-10-CM   1. 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) Staging form: Pharynx - HPV-Mediated Oropharynx, AJCC 8th Edition - Clinical stage from 01/03/2022: Stage I (cT0, cN1, cM0, p16+) - Signed by Colie Dawes, MD on 01/04/2022 Stage prefix: Initial diagnosis  Stage 1 (pT1, pN1, M0) Squamous cell carcinoma of the tonsil, p16+; s/p radical tonsillectomy, left neck dissection, and adjuvant radiation completed on 05/10/2022  Intent: Curative Radiation Treatment Dates: 03/30/2022 through 05/10/2022 Site Technique Total Dose (Gy) Dose per Fx (Gy) Completed Fx Beam Energies  Neck: HN_Ltonsil IMRT 60/60 2 30/30 6X    CHIEF COMPLAINT:  Here for follow-up and surveillance of tonsil cancer  Patient is doing very well after radiation treatment. Taste buds are returning back to normal.   Pain issues, if any: Patient denies Using a feeding tube?: Patient denies Weight changes, if any: Patient gained 5 pounds but weight is stable. Swallowing issues, if any: Patient has trouble swallowing foods that are dry, has to have a lot sauce with foods. Smoking or chewing tobacco? Patient denies Using fluoride toothpaste daily? Yes Last ENT visit was on: March 2025 Other notable issues, if any:  Patient feels like muscles in neck are weak and feels like head is heavy, the feeling is  intermittent.  Patient last saw Dr. Caldwell Castles on 12/05/2023 "Data reviewed:  09/27/23 NavDx - negative  Plan:  There is no evidence of disease on exam today.  She sees Dr Lurena Sally this spring, Julia in June I'll see her back in August . Next NavDx at that time "  ALLERGIES:  is allergic to atorvastatin, codeine, hydrocodone, oxycodone, and zetia [ezetimibe].  Meds: Current Outpatient Medications  Medication Sig Dispense Refill   Alirocumab (PRALUENT) 75 MG/ML SOAJ Inject 1 mL (75 mg total) into the skin every 14 (fourteen) days. 2 mL 2   aspirin EC 81 MG tablet Take 81 mg by mouth daily.     calcium elemental as carbonate (BARIATRIC TUMS ULTRA) 400 MG chewable tablet Chew 1,000 mg by mouth daily.     cholecalciferol (VITAMIN D3) 25 MCG (1000 UNIT) tablet Take 1,000 Units by mouth 3 (three) times a week.     erythromycin ophthalmic ointment Apply to right eyelids at bedtime as directed for 1 month. 3.5 g 1   estradiol (ESTRACE) 0.1 MG/GM vaginal cream Apply intravaginally once daily for 2 weeks, then decrease to 1 to 3 times weekly 42.5 g 0   ferrous sulfate 325 (65 FE) MG tablet Take 325 mg by mouth 3 (three) times a week.      ibuprofen (ADVIL,MOTRIN) 200 MG tablet Take 400 mg by mouth every 6 (six) hours as needed for mild pain.     Misc Natural Products (PROGESTERONE EX) Apply 5 drops topically at bedtime. Bioidentical progesterone compounded solution     zolpidem (AMBIEN) 5 MG tablet TAKE 1 TABLET  BY MOUTH AT BEDTIME AS NEEDED FOR SLEEP (Patient not taking: Reported on 03/24/2023) 10 tablet 0   No current facility-administered medications for this encounter.    Physical Findings:  The patient is in no acute distress. Patient is alert and oriented. Wt Readings from Last 3 Encounters:  12/26/23 121 lb 8 oz (55.1 kg)  03/24/23 118 lb 9 oz (53.8 kg)  02/17/23 118 lb 8 oz (53.8 kg)    height is 5\' 4"  (1.626 m) and weight is 121 lb 8 oz (55.1 kg). Her temporal temperature is 97.1 F  (36.2 C) (abnormal). Her blood pressure is 112/51 (abnormal) and her pulse is 85. Her respiration is 18 and oxygen saturation is 100%. .  General: Alert and oriented, in no acute distress HEENT: Head is normocephalic. Extraocular movements are intact. Oropharynx moist, is notable for scarring at the tonsil bed. No sign of tumor or infection.   Neck: Neck is notable for no masses Skin: Skin in treatment fields shows satisfactory healing on neck CV: Heart RRR, lungs CTA in all lung fields Extremities: No cyanosis or edema. Lymphatics: see Neck Exam Psychiatric: Judgment and insight are intact. Affect is appropriate.   Lab Findings: Lab Results  Component Value Date   WBC 3.2 (L) 02/21/2023   HGB 14.5 02/21/2023   HCT 43.9 02/21/2023   MCV 91.2 02/21/2023   PLT 258.0 02/21/2023    Lab Results  Component Value Date   TSH 5.25 02/21/2023    Radiographic Findings: No results found.  Impression/Plan:  Stage 1 (pT1, pN1, M0) Squamous cell carcinoma of the tonsil, p16+; s/p radical tonsillectomy, left neck dissection, and adjuvant radiation completed on 05/10/2022  1) Head and Neck Cancer Status: Patient is doing well overall today. She is pleased with her progress to date.   2) Nutritional Status:  Stable, tolerating a wide variety of food.  Wt Readings from Last 3 Encounters:  12/26/23 121 lb 8 oz (55.1 kg)  03/24/23 118 lb 9 oz (53.8 kg)  02/17/23 118 lb 8 oz (53.8 kg)  PEG tube: none   3) Swallowing: Good function - continues to use plenty of sauce to swallow dry foods but feels like her salivary glands have regained some function within the past year  6) Dental: Encouraged to continue regular followup with dentistry, and dental hygiene including fluoride rinses. Patient is seeing dentist regularly.   7) Thyroid function: Getting drawn through her PCP Lab Results  Component Value Date   TSH 5.25 02/21/2023    8) Other: Patient experiencing a one month history of  intermittent neck weakness. She states she has not been doing her PT exercises. We will refer her back to PT to help with this.   8) Follow-up: patient will be see Dr. Caldwell Castles in August with repeat NavDx at that time. We will see her back in one year.     On date of service, in total, I spent 30 minutes on this encounter. Patient was seen in person. _____________________________________   Julio Ohm, PA-C   Colie Dawes, MD    [facility]  Radiation Oncology Direct Dial: [phone]  Fax: [phone] [email or web address]

## 2023-12-27 ENCOUNTER — Telehealth: Payer: Self-pay | Admitting: Radiation Oncology

## 2023-12-27 NOTE — Telephone Encounter (Signed)
 4/16 @ 10:11 am Left voicemail for patient to call our office to be schedule for her yearly follow up.  Waiting on call back to confirm date/time.

## 2024-01-02 ENCOUNTER — Other Ambulatory Visit (HOSPITAL_COMMUNITY): Payer: Self-pay

## 2024-01-02 ENCOUNTER — Other Ambulatory Visit: Payer: Self-pay | Admitting: Radiology

## 2024-01-02 DIAGNOSIS — C090 Malignant neoplasm of tonsillar fossa: Secondary | ICD-10-CM

## 2024-01-02 MED ORDER — PRALUENT 75 MG/ML ~~LOC~~ SOAJ
75.0000 mg | SUBCUTANEOUS | 5 refills | Status: DC
Start: 2024-01-02 — End: 2024-01-24
  Filled 2024-01-02: qty 2, 28d supply, fill #0

## 2024-01-02 NOTE — Addendum Note (Signed)
 Addended by: Rosalva Neary D on: 01/02/2024 01:07 PM   Modules accepted: Orders

## 2024-01-05 ENCOUNTER — Ambulatory Visit: Payer: Self-pay | Admitting: Radiation Oncology

## 2024-01-23 ENCOUNTER — Other Ambulatory Visit (HOSPITAL_COMMUNITY): Payer: Self-pay

## 2024-01-23 ENCOUNTER — Telehealth: Payer: Self-pay | Admitting: Pharmacy Technician

## 2024-01-23 ENCOUNTER — Encounter: Payer: Self-pay | Admitting: Radiation Oncology

## 2024-01-23 NOTE — Telephone Encounter (Signed)
 Pharmacy Patient Advocate Encounter   Received notification from Physician's Office that prior authorization for Repatha  is required/requested.   Insurance verification completed.   The patient is insured through [facility] .   Per test claim: PA required; PA submitted to above mentioned insurance via CoverMyMeds Key/confirmation #/EOC [id number] Status is pending

## 2024-01-24 ENCOUNTER — Other Ambulatory Visit (HOSPITAL_COMMUNITY): Payer: Self-pay

## 2024-01-24 ENCOUNTER — Other Ambulatory Visit: Payer: Self-pay

## 2024-01-24 MED ORDER — REPATHA SURECLICK 140 MG/ML ~~LOC~~ SOAJ
1.0000 mL | SUBCUTANEOUS | 11 refills | Status: DC
  Filled 2024-01-24: qty 2, 28d supply, fill #0

## 2024-01-24 NOTE — Telephone Encounter (Signed)
 Pharmacy Patient Advocate Encounter  Received notification from [employer] that Prior Authorization for repatha  has been APPROVED from 01/23/24 to 01/21/25. Ran test claim, Copay is $24.99- one month. This test claim was processed through [facility]- copay amounts may vary at other pharmacies due to pharmacy/plan contracts, or as the patient moves through the different stages of their insurance plan.   PA #/Case ID/Reference #: [id number]

## 2024-01-24 NOTE — Addendum Note (Signed)
 Addended by: Haji Delaine D on: 01/24/2024 12:19 PM   Modules accepted: Orders

## 2024-01-25 ENCOUNTER — Other Ambulatory Visit (HOSPITAL_COMMUNITY): Payer: Self-pay

## 2024-01-25 MED ORDER — REPATHA SURECLICK 140 MG/ML ~~LOC~~ SOAJ
1.0000 mL | SUBCUTANEOUS | 11 refills | Status: DC
  Filled 2024-01-25: qty 2, 28d supply, fill #0
  Filled 2024-02-17: qty 2, 28d supply, fill #1

## 2024-01-25 NOTE — Addendum Note (Signed)
 Addended by: Sunny English on: 01/25/2024 02:46 PM   Modules accepted: Orders

## 2024-01-26 ENCOUNTER — Other Ambulatory Visit (HOSPITAL_COMMUNITY): Payer: Self-pay

## 2024-02-02 ENCOUNTER — Ambulatory Visit: Admitting: Physical Therapy

## 2024-02-19 ENCOUNTER — Other Ambulatory Visit: Payer: Self-pay

## 2024-02-21 ENCOUNTER — Other Ambulatory Visit: Payer: Self-pay | Admitting: Physician Assistant

## 2024-02-21 DIAGNOSIS — Z1231 Encounter for screening mammogram for malignant neoplasm of breast: Secondary | ICD-10-CM

## 2024-02-22 DIAGNOSIS — R1312 Dysphagia, oropharyngeal phase: Secondary | ICD-10-CM | POA: Diagnosis not present

## 2024-02-23 ENCOUNTER — Ambulatory Visit: Admitting: Cardiology

## 2024-03-01 ENCOUNTER — Encounter: Admitting: Physician Assistant

## 2024-03-03 ENCOUNTER — Encounter: Payer: Self-pay | Admitting: Physician Assistant

## 2024-03-04 ENCOUNTER — Telehealth (INDEPENDENT_AMBULATORY_CARE_PROVIDER_SITE_OTHER): Admitting: Physician Assistant

## 2024-03-04 ENCOUNTER — Telehealth: Payer: Self-pay | Admitting: Pharmacist

## 2024-03-04 ENCOUNTER — Encounter: Payer: Self-pay | Admitting: Physician Assistant

## 2024-03-04 VITALS — Ht 64.0 in | Wt 118.0 lb

## 2024-03-04 DIAGNOSIS — M791 Myalgia, unspecified site: Secondary | ICD-10-CM

## 2024-03-04 DIAGNOSIS — U071 COVID-19: Secondary | ICD-10-CM

## 2024-03-04 DIAGNOSIS — R931 Abnormal findings on diagnostic imaging of heart and coronary circulation: Secondary | ICD-10-CM

## 2024-03-04 DIAGNOSIS — E785 Hyperlipidemia, unspecified: Secondary | ICD-10-CM

## 2024-03-04 NOTE — Progress Notes (Signed)
 Virtual Visit via Video   I connected with Jennifer [name] on 03/04/24 at 11:20 AM EDT by a video enabled telemedicine application and verified that I am speaking with the correct person using two identifiers. Location patient: Home Location provider: [facility], Office Persons participating in the virtual visit: Jennifer [name], [place] PA-C, Jennifer Chute, LPN   I discussed the limitations of evaluation and management by telemedicine and the availability of in person appointments. The patient expressed understanding and agreed to proceed.  I acted as a Neurosurgeon for [employer], PA-C [employer], LPN   Subjective:   HPI:   COVID-19 Positive Symptom onset: Saturday Travel/contacts: Pt flew to [place] on Thursday  Vaccination status: 4 shots Testing results: Home COVID test positive on Sunday  Patient endorses the following symptoms: Headache, congestion, runny nose, cough and chest hurts with coughing, back pain. Pt flew back last night had episode of chest pain on the plane, very brief. When she coughs she has pain. Lower to mid back pain. Cramp  Patient denies the following symptoms: Denies SOB.  Treatments tried: Ibuprofen,   ROS: See pertinent positives and negatives per HPI.  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   Nonallopathic lesion of sacral region 07/02/2020   Microcytic anemia 04/21/2020   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 04/21/2020   Pain of left  hand 02/07/2019   Small bowel obstruction (HCC) 05/21/2018   Popliteus tendinitis of right lower extremity 08/01/2017   GERD (gastroesophageal reflux disease) 03/04/2017   Endometriosis 09/12/1985    Social History   Tobacco Use   Smoking status: Former    Current packs/day: 0.00    Average packs/day: 0.3 packs/day for 3.0 years (0.8 ttl pk-yrs)    Types: Cigarettes    Start date: 09/12/1984    Quit date: 09/13/1987    Years since quitting: 36.4   Smokeless tobacco: Never   Tobacco comments:    smoked 30 years ago in college  Substance Use Topics   Alcohol use: Yes    Comment: Occasionally    Current Outpatient Medications:    aspirin EC 81 MG tablet, Take 81 mg by mouth daily., Disp: , Rfl:    calcium  elemental as carbonate (BARIATRIC TUMS ULTRA) 400 MG chewable tablet, Chew 1,000 mg by mouth daily., Disp: , Rfl:    cholecalciferol (VITAMIN D3) 25 MCG (1000 UNIT) tablet, Take 1,000 Units by mouth 3 (three) times a week., Disp: , Rfl:    estradiol  (ESTRACE ) 0.1 MG/GM vaginal cream, Apply intravaginally once daily for 2 weeks, then decrease to 1 to 3 times weekly, Disp: 42.5 g, Rfl: 0   Evolocumab  (REPATHA  SURECLICK) 140 MG/ML SOAJ, Inject 140 mg into the skin every 14 (fourteen) days., Disp: 2 mL, Rfl: 11   ferrous sulfate 325 (65 FE) MG tablet, Take 325 mg by mouth 3 (three) times a week. , Disp: , Rfl:    ibuprofen (ADVIL,MOTRIN) 200 MG tablet, Take 400 mg by mouth every 6 (six) hours as needed for mild  pain., Disp: , Rfl:    Misc Natural Products (PROGESTERONE  EX), Apply 5 drops topically at bedtime. Bioidentical progesterone  compounded solution, Disp: , Rfl:    zolpidem  (AMBIEN ) 5 MG tablet, TAKE 1 TABLET BY MOUTH AT BEDTIME AS NEEDED FOR SLEEP, Disp: 10 tablet, Rfl: 0  Allergies  Allergen Reactions   Atorvastatin  Other (See Comments)    myalgias   Codeine Nausea And Vomiting   Hydrocodone  Other (See Comments)   Oxycodone  Nausea And Vomiting   Zetia  [Ezetimibe ] Other (See  Comments)    myalgias    Objective:   VITALS: Per patient if applicable, see vitals. GENERAL: Alert, appears well and in no acute distress. HEENT: Atraumatic, conjunctiva clear, no obvious abnormalities on inspection of external nose and ears. NECK: Normal movements of the head and neck. CARDIOPULMONARY: No increased WOB. Speaking in clear sentences. I:E ratio WNL.  MS: Moves all visible extremities without noticeable abnormality. PSYCH: Pleasant and cooperative, well-groomed. Speech normal rate and rhythm. Affect is appropriate. Insight and judgement are appropriate. Attention is focused, linear, and appropriate.  NEURO: CN grossly intact. Oriented as arrived to appointment on time with no prompting. Moves both UE equally.  SKIN: No obvious lesions, wounds, erythema, or cyanosis noted on face or hands.  Assessment and Plan:   Jennifer [name] was seen today for covid positive.  Diagnoses and all orders for this visit:  COVID-19   No red flags on discussion, patient is not in any obvious distress during our visit. Discussed progression of most viral illnesses, and recommended supportive care at this point in time.  Discussed use of Paxlovid however we did discuss current limitations of this medication and general lack of efficacy at present -- she is not interested in this medication at present. Discussed over the counter supportive care options, including Tylenol  500 mg q 8 hours, with recommendations to push fluids and rest. Reviewed return precautions including new/worsening fever, SOB, new/worsening cough, sudden onset changes of symptoms. Recommended need to self-quarantine and practice social distancing until symptoms resolve. I recommend that patient follow-up if symptoms worsen or persist despite treatment x 7-10 days, sooner if needed.  I discussed the assessment and treatment plan with the patient. The patient was provided an opportunity to ask questions and all were answered.  The patient agreed with the plan and demonstrated an understanding of the instructions.   The patient was advised to call back or seek an in-person evaluation if the symptoms worsen or if the condition fails to improve as anticipated.   [place], [place] 03/04/2024

## 2024-03-04 NOTE — Telephone Encounter (Signed)
 Pt c/o medication issue:  1. Name of Medication: Evolocumab  (REPATHA  SURECLICK) 140 MG/ML SOAJ   2. How are you currently taking this medication (dosage and times per day)? As written  3. Are you having a reaction (difficulty breathing--STAT)? no  4. What is your medication issue? It is causing her leg pain and wants to go back on the Praluent 

## 2024-03-06 ENCOUNTER — Encounter: Payer: Self-pay | Admitting: Radiation Oncology

## 2024-03-06 ENCOUNTER — Other Ambulatory Visit (HOSPITAL_COMMUNITY): Payer: Self-pay

## 2024-03-06 ENCOUNTER — Telehealth: Payer: Self-pay | Admitting: Pharmacy Technician

## 2024-03-06 MED ORDER — PRALUENT 75 MG/ML ~~LOC~~ SOAJ
75.0000 mg | SUBCUTANEOUS | 11 refills | Status: AC
  Filled 2024-03-06: qty 2, 28d supply, fill #0
  Filled 2024-04-03: qty 2, 28d supply, fill #1
  Filled 2024-05-03: qty 2, 28d supply, fill #2
  Filled 2024-05-29: qty 2, 28d supply, fill #3
  Filled 2024-07-15: qty 2, 28d supply, fill #4
  Filled 2024-08-17 (×2): qty 2, 28d supply, fill #5
  Filled 2024-08-31 – 2024-09-09 (×2): qty 2, 28d supply, fill #6
  Filled 2024-10-07: qty 2, 28d supply, fill #7

## 2024-03-06 NOTE — Telephone Encounter (Signed)
 Please complete PA for Praluent 

## 2024-03-06 NOTE — Telephone Encounter (Signed)
 Jennifer [name]

## 2024-03-13 ENCOUNTER — Telehealth: Admitting: Physician Assistant

## 2024-03-13 DIAGNOSIS — L500 Allergic urticaria: Secondary | ICD-10-CM

## 2024-03-13 MED ORDER — PREDNISONE 10 MG (21) PO TBPK
ORAL_TABLET | ORAL | 0 refills | Status: DC
Start: 2024-03-13 — End: 2024-05-07

## 2024-03-13 MED ORDER — LEVOCETIRIZINE DIHYDROCHLORIDE 5 MG PO TABS
5.0000 mg | ORAL_TABLET | Freq: Every evening | ORAL | 0 refills | Status: DC
Start: 2024-03-13 — End: 2024-05-07

## 2024-03-13 NOTE — Progress Notes (Signed)
 Virtual Visit Consent   Jennifer [name], you are scheduled for a virtual visit with a [facility] provider today. Just as with appointments in the office, your consent must be obtained to participate. Your consent will be active for this visit and any virtual visit you may have with one of our providers in the next 365 days. If you have a MyChart account, a copy of this consent can be sent to you electronically.  As this is a virtual visit, video technology does not allow for your provider to perform a traditional examination. This may limit your provider's ability to fully assess your condition. If your provider identifies any concerns that need to be evaluated in person or the need to arrange testing (such as labs, EKG, etc.), we will make arrangements to do so. Although advances in technology are sophisticated, we cannot ensure that it will always work on either your end or our end. If the connection with a video visit is poor, the visit may have to be switched to a telephone visit. With either a video or telephone visit, we are not always able to ensure that we have a secure connection.  By engaging in this virtual visit, you consent to the provision of healthcare and authorize for your insurance to be billed (if applicable) for the services provided during this visit. Depending on your insurance coverage, you may receive a charge related to this service.  I need to obtain your verbal consent now. Are you willing to proceed with your visit today? Jennifer [name] has provided verbal consent on 03/13/2024 for a virtual visit (video or telephone). Jennifer [name], [place]  Date: 03/13/2024 9:01 AM   Virtual Visit via Video Note   I, Jennifer [name], connected with  [name]  ([id number], 1960/09/22) on 03/13/24 at  8:30 AM EDT by a video-enabled telemedicine application and verified that I am speaking with the correct person using two identifiers.  Location: Patient: Virtual Visit Location  Patient: Home Provider: Virtual Visit Location Provider: Home Office   I discussed the limitations of evaluation and management by telemedicine and the availability of in person appointments. The patient expressed understanding and agreed to proceed.    History of Present Illness: Jennifer [name] is a 63 y.o. who identifies as a female who was assigned female at birth, and is being seen today for hives first noted yesterday morning. Notes she was diagnosed with COVID 8-9 days ago and finally recovering from that. Sunday PM was outside and noted some mosquito bites. Monday AM noted bites were slightly pruritic. Then yesterday AM notes she woke up with substantial itching and burning of her hands bilaterally. Got in the shower and noted hives all down her side and the buttock.  Continued throughout the day -- getting more pronounced and itchy This AM -- woke up and washed her face and noted hives on her face. She has put on makeup for work to cover the blotches. Denies facial swelling or shortness of breath. Denies racing heart. Noted some positional lightheadedness with standing quickly. Denies any overt vertigo. Denies any new soaps, lotions or detergents. Denies recent change in medication except for use of more OTC Ibuprofen than normal to help with her COVID headache.   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   Nonallopathic lesion of sacral region 07/02/2020   Microcytic  anemia 04/21/2020   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 04/21/2020   Pain of left hand 02/07/2019   Small bowel obstruction (HCC) 05/21/2018   Popliteus tendinitis of right lower extremity 08/01/2017   GERD (gastroesophageal reflux disease) 03/04/2017   Endometriosis 09/12/1985    Allergies:  Allergies  Allergen Reactions   Atorvastatin  Other (See Comments)    myalgias   Codeine Nausea And Vomiting   Hydrocodone  Other (See Comments)   Oxycodone  Nausea And Vomiting   Zetia  [Ezetimibe ] Other (See Comments)    myalgias   Medications:  Current Outpatient Medications:    levocetirizine (XYZAL) 5 MG tablet, Take 1 tablet (5 mg total) by mouth every evening., Disp: 30 tablet, Rfl: 0   predniSONE  (STERAPRED UNI-PAK 21 TAB) 10 MG (21) TBPK tablet, Take following package directions, Disp: 21 tablet, Rfl: 0   Alirocumab  (PRALUENT ) 75 MG/ML SOAJ, Inject 1 mL (75 mg total) into the skin every 14 (fourteen) days., Disp: 2 mL, Rfl: 11   aspirin EC 81 MG tablet, Take 81 mg by mouth daily., Disp: , Rfl:    calcium  elemental as carbonate (BARIATRIC TUMS ULTRA) 400 MG chewable tablet, Chew 1,000 mg by mouth daily., Disp: , Rfl:    cholecalciferol (VITAMIN D3) 25 MCG (1000 UNIT) tablet, Take 1,000 Units by mouth 3 (three) times a week., Disp: , Rfl:    estradiol  (ESTRACE ) 0.1 MG/GM vaginal cream, Apply intravaginally once daily for 2 weeks, then decrease to 1 to 3 times weekly, Disp: 42.5 g, Rfl: 0   ferrous sulfate 325 (65 FE) MG tablet, Take 325 mg by mouth 3 (three) times a week. , Disp: , Rfl:    ibuprofen (ADVIL,MOTRIN) 200 MG tablet, Take 400 mg by mouth every 6 (six) hours as needed for mild pain., Disp: , Rfl:    Misc Natural Products (PROGESTERONE  EX), Apply 5 drops topically at bedtime. Bioidentical progesterone  compounded solution, Disp: , Rfl:    zolpidem  (AMBIEN ) 5 MG tablet, TAKE 1 TABLET BY MOUTH AT BEDTIME AS NEEDED FOR SLEEP, Disp: 10 tablet, Rfl: 0  Observations/Objective: Patient is  well-developed, well-nourished in no acute distress.  Resting comfortably  at home.  Head is normocephalic, atraumatic.  No labored breathing.  Speech is clear and coherent with logical content.  Patient is alert and oriented at baseline.  No facial edema noted. Cannot appreciate facial hives due to makeup coverage.  Urticarial rash noted of both of her sides and belt line. Scattered hives of arms noted.  Assessment and Plan: 1. Allergic urticaria (Primary) - predniSONE  (STERAPRED UNI-PAK 21 TAB) 10 MG (21) TBPK tablet; Take following package directions  Dispense: 21 tablet; Refill: 0 - levocetirizine (XYZAL) 5 MG tablet; Take 1 tablet (5 mg total) by mouth every evening.  Dispense: 30 tablet; Refill: 0  Unknown trigger -- possibly increased NSAID use. No SOB or facial swelling. Will start Prednisone  taper, Xyzal 5 mg and OTC Famotidine  20 mg BID. She is to hydrate and rest. Monitor symptoms for improvement. She is aware that if not starting to improve today or substantial improvement over next 24-48 hours, or any worsening symptoms, she  must be evaluated in person ASAP. PCP follow-up for allergy referral giving unknown trigger.  Follow Up Instructions: I discussed the assessment and treatment plan with the patient. The patient was provided an opportunity to ask questions and all were answered. The patient agreed with the plan and demonstrated an understanding of the instructions.  A copy of instructions were sent to the patient via MyChart unless otherwise noted below.   The patient was advised to call back or seek an in-person evaluation if the symptoms worsen or if the condition fails to improve as anticipated.    Jennifer Velma Lunger, PA-C

## 2024-03-13 NOTE — Patient Instructions (Addendum)
 Clarita Jennifer [name], thank you for joining [name], PA-C for today's virtual visit.  While this provider is not your primary care provider (PCP), if your PCP is located in our provider database this encounter information will be shared with them immediately following your visit.   A [facility] MyChart account gives you access to today's visit and all your visits, tests, and labs performed at [facility]  click here if you don't have a [facility] MyChart account or go to [email or web address]  Consent: (Patient) CHRISTEL BAI provided verbal consent for this virtual visit at the beginning of the encounter.  Current Medications:  Current Outpatient Medications:    levocetirizine (XYZAL) 5 MG tablet, Take 1 tablet (5 mg total) by mouth every evening., Disp: 30 tablet, Rfl: 0   predniSONE  (STERAPRED UNI-PAK 21 TAB) 10 MG (21) TBPK tablet, Take following package directions, Disp: 21 tablet, Rfl: 0   Alirocumab  (PRALUENT ) 75 MG/ML SOAJ, Inject 1 mL (75 mg total) into the skin every 14 (fourteen) days., Disp: 2 mL, Rfl: 11   aspirin EC 81 MG tablet, Take 81 mg by mouth daily., Disp: , Rfl:    calcium  elemental as carbonate (BARIATRIC TUMS ULTRA) 400 MG chewable tablet, Chew 1,000 mg by mouth daily., Disp: , Rfl:    cholecalciferol (VITAMIN D3) 25 MCG (1000 UNIT) tablet, Take 1,000 Units by mouth 3 (three) times a week., Disp: , Rfl:    estradiol  (ESTRACE ) 0.1 MG/GM vaginal cream, Apply intravaginally once daily for 2 weeks, then decrease to 1 to 3 times weekly, Disp: 42.5 g, Rfl: 0   ferrous sulfate 325 (65 FE) MG tablet, Take 325 mg by mouth 3 (three) times a week. , Disp: , Rfl:    ibuprofen (ADVIL,MOTRIN) 200 MG tablet, Take 400 mg by mouth every 6 (six) hours as needed for mild pain., Disp: , Rfl:    Misc Natural Products (PROGESTERONE  EX), Apply 5 drops topically at bedtime. Bioidentical progesterone  compounded solution, Disp: , Rfl:    zolpidem  (AMBIEN ) 5 MG tablet, TAKE  1 TABLET BY MOUTH AT BEDTIME AS NEEDED FOR SLEEP, Disp: 10 tablet, Rfl: 0   Medications ordered in this encounter:  Meds ordered this encounter  Medications   predniSONE  (STERAPRED UNI-PAK 21 TAB) 10 MG (21) TBPK tablet    Sig: Take following package directions    Dispense:  21 tablet    Refill:  0    Supervising Provider:   LAMPTEY, PHILIP O [[id number]]   levocetirizine (XYZAL) 5 MG tablet    Sig: Take 1 tablet (5 mg total) by mouth every evening.    Dispense:  30 tablet    Refill:  0    Supervising Provider:   LAMPTEY, PHILIP O [[id number]]     *If you need refills on other medications prior to your next appointment, please contact your pharmacy*  Follow-Up: Call back or seek an in-person evaluation if the symptoms worsen or if the condition fails to improve as anticipated.  [facility] [phone]  Other Instructions Take the prednisone  and Xyzal as directed. I also recommend starting OTC Famotidine  20 mg twice daily.   Hydrate and rest. Monitor symptoms for improvement.  If not starting to improve today or substantial improvement over next 24-48 hours, or any worsening symptoms, you must be evaluated in person ASAP.  I do recommend PCP follow-up for allergy referral giving unknown trigger.   If you have been instructed to have an in-person evaluation today at  a local Urgent Care facility, please use the link below. It will take you to a list of all of our available [facility], including address, phone number and hours of operation. Please do not delay care.  [facility]  If you or a family member do not have a primary care provider, use the link below to schedule a visit and establish care. When you choose a [facility] primary care physician or advanced practice provider, you gain a long-term partner in health. Find a Primary Care Provider  Learn more about [facility] in-office and virtual care options: Start - Get Care  Now

## 2024-03-21 ENCOUNTER — Ambulatory Visit
Admission: RE | Admit: 2024-03-21 | Discharge: 2024-03-21 | Disposition: A | Discharge status: Home / Self Care | Source: Ambulatory Visit

## 2024-03-21 DIAGNOSIS — Z1231 Encounter for screening mammogram for malignant neoplasm of breast: Secondary | ICD-10-CM

## 2024-03-29 ENCOUNTER — Encounter: Payer: 59 | Admitting: Adult Health

## 2024-04-08 ENCOUNTER — Other Ambulatory Visit (HOSPITAL_COMMUNITY): Payer: Self-pay

## 2024-04-12 DIAGNOSIS — T1511XA Foreign body in conjunctival sac, right eye, initial encounter: Secondary | ICD-10-CM | POA: Diagnosis not present

## 2024-04-12 DIAGNOSIS — H01001 Unspecified blepharitis right upper eyelid: Secondary | ICD-10-CM | POA: Diagnosis not present

## 2024-04-12 DIAGNOSIS — H01004 Unspecified blepharitis left upper eyelid: Secondary | ICD-10-CM | POA: Diagnosis not present

## 2024-04-18 ENCOUNTER — Telehealth: Payer: Self-pay | Admitting: Radiation Oncology

## 2024-04-18 NOTE — Telephone Encounter (Signed)
 Received medical record request from [facility] and [facility], forwarded request to [employer] 04/18/24.

## 2024-04-22 ENCOUNTER — Telehealth: Payer: Self-pay | Admitting: Gastroenterology

## 2024-04-22 ENCOUNTER — Encounter (HOSPITAL_BASED_OUTPATIENT_CLINIC_OR_DEPARTMENT_OTHER): Payer: Self-pay | Admitting: Physician Assistant

## 2024-04-22 ENCOUNTER — Ambulatory Visit (HOSPITAL_BASED_OUTPATIENT_CLINIC_OR_DEPARTMENT_OTHER): Admitting: Physician Assistant

## 2024-04-22 ENCOUNTER — Ambulatory Visit (INDEPENDENT_AMBULATORY_CARE_PROVIDER_SITE_OTHER)
Admission: RE | Admit: 2024-04-22 | Discharge: 2024-04-22 | Disposition: A | Discharge status: Home / Self Care | Source: Ambulatory Visit | Attending: Gastroenterology | Admitting: Gastroenterology

## 2024-04-22 DIAGNOSIS — M79672 Pain in left foot: Secondary | ICD-10-CM

## 2024-04-22 DIAGNOSIS — M19072 Primary osteoarthritis, left ankle and foot: Secondary | ICD-10-CM | POA: Diagnosis not present

## 2024-04-22 NOTE — Telephone Encounter (Signed)
 Patient known to me. Had a fall yesterday with pain in left foot and ankle with swelling that has persisted. Offered x ray to ensure no fracture, will order.

## 2024-04-22 NOTE — Progress Notes (Signed)
 Office Visit Note   Patient: Jennifer [name]           Date of Birth: 23-Jul-1961           MRN: [id number] Visit Date: 04/22/2024              Requested by: Job Lukes, [place] [address] [place],  [place] [address] PCP: Job Lukes, [place]   Assessment & Plan: Visit Diagnoses:  1. 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  She had an inversion injury rolling her ankle and foot last night.  She is complaining of pain on the dorsal part of her foot.  Along with some swelling she has no plantar ecchymosis no real tenderness over the Lisfranc complex she does have what appears to be some avulsion fractures and some degenerative changes at the talonavicular joint as is coordinate to where she is painful.  Will place her in a boot reexamine her in a week.  She is going on a hiking trip in 2 weeks in [place].  She does have good hiking boots  Follow-Up Instructions: No follow-ups on file.   Orders:  No orders of the defined types were placed in this encounter.  No orders of the defined types were placed in this encounter.     Procedures: No procedures performed   Clinical Data: No additional findings.   Subjective: Chief Complaint  Patient presents with   Left Foot - Pain    HPI Patient is a pleasant 63 year old woman comes in today with a chief complaint of left dorsal foot pain after rolling her foot last night.  She was able to bear weight but has quite a bit of pain on the dorsal aspect of her foot Review of Systems  All other systems reviewed and are negative.    Objective: Vital Signs: There were no vitals taken for this visit.  Physical Exam Constitutional:      Appearance: Normal appearance.  Pulmonary:     Effort: Pulmonary effort is normal.  Skin:    General: Skin is warm and dry.  Neurological:     Mental Status: She is alert.  Psychiatric:        Mood and Affect: Mood normal.         Behavior: Behavior normal.     Ortho Exam She has an easily palpable dorsalis pedis pulse foot is warm brisk capillary refill she has focal swelling over the talonavicular joint.  She has some limiting pain to dorsiflexion plantarflexion eversion inversion.  She has no tenderness with manipulation of the midfoot Specialty Comments:  No specialty comments available.  Imaging: No results found.   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   Nonallopathic lesion of sacral region 07/02/2020   Microcytic anemia 04/21/2020   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 04/21/2020   Pain of left hand 02/07/2019   Small bowel obstruction (HCC)  05/21/2018   Popliteus tendinitis of right lower extremity 08/01/2017   GERD (gastroesophageal reflux disease) 03/04/2017   Endometriosis 09/12/1985   Past Medical History:  Diagnosis Date   Anemia    Bowel obstruction (HCC)    Cancer (HCC) 12-16-2021   metastatic squamous cell carcimoma   Fibroids    GERD (gastroesophageal reflux disease)    H/O small bowel obstruction    Hyperlipidemia    no meds, diet controlled   Restless legs syndrome (RLS)     Family History  Problem Relation Age of Onset   Ovarian cancer Mother    Early death Mother    Diabetes Father    Hyperlipidemia Father    Heart disease Father    Cancer - Other Maternal Grandmother        Lymphatic   Breast cancer Paternal Grandmother     COPD Paternal Grandfather    Breast cancer Maternal Aunt     Past Surgical History:  Procedure Laterality Date   ABDOMINAL HYSTERECTOMY     DIAGNOSTIC LAPAROSCOPY     x several with lysis of adhesions   EXPLORATORY LAPAROTOMY     had bowel obstruction   EYE SURGERY Bilateral    Lasik   LARYNGOSCOPY AND ESOPHAGOSCOPY N/A 01/07/2022   Procedure: DIRECT LARYNGOSCOPY WITH BIOPSY; ESOPHAGOSCOPY; FROZEN SECTIONS;  Surgeon: Jesus Oliphant, MD;  Location: [facility] OR;  Service: ENT;  Laterality: N/A;   ROTATOR CUFF REPAIR Right 2017   SMALL INTESTINE SURGERY  [id number]   [facility] - Dr Janeane   Social History   Occupational History   Occupation: CMA- Adult nurse GI    Employer: [employer]  Tobacco Use   Smoking status: Former    Current packs/day: 0.00    Average packs/day: 0.3 packs/day for 3.0 years (0.8 ttl pk-yrs)    Types: Cigarettes    Start date: 09/12/1984    Quit date: 09/13/1987    Years since quitting: 36.6   Smokeless tobacco: Never   Tobacco comments:    smoked 30 years ago in college  Vaping Use   Vaping status: Never Used  Substance and Sexual Activity   Alcohol use: Yes    Comment: Occasionally   Drug use: No   Sexual activity: Yes    Partners: Male    Birth control/protection: Surgical, None    Comment: Hysterectomy

## 2024-04-26 ENCOUNTER — Other Ambulatory Visit (HOSPITAL_COMMUNITY): Payer: Self-pay

## 2024-04-27 ENCOUNTER — Other Ambulatory Visit (HOSPITAL_COMMUNITY): Payer: Self-pay

## 2024-04-29 ENCOUNTER — Encounter: Payer: Self-pay | Admitting: Radiation Oncology

## 2024-04-29 ENCOUNTER — Other Ambulatory Visit: Payer: Self-pay

## 2024-04-29 ENCOUNTER — Other Ambulatory Visit (HOSPITAL_COMMUNITY): Payer: Self-pay

## 2024-04-29 MED ORDER — XDEMVY 0.25 % OP SOLN: OPHTHALMIC | 3 refills | Status: DC

## 2024-04-29 NOTE — Progress Notes (Signed)
 Patient prescription for Xdemvy  was faxed to us . This is limited distribution and must be filled by [facility]. Kim P placed override for them to fill and transfer was faxed to them at [phone]. Provided representative at pharmacy, Rock CROME, with patient phone number, address and insurance information as well as informed that override has been placed. Contacted patient back to inform and provided her with their phone number to reach out in case she does not hear from them.

## 2024-05-02 ENCOUNTER — Other Ambulatory Visit: Payer: Self-pay

## 2024-05-03 ENCOUNTER — Ambulatory Visit: Attending: Cardiology | Admitting: Cardiology

## 2024-05-03 ENCOUNTER — Other Ambulatory Visit: Payer: Self-pay

## 2024-05-03 ENCOUNTER — Encounter (HOSPITAL_BASED_OUTPATIENT_CLINIC_OR_DEPARTMENT_OTHER): Payer: Self-pay | Admitting: Physician Assistant

## 2024-05-03 ENCOUNTER — Other Ambulatory Visit (HOSPITAL_COMMUNITY): Payer: Self-pay

## 2024-05-03 ENCOUNTER — Ambulatory Visit (HOSPITAL_BASED_OUTPATIENT_CLINIC_OR_DEPARTMENT_OTHER): Admitting: Physician Assistant

## 2024-05-03 ENCOUNTER — Encounter: Payer: Self-pay | Admitting: Cardiology

## 2024-05-03 VITALS — BP 142/84 | HR 72 | Ht 64.0 in | Wt 122.6 lb

## 2024-05-03 DIAGNOSIS — M79672 Pain in left foot: Secondary | ICD-10-CM | POA: Diagnosis not present

## 2024-05-03 DIAGNOSIS — I2510 Atherosclerotic heart disease of native coronary artery without angina pectoris: Secondary | ICD-10-CM | POA: Diagnosis not present

## 2024-05-03 DIAGNOSIS — E785 Hyperlipidemia, unspecified: Secondary | ICD-10-CM | POA: Diagnosis not present

## 2024-05-03 DIAGNOSIS — Z7689 Persons encountering health services in other specified circumstances: Secondary | ICD-10-CM

## 2024-05-03 DIAGNOSIS — Z131 Encounter for screening for diabetes mellitus: Secondary | ICD-10-CM

## 2024-05-03 MED ORDER — BLOOD PRESSURE MONITOR AUTOMAT DEVI
1.0000 [IU] | Freq: Once | 0 refills | Status: AC
  Filled 2024-05-03: qty 1, 30d supply, fill #0

## 2024-05-03 NOTE — Progress Notes (Signed)
 Office Visit Note   Patient: Jennifer [name]           Date of Birth: 1961-05-05           MRN: [id number] Visit Date: 05/03/2024              Requested by: Jennifer [name] [address] [place],  [place] [address] PCP: Jennifer [name]  Chief Complaint  Patient presents with   Left Foot - Follow-up      HPI: Jennifer [name] is a pleasant 63 year old woman who works as a Engineer, site for [employer].  I first saw her 2 weeks ago the day after she had a injury to her left foot.  Because of the acuteness of the injury it was difficult to examine.  This was an inversion injury to her foot and ankle.  Most of her pain was over the talonavicular joint x-rays do demonstrate question some small avulsion fractures.  I did place her in a boot asked her to come back today so I can get a better exam.  She says the boot has been helpful.  She has developed quite a bit of ecchymosis after I saw her and has some on the plantar surface of her foot.  Assessment & Plan: Visit Diagnoses:  1. Pain in left foot     Plan: She is now exquisitely tender over the Lisfranc complex.  Also has plantar ecchymosis.  I explained that I would like to get a stat MRI and have her follow-up with Jennifer [name] as I do have some concerns for Lisfranc injury.  In the meantime she can use her boot she listen to pain is her guide elevate and ice as needed.  She does have an upcoming trip to [place] with her husband and her daughter.  In about 8 days.  I think this would be fine for her to do we will get the MRI prior to this.  She understands though that she would not be able to do hiking type activities  Follow-Up Instructions: After MRI  Ortho Exam  Patient is alert, oriented, no adenopathy, well-dressed, normal affect, normal respiratory effort. Examination of her left foot she has palpable pulse she has ecchymosis along the lateral side of her foot as well as on the plantar surface of her midfoot.  She is very tender to  palpation over the Lisfranc complex she is neurologically intact swelling is overall well-controlled    Imaging: No results found. No images are attached to the encounter.  Labs: Lab Results  Component Value Date   ESRSEDRATE 5 12/09/2021   ESRSEDRATE 5 07/28/2020   CRP <1.0 12/09/2021     Lab Results  Component Value Date   ALBUMIN 4.3 02/17/2023   ALBUMIN 4.1 07/19/2022   ALBUMIN 4.7 12/09/2021    No results found for: MG Lab Results  Component Value Date   VD25OH 37.40 01/15/2021   VD25OH 45.52 07/28/2020   VD25OH 29.45 (L) 04/20/2020    No results found for: PREALBUMIN    Latest Ref Rng & Units 02/21/2023    8:58 AM 02/17/2023   10:23 AM 07/19/2022    9:25 AM  CBC EXTENDED  WBC 4.0 - 10.5 K/uL 3.2  2.9  6.7   RBC 3.87 - 5.11 Mil/uL 4.81  4.96  4.98   Hemoglobin 12.0 - 15.0 g/dL 85.4  84.9  84.6   HCT 36.0 - 46.0 % 43.9  45.4  45.5   Platelets 150.0 - 400.0  K/uL 258.0  238.0  242.0   NEUT# 1.4 - 7.7 K/uL 1.7  1.3  5.1   Lymph# 0.7 - 4.0 K/uL 0.8  1.0  0.7      There is no height or weight on file to calculate BMI.  Orders:  Orders Placed This Encounter  Procedures   MR FOOT LEFT WO CONTRAST   No orders of the defined types were placed in this encounter.    Procedures: No procedures performed  Clinical Data: No additional findings.  ROS:  All other systems negative, except as noted in the HPI. Review of Systems  Objective: Vital Signs: There were no vitals taken for this visit.  Specialty Comments:  No specialty comments available.  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   Nonallopathic lesion of sacral region 07/02/2020   Microcytic anemia 04/21/2020   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 04/21/2020   Pain of left hand 02/07/2019   Small bowel obstruction (HCC) 05/21/2018   Popliteus tendinitis of right lower extremity 08/01/2017   GERD (gastroesophageal reflux disease) 03/04/2017   Endometriosis 09/12/1985   Past Medical History:  Diagnosis Date   Anemia    Bowel obstruction (HCC)    Cancer (HCC) 12-16-2021   metastatic squamous cell carcimoma   Fibroids    GERD (gastroesophageal reflux disease)    H/O small bowel obstruction    Hyperlipidemia    no meds, diet controlled   Restless legs syndrome (RLS)     Family History  Problem Relation Age of Onset   Ovarian cancer Mother    Early death Mother    Diabetes Father    Hyperlipidemia Father    Heart disease Father    Cancer - Other Maternal Grandmother        Lymphatic   Breast cancer Paternal Grandmother    COPD Paternal Grandfather    Breast cancer Maternal Aunt     Past Surgical History:  Procedure Laterality Date   ABDOMINAL HYSTERECTOMY     DIAGNOSTIC LAPAROSCOPY     x several with lysis of adhesions   EXPLORATORY LAPAROTOMY     had bowel obstruction   EYE SURGERY Bilateral    Lasik   LARYNGOSCOPY AND ESOPHAGOSCOPY N/A 01/07/2022   Procedure: DIRECT LARYNGOSCOPY WITH BIOPSY; ESOPHAGOSCOPY; FROZEN SECTIONS;  Surgeon: Jennifer Oliphant, MD;  Location: [facility] OR;  Service: ENT;  Laterality: N/A;   ROTATOR CUFF REPAIR Right 2017   SMALL INTESTINE SURGERY  [id number]   [facility] - Dr Jennifer [name]   Social History   Occupational History   Occupation: CMA- Adult nurse GI    Employer: [employer]  Tobacco Use   Smoking status: Former    Current packs/day: 0.00    Average packs/day: 0.3 packs/day for 3.0 years  (0.8 ttl pk-yrs)    Types: Cigarettes    Start date: 09/12/1984    Quit date: 09/13/1987    Years since quitting: 36.6   Smokeless tobacco: Never   Tobacco comments:  smoked 30 years ago in college  Vaping Use   Vaping status: Never Used  Substance and Sexual Activity   Alcohol use: Yes    Comment: Occasionally   Drug use: No   Sexual activity: Yes    Partners: Male    Birth control/protection: Surgical, None    Comment: Hysterectomy

## 2024-05-03 NOTE — Progress Notes (Unsigned)
 Cardiology Office Note:    Date:  05/07/2024   ID:  Jennifer [name], DOB 19-Apr-1961, MRN [id number]  PCP:  Job Lukes, PA  Cardiologist:  Dub Huntsman, DO  Electrophysiologist:  None   Referring MD: Job Lukes, PA    I am ok  History of Present Illness:    Jennifer [name] is a 63 y.o. female with a hx of coronary artery calcification, hyperlipidemia and statin intolerance who presents for management of her lipid levels. She is accompanied by her sister, [name].  She was previously followed by Dr. Alvan and this is her first visit with me.  She is currently on Praluent  every 14 days and takes aspirin 81 mg daily. Her last LDL level was 73 mg/dL, with a target of less than 55 mg/dL. She has not had her LDL checked recently. Her calcium  score from a previous CT was 49.  She is interested in additional testing, such as ApoB and lipoprotein(a), to better understand her cardiovascular risk.  Her blood pressure was 142/84 mmHg during the visit, which is higher than her usual readings. She does not have a blood pressure cuff at home.  She follows a healthy diet, primarily consuming fish and chicken, and eats red meat occasionally. She is mindful of her nutrition and is interested in dietary recommendations to support her cardiovascular health.  Past Medical History:  Diagnosis Date   Anemia    Bowel obstruction (HCC)    Cancer (HCC) 12-16-2021   metastatic squamous cell carcimoma   Fibroids    GERD (gastroesophageal reflux disease)    H/O small bowel obstruction    Hyperlipidemia    no meds, diet controlled   Restless legs syndrome (RLS)     Past Surgical History:  Procedure Laterality Date   ABDOMINAL HYSTERECTOMY     DIAGNOSTIC LAPAROSCOPY     x several with lysis of adhesions   EXPLORATORY LAPAROTOMY     had bowel obstruction   EYE SURGERY Bilateral    Lasik   LARYNGOSCOPY AND ESOPHAGOSCOPY N/A 01/07/2022   Procedure: DIRECT LARYNGOSCOPY WITH BIOPSY;  ESOPHAGOSCOPY; FROZEN SECTIONS;  Surgeon: Jesus Oliphant, MD;  Location: [facility] OR;  Service: ENT;  Laterality: N/A;   ROTATOR CUFF REPAIR Right 2017   SMALL INTESTINE SURGERY  [id number]   [facility] - Dr Janeane    Current Medications: Current Meds  Medication Sig   Alirocumab  (PRALUENT ) 75 MG/ML SOAJ Inject 1 mL (75 mg total) into the skin every 14 (fourteen) days.   aspirin EC 81 MG tablet Take 81 mg by mouth daily.   [EXPIRED] Blood Pressure Monitoring (BLOOD PRESSURE MONITOR AUTOMAT) DEVI Use as directed to monitor blood pressure.   calcium  elemental as carbonate (BARIATRIC TUMS ULTRA) 400 MG chewable tablet Chew 1,000 mg by mouth daily.   cholecalciferol (VITAMIN D3) 25 MCG (1000 UNIT) tablet Take 1,000 Units by mouth daily.   Coenzyme Q10 (COQ10) 200 MG CAPS Take 1 tablet by mouth daily.   estradiol  (ESTRACE ) 0.1 MG/GM vaginal cream Apply intravaginally once daily for 2 weeks, then decrease to 1 to 3 times weekly   ferrous sulfate 325 (65 FE) MG tablet Take 325 mg by mouth 3 (three) times a week.    ibuprofen (ADVIL,MOTRIN) 200 MG tablet Take 400 mg by mouth every 6 (six) hours as needed for mild pain.   Lotilaner  (XDEMVY ) 0.25 % SOLN INSTILL 1 DROP IN LEFT EYE TWICE A DAY   Misc Natural Products (PROGESTERONE  EX) Apply 5 drops topically  at bedtime. Bioidentical progesterone  compounded solution   zolpidem  (AMBIEN ) 5 MG tablet TAKE 1 TABLET BY MOUTH AT BEDTIME AS NEEDED FOR SLEEP     Allergies:   Atorvastatin , Codeine, Hydrocodone , Oxycodone , and Zetia  [ezetimibe ]   Social History   Socioeconomic History   Marital status: Married    Spouse name: Not on file   Number of children: 3   Years of education: Not on file   Highest education level: Not on file  Occupational History   Occupation: CMA- Adult nurse GI    Employer: [employer]  Tobacco Use   Smoking status: Former    Current packs/day: 0.00    Average packs/day: 0.3 packs/day for 3.0 years (0.8 ttl pk-yrs)    Types:  Cigarettes    Start date: 09/12/1984    Quit date: 09/13/1987    Years since quitting: 36.6   Smokeless tobacco: Never   Tobacco comments:    smoked 30 years ago in college  Vaping Use   Vaping status: Never Used  Substance and Sexual Activity   Alcohol use: Yes    Comment: Occasionally   Drug use: No   Sexual activity: Yes    Partners: Male    Birth control/protection: Surgical, None    Comment: Hysterectomy  Other Topics Concern   Not on file  Social History Narrative   CMA with [employer]   Social Drivers of Health   Financial Resource Strain: Not on file  Food Insecurity: No Food Insecurity (01/20/2022)   Hunger Vital Sign    Worried About Running Out of Food in the Last Year: Never true    Ran Out of Food in the Last Year: Never true  Transportation Needs: No Transportation Needs (01/20/2022)   PRAPARE - Administrator, Civil Service (Medical): No    Lack of Transportation (Non-Medical): No  Physical Activity: Not on file  Stress: Not on file  Social Connections: Not on file     Family History: The patient's family history includes Breast cancer in her maternal aunt and paternal grandmother; COPD in her paternal grandfather; Cancer - Other in her maternal grandmother; Diabetes in her father; Early death in her mother; Heart disease in her father; Hyperlipidemia in her father; Ovarian cancer in her mother.  ROS:   Review of Systems  Constitution: Negative for decreased appetite, fever and weight gain.  HENT: Negative for congestion, ear discharge, hoarse voice and sore throat.   Eyes: Negative for discharge, redness, vision loss in right eye and visual halos.  Cardiovascular: Negative for chest pain, dyspnea on exertion, leg swelling, orthopnea and palpitations.  Respiratory: Negative for cough, hemoptysis, shortness of breath and snoring.   Endocrine: Negative for heat intolerance and polyphagia.  Hematologic/Lymphatic: Negative for bleeding problem. Does  not bruise/bleed easily.  Skin: Negative for flushing, nail changes, rash and suspicious lesions.  Musculoskeletal: Negative for arthritis, joint pain, muscle cramps, myalgias, neck pain and stiffness.  Gastrointestinal: Negative for abdominal pain, bowel incontinence, diarrhea and excessive appetite.  Genitourinary: Negative for decreased libido, genital sores and incomplete emptying.  Neurological: Negative for brief paralysis, focal weakness, headaches and loss of balance.  Psychiatric/Behavioral: Negative for altered mental status, depression and suicidal ideas.  Allergic/Immunologic: Negative for HIV exposure and persistent infections.    EKGs/Labs/Other Studies Reviewed:    The following studies were reviewed today:   EKG:  The ekg ordered today demonstrates sinus rhythm  Recent Labs: No results found for requested labs within last 365 days.  Recent Lipid Panel    Component Value Date/Time   CHOL 176 02/17/2023 1023   CHOL 232 (H) 10/28/2022 1007   TRIG 53.0 02/17/2023 1023   HDL 91.80 02/17/2023 1023   HDL 98 10/28/2022 1007   CHOLHDL 2 02/17/2023 1023   VLDL 10.6 02/17/2023 1023   LDLCALC 73 02/17/2023 1023   LDLCALC 124 (H) 10/28/2022 1007    Physical Exam:    VS:  BP (!) 142/84   Pulse 72   Ht 5' 4 (1.626 m)   Wt 122 lb 9.6 oz (55.6 kg)   SpO2 97%   BMI 21.04 kg/m     Wt Readings from Last 3 Encounters:  05/03/24 122 lb 9.6 oz (55.6 kg)  03/04/24 118 lb (53.5 kg)  12/26/23 121 lb 8 oz (55.1 kg)     GEN: Well nourished, well developed in no acute distress HEENT: Normal NECK: No JVD; No carotid bruits LYMPHATICS: No lymphadenopathy CARDIAC: S1S2 noted,RRR, no murmurs, rubs, gallops RESPIRATORY:  Clear to auscultation without rales, wheezing or rhonchi  ABDOMEN: Soft, non-tender, non-distended, +bowel sounds, no guarding. EXTREMITIES: No edema, No cyanosis, no clubbing MUSCULOSKELETAL:  No deformity  SKIN: Warm and dry NEUROLOGIC:  Alert and oriented  x 3, non-focal PSYCHIATRIC:  Normal affect, good insight  ASSESSMENT:    1. Encounter to establish care   2. Coronary artery calcification   3. Screening for diabetes mellitus (DM)   4. Hyperlipidemia, unspecified hyperlipidemia type    PLAN:    Hyperlipidemia Hyperlipidemia with statin intolerance. Current LDL goal is less than 55 mg/dL. Last LDL was 73 mg/dL. On Praluent  and aspirin. - Order lipid panel along with  ApoB and lipoprotein(a). - Screen for diabetes. - Consider Zetia  10 mg daily if LDL not less than 55 mg/dL.   Elevated blood pressure Elevated office blood pressure, suspected white coat hypertension. Home monitoring required. - Prescribe home blood pressure cuff. - Monitor blood pressure twice daily for two weeks. - Report readings over 130 mmHg for three days or over 140 mmHg immediately. - Discuss further evaluation if home readings consistently high.  The patient is in agreement with the above plan. The patient left the office in stable condition.  The patient will follow up in   Medication Adjustments/Labs and Tests Ordered: Current medicines are reviewed at length with the patient today.  Concerns regarding medicines are outlined above.  Orders Placed This Encounter  Procedures   Lipoprotein A (LPA)   Apolipoprotein B   Hemoglobin A1c   Lipid Profile   EKG 12-Lead   Meds ordered this encounter  Medications   Blood Pressure Monitoring (BLOOD PRESSURE MONITOR AUTOMAT) DEVI    Sig: Use as directed to monitor blood pressure.    Dispense:  1 each    Refill:  0    Patient Instructions  Medication Instructions:  Your physician recommends that you continue on your current medications as directed. Please refer to the Current Medication list given to you today.  *If you need a refill on your cardiac medications before your next appointment, please call your pharmacy*  Lab Work: HgbA1c, Lipids, APOB, Lp(a)  If you have labs (blood work) drawn today and  your tests are completely normal, you will receive your results only by: MyChart Message (if you have MyChart) OR A paper copy in the mail If you have any lab test that is abnormal or we need to change your treatment, we will call you to review the results.   Follow-Up:  At [facility], you and your health needs are our priority.  As part of our continuing mission to provide you with exceptional heart care, our providers are all part of one team.  This team includes your primary Cardiologist (physician) and Advanced Practice Providers or APPs (Physician Assistants and Nurse Practitioners) who all work together to provide you with the care you need, when you need it.  Your next appointment:   1 year(s)  Provider:   Abilene Mcphee, DO    Other Instructions Please take your blood pressure daily for 2 weeks and send in a MyChart message. Please include heart rates. (One message at the end of the 2 weeks).   HOW TO TAKE YOUR BLOOD PRESSURE: Rest 5 minutes before taking your blood pressure. Don't smoke or drink caffeinated beverages for at least 30 minutes before. Take your blood pressure before (not after) you eat. Sit comfortably with your back supported and both feet on the floor (don't cross your legs). Elevate your arm to heart level on a table or a desk. Use the proper sized cuff. It should fit smoothly and snugly around your bare upper arm. There should be enough room to slip a fingertip under the cuff. The bottom edge of the cuff should be 1 inch above the crease of the elbow. Ideally, take 3 measurements at one sitting and record the average.        Adopting a Healthy Lifestyle.  Know what a healthy weight is for you (roughly BMI <25) and aim to maintain this   Aim for 7+ servings of fruits and vegetables daily   65-80+ fluid ounces of water or unsweet tea for healthy kidneys   Limit to max 1 drink of alcohol per day; avoid smoking/tobacco   Limit animal fats in diet  for cholesterol and heart health - choose grass fed whenever available   Avoid highly processed foods, and foods high in saturated/trans fats   Aim for low stress - take time to unwind and care for your mental health   Aim for 150 min of moderate intensity exercise weekly for heart health, and weights twice weekly for bone health   Aim for 7-9 hours of sleep daily   When it comes to diets, agreement about the perfect plan isnt easy to find, even among the experts. Experts at the [facility] [employer] developed an idea known as the Healthy Eating Plate. Just imagine a plate divided into logical, healthy portions.   The emphasis is on diet quality:   Load up on vegetables and fruits - one-half of your plate: Aim for color and variety, and remember that potatoes dont count.   Go for whole grains - one-quarter of your plate: Whole wheat, barley, wheat berries, quinoa, oats, brown rice, and foods made with them. If you want pasta, go with whole wheat pasta.   Protein power - one-quarter of your plate: Fish, chicken, beans, and nuts are all healthy, versatile protein sources. Limit red meat.   The diet, however, does go beyond the plate, offering a few other suggestions.   Use healthy plant oils, such as olive, canola, soy, corn, sunflower and peanut. Check the labels, and avoid partially hydrogenated oil, which have unhealthy trans fats.   If youre thirsty, drink water. Coffee and tea are good in moderation, but skip sugary drinks and limit milk and dairy products to one or two daily servings.   The type of carbohydrate in the diet is more important  than the amount. Some sources of carbohydrates, such as vegetables, fruits, whole grains, and beans-are healthier than others.   Finally, stay active  Signed, Dub Huntsman, DO  05/07/2024 1:09 PM    [facility]

## 2024-05-03 NOTE — Progress Notes (Signed)
 Patient called and spoke to [facility] late Thursday to inform of continued issues in obtaining Xdemvy  and that it had been transferred to [facility]. Spoke to [facility] this morning who could not reverse the transfer. Spoke to Pharmacist Marinda HERO at [facility] who stated that it was a referral only and he would cancel it. Brought Pharmacist Brandon J from [facility] on to the same call to advise that they may proceed in processing. Spoke with multiple additional representatives at [facility] and learned that override needed to be placed for a specific location. Luke SQUIBB assisted with override for location [phone]. Patient was enrolled in copayment card and after multiple additional representatives, Tess HERO in insurance verification confirmed that supervisor Delon Fell had a live claim in place which would ship on Monday 8/25 to patient for arrival 8/26. Patient took part in calls during various steps of the process to confirm her delivery and payment information. No additional issues are anticipated but [facility] as well as patient have been informed to contact me directly if anything else may arise.

## 2024-05-03 NOTE — Patient Instructions (Signed)
 Medication Instructions:  Your physician recommends that you continue on your current medications as directed. Please refer to the Current Medication list given to you today.  *If you need a refill on your cardiac medications before your next appointment, please call your pharmacy*  Lab Work: HgbA1c, Lipids, APOB, Lp(a)  If you have labs (blood work) drawn today and your tests are completely normal, you will receive your results only by: MyChart Message (if you have MyChart) OR A paper copy in the mail If you have any lab test that is abnormal or we need to change your treatment, we will call you to review the results.   Follow-Up: At [facility], you and your health needs are our priority.  As part of our continuing mission to provide you with exceptional heart care, our providers are all part of one team.  This team includes your primary Cardiologist (physician) and Advanced Practice Providers or APPs (Physician Assistants and Nurse Practitioners) who all work together to provide you with the care you need, when you need it.  Your next appointment:   1 year(s)  Provider:   Kardie Tobb, DO    Other Instructions Please take your blood pressure daily for 2 weeks and send in a MyChart message. Please include heart rates. (One message at the end of the 2 weeks).   HOW TO TAKE YOUR BLOOD PRESSURE: Rest 5 minutes before taking your blood pressure. Don't smoke or drink caffeinated beverages for at least 30 minutes before. Take your blood pressure before (not after) you eat. Sit comfortably with your back supported and both feet on the floor (don't cross your legs). Elevate your arm to heart level on a table or a desk. Use the proper sized cuff. It should fit smoothly and snugly around your bare upper arm. There should be enough room to slip a fingertip under the cuff. The bottom edge of the cuff should be 1 inch above the crease of the elbow. Ideally, take 3 measurements at one  sitting and record the average.

## 2024-05-04 ENCOUNTER — Ambulatory Visit
Admission: RE | Admit: 2024-05-04 | Discharge: 2024-05-04 | Disposition: A | Discharge status: Home / Self Care | Source: Ambulatory Visit | Attending: Physician Assistant | Admitting: Physician Assistant

## 2024-05-04 DIAGNOSIS — M79672 Pain in left foot: Secondary | ICD-10-CM

## 2024-05-04 DIAGNOSIS — S99922A Unspecified injury of left foot, initial encounter: Secondary | ICD-10-CM | POA: Diagnosis not present

## 2024-05-05 ENCOUNTER — Ambulatory Visit: Payer: Self-pay | Admitting: Gastroenterology

## 2024-05-05 ENCOUNTER — Encounter (HOSPITAL_BASED_OUTPATIENT_CLINIC_OR_DEPARTMENT_OTHER): Payer: Self-pay

## 2024-05-06 ENCOUNTER — Encounter: Payer: Self-pay | Admitting: Cardiology

## 2024-05-07 ENCOUNTER — Encounter: Payer: Self-pay | Admitting: Physician Assistant

## 2024-05-07 ENCOUNTER — Ambulatory Visit: Admitting: Physician Assistant

## 2024-05-07 VITALS — BP 122/70 | HR 74 | Temp 97.3°F | Ht 64.0 in | Wt 120.4 lb

## 2024-05-07 DIAGNOSIS — Z23 Encounter for immunization: Secondary | ICD-10-CM | POA: Diagnosis not present

## 2024-05-07 DIAGNOSIS — G4700 Insomnia, unspecified: Secondary | ICD-10-CM | POA: Diagnosis not present

## 2024-05-07 DIAGNOSIS — E785 Hyperlipidemia, unspecified: Secondary | ICD-10-CM | POA: Diagnosis not present

## 2024-05-07 DIAGNOSIS — D508 Other iron deficiency anemias: Secondary | ICD-10-CM

## 2024-05-07 DIAGNOSIS — Z0000 Encounter for general adult medical examination without abnormal findings: Secondary | ICD-10-CM | POA: Diagnosis not present

## 2024-05-07 DIAGNOSIS — R931 Abnormal findings on diagnostic imaging of heart and coronary circulation: Secondary | ICD-10-CM

## 2024-05-07 NOTE — Progress Notes (Signed)
 Subjective:    Jennifer [name] is a 63 y.o. female and is here for a comprehensive physical exam.  HPI  There are no preventive care reminders to display for this patient.  Discussed the use of AI scribe software for clinical note transcription with the patient, who gave verbal consent to proceed.  History of Present Illness Jennifer [name] Jennifer [name] is a 63 year old female who presents for Comprehensive Physical Exam (CPE) preventive care annual visit.  She tripped on the steps while wearing flip flops, thinking she was at the bottom step, and fell, resulting in a foot injury. Immediate pain and swelling occurred, and she was unable to walk on the foot. An x-ray the following morning revealed avulsion fractures on the top of her foot. She was advised to wear a boot instead of a cast. A week and a half later, significant bruising appeared on the bottom of her foot, accompanied by severe pain when pressure was applied to a specific spot. An MRI showed a possible nondisplaced fracture of the medial cuneiform, marrow edema, contusions, tenosynovitis, and a partial tear of the posterior tibial tendon. A ganglion cyst was also noted.  She is planning a trip to [place] and is concerned about wearing the boot during her travels, especially since the trip involves a lot of walking and hiking. She is unsure about the duration for which she needs to wear the boot and when she can start putting pressure on her foot without it.  She is on Praluent  75 mg every two weeks for elevated LDL cholesterol and experienced leg pain with previous statin use, which was resolved by taking CoQ10. She is hopeful that her LDL levels have improved with the current treatment. She is taking calcium  carbonate and vitamin D3 supplements for osteopenia and engages in strength training. She reports normal blood pressure readings at home despite a recent elevated reading during a visit. She takes Ambien  occasionally for sleep issues  and uses her estrace  cream sporadically. She is considering resuming blood donation and takes iron supplements three times a week due to a previously low ferritin level. She also takes vitamin D3 1000 IU daily.    Health Maintenance: Immunizations -- UpToDate; completed pneumonia shot today Colonoscopy -- UpToDate Mammogram -- UpToDate  PAP -- n/a Bone Density -- reviewed; UpToDate  Diet -- healthy diet Exercise -- very active  Sleep habits -- no concerns Mood -- stable  UTD with dentist? - yes UTD with eye doctor? - yes  Weight history: Wt Readings from Last 10 Encounters:  05/07/24 120 lb 6.1 oz (54.6 kg)  05/03/24 122 lb 9.6 oz (55.6 kg)  03/04/24 118 lb (53.5 kg)  12/26/23 121 lb 8 oz (55.1 kg)  03/24/23 118 lb 9 oz (53.8 kg)  02/17/23 118 lb 8 oz (53.8 kg)  11/04/22 119 lb (54 kg)  08/23/22 119 lb (54 kg)  08/08/22 118 lb 9.6 oz (53.8 kg)  07/08/22 118 lb 9.6 oz (53.8 kg)   Body mass index is 20.66 kg/m. No LMP recorded. Patient has had a hysterectomy.  Alcohol use:  reports current alcohol use.  Tobacco use:  Tobacco Use: Medium Risk (05/07/2024)   Patient History    Smoking Tobacco Use: Former    Smokeless Tobacco Use: Never    Passive Exposure: Not on file   Eligible for lung cancer screening? no     05/07/2024    1:07 PM  Depression screen PHQ 2/9  Decreased Interest 0  Down, Depressed, Hopeless 0  PHQ - 2 Score 0     Other providers/specialists: Patient Care Team: Jennifer [name], Jennifer [name] as PCP - General (Physician Assistant) Jennifer Pugh, DO as PCP - Cardiology (Cardiology) Malmfelt, Delon CROME, RN as Oncology Nurse Navigator Jennifer Domino, MD as Consulting Physician (Radiation Oncology) Jennifer Chew, MD as Consulting Physician (Otolaryngology) Jennifer [name], CCC-SLP as Speech Language Pathologist (Speech Pathology)    PMHx, SurgHx, SocialHx, Medications, and Allergies were reviewed in the Visit Navigator and updated as appropriate.    Past Medical History:  Diagnosis Date   Anemia    Bowel obstruction (HCC)    Cancer (HCC) 12-16-2021   metastatic squamous cell carcimoma   Fibroids    GERD (gastroesophageal reflux disease)    H/O small bowel obstruction    Hyperlipidemia    no meds, diet controlled   Restless legs syndrome (RLS)      Past Surgical History:  Procedure Laterality Date   ABDOMINAL HYSTERECTOMY     DIAGNOSTIC LAPAROSCOPY     x several with lysis of adhesions   EXPLORATORY LAPAROTOMY     had bowel obstruction   EYE SURGERY Bilateral    Lasik   LARYNGOSCOPY AND ESOPHAGOSCOPY N/A 01/07/2022   Procedure: DIRECT LARYNGOSCOPY WITH BIOPSY; ESOPHAGOSCOPY; FROZEN SECTIONS;  Surgeon: Jesus Oliphant, MD;  Location: [facility] OR;  Service: ENT;  Laterality: N/A;   ROTATOR CUFF REPAIR Right 2017   SMALL INTESTINE SURGERY  [id number]   [facility] - Dr Janeane     Family History  Problem Relation Age of Onset   Ovarian cancer Mother    Early death Mother    Diabetes Father    Hyperlipidemia Father    Heart disease Father    Cancer - Other Maternal Grandmother        Lymphatic   Breast cancer Paternal Grandmother    COPD Paternal Grandfather    Breast cancer Maternal Aunt     Social History   Tobacco Use   Smoking status: Former    Current packs/day: 0.00    Average packs/day: 0.3 packs/day for 3.0 years (0.8 ttl pk-yrs)    Types: Cigarettes    Start date: 09/12/1984    Quit date: 09/13/1987    Years since quitting: 36.6   Smokeless tobacco: Never   Tobacco comments:    smoked 30 years ago in college  Vaping Use   Vaping status: Never Used  Substance Use Topics   Alcohol use: Yes    Comment: Occasionally   Drug use: No    Review of Systems:   Review of Systems  Constitutional:  Negative for chills, fever, malaise/fatigue and weight loss.  HENT:  Negative for hearing loss, sinus pain and sore throat.   Respiratory:  Negative for cough and hemoptysis.   Cardiovascular:  Negative for chest  pain, palpitations, leg swelling and PND.  Gastrointestinal:  Negative for abdominal pain, constipation, diarrhea, heartburn, nausea and vomiting.  Genitourinary:  Negative for dysuria, frequency and urgency.  Musculoskeletal:  Negative for back pain, myalgias and neck pain.  Skin:  Negative for itching and rash.  Neurological:  Negative for dizziness, tingling, seizures and headaches.  Endo/Heme/Allergies:  Negative for polydipsia.  Psychiatric/Behavioral:  Negative for depression. The patient is not nervous/anxious.     Objective:   BP 122/70 (BP Location: Left Arm, Patient Position: Sitting, Cuff Size: Normal)   Pulse 74   Temp (!) 97.3 F (36.3 C) (Temporal)   Ht 5' 4 (1.626 m)  Wt 120 lb 6.1 oz (54.6 kg)   SpO2 98%   BMI 20.66 kg/m  Body mass index is 20.66 kg/m.   General Appearance:    Alert, cooperative, no distress, appears stated age  Head:    Normocephalic, without obvious abnormality, atraumatic  Eyes:    PERRL, conjunctiva/corneas clear, EOM's intact, fundi    benign, both eyes  Ears:    Normal TM's and external ear canals, both ears  Nose:   Nares normal, septum midline, mucosa normal, no drainage    or sinus tenderness  Throat:   Lips, mucosa, and tongue normal; teeth and gums normal  Neck:   Supple, symmetrical, trachea midline, no adenopathy;    thyroid :  no enlargement/tenderness/nodules; no carotid   bruit or JVD  Back:     Symmetric, no curvature, ROM normal, no CVA tenderness  Lungs:     Clear to auscultation bilaterally, respirations unlabored  Chest Wall:    No tenderness or deformity   Heart:    Regular rate and rhythm, S1 and S2 normal, no murmur, rub or gallop  Breast Exam:    Deferred  Abdomen:     Soft, non-tender, bowel sounds active all four quadrants,    no masses, no organomegaly  Genitalia:    Deferred  Extremities:   Extremities normal, atraumatic, no cyanosis or edema Right foot in boot  Pulses:   2+ and symmetric all extremities   Skin:   Skin color, texture, turgor normal, no rashes or lesions  Lymph nodes:   Cervical, supraclavicular, and axillary nodes normal  Neurologic:   CNII-XII intact, normal strength, sensation and reflexes    throughout    Assessment/Plan:   Assessment and Plan Assessment & Plan Adult Wellness Visit Routine wellness visit with recent elevated blood pressure, normal at home. Discussed vitamin D  absorption, calcium  supplementation, and osteopenia management. - Administer pneumonia vaccine. - update blood work  - Continue calcium  carbonate 600 mg daily. - Continue vitamin D3 1000 IU daily with food. - Encourage strength training exercises.  Right foot injury Right foot injury with possible nondisplaced fracture, marrow edema, tenosynovitis, and partial tendon tear. MRI showed no Lisfranc injury. No surgery required. - Continue wearing the boot. - Avoid weight-bearing activities. - continue close follow up with orthopedics   Osteopenia Osteopenia confirmed by DEXA scan with worsening T-scores. Family history of osteoporosis. FRAX score indicates increased risk. - Continue calcium  carbonate 600 mg daily. - Continue vitamin D3 1000 IU daily with food. - Encourage strength training exercises. - Repeat DEXA scan in 2 years.  Hyperlipidemia Hyperlipidemia managed with Repatha . Previous statin therapy caused myalgia. Awaiting lipid panel results. Goal LDL is 55 mg/dL. - Order lipid panel. - Continue Repatha  75 mg every 2 weeks. - Continue CoQ10 200 mg daily. - Consider increasing Repatha  dose to 150 mg every 2 weeks if LDL goal is not met.  Iron deficiency (history of low ferritin) Low ferritin and hair thinning. Discussed importance of regular iron supplementation. - Continue ferrous sulfate 325 mg 3 times a week. - Monitor ferritin levels.  Postmenopausal atrophic vaginitis with dyspareunia Intermittent use of estradiol  vaginal cream improved dyspareunia. Discussed importance of  consistent application. - Encourage regular use of estradiol  vaginal cream as prescribed.  Intermittent insomnia Intermittent insomnia managed with zolpidem  as needed. - Continue zolpidem  5 mg as needed for sleep.   Lucie Buttner, PA-C [facility]

## 2024-05-08 DIAGNOSIS — D485 Neoplasm of uncertain behavior of skin: Secondary | ICD-10-CM | POA: Diagnosis not present

## 2024-05-08 DIAGNOSIS — L814 Other melanin hyperpigmentation: Secondary | ICD-10-CM | POA: Diagnosis not present

## 2024-05-08 DIAGNOSIS — L821 Other seborrheic keratosis: Secondary | ICD-10-CM | POA: Diagnosis not present

## 2024-05-08 DIAGNOSIS — Z85828 Personal history of other malignant neoplasm of skin: Secondary | ICD-10-CM | POA: Diagnosis not present

## 2024-05-09 ENCOUNTER — Other Ambulatory Visit (INDEPENDENT_AMBULATORY_CARE_PROVIDER_SITE_OTHER)

## 2024-05-09 DIAGNOSIS — D508 Other iron deficiency anemias: Secondary | ICD-10-CM

## 2024-05-09 DIAGNOSIS — E785 Hyperlipidemia, unspecified: Secondary | ICD-10-CM

## 2024-05-09 DIAGNOSIS — Z0000 Encounter for general adult medical examination without abnormal findings: Secondary | ICD-10-CM | POA: Diagnosis not present

## 2024-05-09 DIAGNOSIS — R931 Abnormal findings on diagnostic imaging of heart and coronary circulation: Secondary | ICD-10-CM | POA: Diagnosis not present

## 2024-05-09 LAB — COMPREHENSIVE METABOLIC PANEL WITH GFR
ALT: 16 U/L (ref 0–35)
AST: 22 U/L (ref 0–37)
Albumin: 4.1 g/dL (ref 3.5–5.2)
Alkaline Phosphatase: 49 U/L (ref 39–117)
BUN: 13 mg/dL (ref 6–23)
CO2: 30 meq/L (ref 19–32)
Calcium: 8.9 mg/dL (ref 8.4–10.5)
Chloride: 103 meq/L (ref 96–112)
Creatinine, Ser: 0.87 mg/dL (ref 0.40–1.20)
GFR: 72.24 mL/min (ref >60.00)
Glucose, Bld: 84 mg/dL (ref 70–99)
Potassium: 4 meq/L (ref 3.5–5.1)
Sodium: 142 meq/L (ref 135–145)
Total Bilirubin: 0.5 mg/dL (ref 0.2–1.2)
Total Protein: 6.4 g/dL (ref 6.0–8.3)

## 2024-05-09 LAB — CBC WITH DIFFERENTIAL/PLATELET
Basophils Absolute: 0.1 K/uL (ref 0.0–0.1)
Basophils Relative: 1.9 % (ref 0.0–3.0)
Eosinophils Absolute: 0.1 K/uL (ref 0.0–0.7)
Eosinophils Relative: 4.7 % (ref 0.0–5.0)
HCT: 42.1 % (ref 36.0–46.0)
Hemoglobin: 14.1 g/dL (ref 12.0–15.0)
Lymphocytes Relative: 27.7 % (ref 12.0–46.0)
Lymphs Abs: 0.8 K/uL (ref 0.7–4.0)
MCHC: 33.6 g/dL (ref 30.0–36.0)
MCV: 90.4 fl (ref 78.0–100.0)
Monocytes Absolute: 0.4 K/uL (ref 0.1–1.0)
Monocytes Relative: 14 % — ABNORMAL HIGH (ref 3.0–12.0)
Neutro Abs: 1.6 K/uL (ref 1.4–7.7)
Neutrophils Relative %: 51.7 % (ref 43.0–77.0)
Platelets: 238 K/uL (ref 150.0–400.0)
RBC: 4.65 Mil/uL (ref 3.87–5.11)
RDW: 13 % (ref 11.5–15.5)
WBC: 3 K/uL — ABNORMAL LOW (ref 4.0–10.5)

## 2024-05-09 LAB — TSH: TSH: 5.88 u[IU]/mL — ABNORMAL HIGH (ref 0.35–5.50)

## 2024-05-09 LAB — IBC + FERRITIN
Ferritin: 117.3 ng/mL (ref 10.0–291.0)
Iron: 113 ug/dL (ref 42–145)
Saturation Ratios: 35.2 % (ref 20.0–50.0)
TIBC: 320.6 ug/dL (ref 250.0–450.0)
Transferrin: 229 mg/dL (ref 212.0–360.0)

## 2024-05-09 LAB — LIPID PANEL
Cholesterol: 171 mg/dL (ref 0–200)
HDL: 87.2 mg/dL (ref >39.00)
LDL Cholesterol: 73 mg/dL (ref 0–99)
NonHDL: 83.36
Total CHOL/HDL Ratio: 2
Triglycerides: 53 mg/dL (ref 0.0–149.0)
VLDL: 10.6 mg/dL (ref 0.0–40.0)

## 2024-05-09 LAB — HEMOGLOBIN A1C: Hgb A1c MFr Bld: 5.7 % (ref 4.6–6.5)

## 2024-05-10 ENCOUNTER — Ambulatory Visit: Payer: Self-pay | Admitting: Physician Assistant

## 2024-05-10 ENCOUNTER — Telehealth: Payer: Self-pay | Admitting: *Deleted

## 2024-05-10 DIAGNOSIS — R7989 Other specified abnormal findings of blood chemistry: Secondary | ICD-10-CM

## 2024-05-10 LAB — APOLIPOPROTEIN B: Apolipoprotein B: 60 mg/dL (ref <90)

## 2024-05-10 NOTE — Telephone Encounter (Signed)
 Please see message and advise

## 2024-05-10 NOTE — Telephone Encounter (Signed)
 Copied from CRM [id number]. Topic: Clinical - Medical Advice >> May 09, 2024  4:12 PM Harlene ORN wrote: Reason for CRM: Patient calling was just seen by her PCP for her foot pain and was prescribed 325 mg Aspirin. Wants to know if she can also take Ibuprofen with the Aspirin while she is travelling to numb her foot pain?

## 2024-05-11 LAB — LIPOPROTEIN A (LPA): Lipoprotein (a): <10 nmol/L (ref <75)

## 2024-05-14 NOTE — Telephone Encounter (Signed)
 Noted

## 2024-05-21 ENCOUNTER — Encounter: Payer: Self-pay | Admitting: Cardiology

## 2024-05-24 ENCOUNTER — Ambulatory Visit: Payer: Self-pay | Admitting: Physician Assistant

## 2024-05-24 ENCOUNTER — Ambulatory Visit (HOSPITAL_BASED_OUTPATIENT_CLINIC_OR_DEPARTMENT_OTHER): Admitting: Student

## 2024-05-24 ENCOUNTER — Other Ambulatory Visit

## 2024-05-24 DIAGNOSIS — R7989 Other specified abnormal findings of blood chemistry: Secondary | ICD-10-CM

## 2024-05-24 DIAGNOSIS — M79672 Pain in left foot: Secondary | ICD-10-CM

## 2024-05-24 LAB — T3, FREE: T3, Free: 3.7 pg/mL (ref 2.3–4.2)

## 2024-05-24 LAB — TSH: TSH: 3.62 u[IU]/mL (ref 0.35–5.50)

## 2024-05-24 LAB — T4, FREE: Free T4: 0.67 ng/dL (ref 0.60–1.60)

## 2024-05-24 NOTE — Progress Notes (Unsigned)
 Chief Complaint: Left foot pain     History of Present Illness:    Jennifer [name] is a pleasant 63 y.o. female who presents today following up on left foot pain.  This has been attributed to an inversion injury she sustained on 8/10.  She has been seen by my colleague Ronal Dragon and was initially immobilized in a boot.  After symptoms and ecchymosis persisted after the first 2 weeks, and an MRI was ordered to help rule out a Lisfranc injury and further assess the soft tissues.  She is here today to follow-up on the MRI.  Patient recently returned from a trip to [place] and states that the boot did help her mobility, however she was unable to go on any of her planned hikes.  Continues to experience swelling within the foot as well as moderate discomfort with weightbearing when out of the boot.  She works as a Engineer, site at [employer].   Surgical History:   None  PMH/PSH/Family History/Social History/Meds/Allergies:    Past Medical History:  Diagnosis Date   Anemia    Bowel obstruction (HCC)    Cancer (HCC) 12-16-2021   metastatic squamous cell carcimoma   Fibroids    GERD (gastroesophageal reflux disease)    H/O small bowel obstruction    Hyperlipidemia    no meds, diet controlled   Restless legs syndrome (RLS)    Past Surgical History:  Procedure Laterality Date   ABDOMINAL HYSTERECTOMY     DIAGNOSTIC LAPAROSCOPY     x several with lysis of adhesions   EXPLORATORY LAPAROTOMY     had bowel obstruction   EYE SURGERY Bilateral    Lasik   LARYNGOSCOPY AND ESOPHAGOSCOPY N/A 01/07/2022   Procedure: DIRECT LARYNGOSCOPY WITH BIOPSY; ESOPHAGOSCOPY; FROZEN SECTIONS;  Surgeon: Jesus Oliphant, MD;  Location[facility] OR;  Service: ENT;  Laterality: N/A;   ROTATOR CUFF REPAIR Right 2017   SMALL INTESTINE SURGERY  [id number]   [facility] - Dr Janeane   Social History   Socioeconomic History   Marital status: Married    Spouse name: Not on file   Number  of children: 3   Years of education: Not on file   Highest education level: Not on file  Occupational History   Occupation: CMA- Adult nurse GI    Employer: [employer]  Tobacco Use   Smoking status: Former    Current packs/day: 0.00    Average packs/day: 0.3 packs/day for 3.0 years (0.8 ttl pk-yrs)    Types: Cigarettes    Start date: 09/12/1984    Quit date: 09/13/1987    Years since quitting: 36.7   Smokeless tobacco: Never   Tobacco comments:    smoked 30 years ago in college  Vaping Use   Vaping status: Never Used  Substance and Sexual Activity   Alcohol use: Yes    Comment: Occasionally   Drug use: No   Sexual activity: Yes    Partners: Male    Birth control/protection: Surgical, None    Comment: Hysterectomy  Other Topics Concern   Not on file  Social History Narrative   CMA with Adult nurse GI   Social Drivers of Health   Financial Resource Strain: Not on file  Food Insecurity: No Food Insecurity (01/20/2022)   Hunger Vital Sign    Worried  About Running Out of Food in the Last Year: Never true    Ran Out of Food in the Last Year: Never true  Transportation Needs: No Transportation Needs (01/20/2022)   PRAPARE - Administrator, Civil Service (Medical): No    Lack of Transportation (Non-Medical): No  Physical Activity: Not on file  Stress: Not on file  Social Connections: Not on file   Family History  Problem Relation Age of Onset   Ovarian cancer Mother    Early death Mother    Diabetes Father    Hyperlipidemia Father    Heart disease Father    Cancer - Other Maternal Grandmother        Lymphatic   Breast cancer Paternal Grandmother    COPD Paternal Grandfather    Breast cancer Maternal Aunt    Allergies  Allergen Reactions   Atorvastatin  Other (See Comments)    myalgias   Codeine Nausea And Vomiting   Hydrocodone  Other (See Comments)   Oxycodone  Nausea And Vomiting   Zetia  [Ezetimibe ] Other (See Comments)    myalgias   Current Outpatient  Medications  Medication Sig Dispense Refill   Alirocumab  (PRALUENT ) 75 MG/ML SOAJ Inject 1 mL (75 mg total) into the skin every 14 (fourteen) days. 2 mL 11   aspirin EC 81 MG tablet Take 81 mg by mouth daily.     calcium  elemental as carbonate (BARIATRIC TUMS ULTRA) 400 MG chewable tablet Chew 1,000 mg by mouth daily.     cholecalciferol (VITAMIN D3) 25 MCG (1000 UNIT) tablet Take 1,000 Units by mouth daily.     Coenzyme Q10 (COQ10) 200 MG CAPS Take 1 tablet by mouth daily.     estradiol  (ESTRACE ) 0.1 MG/GM vaginal cream Apply intravaginally once daily for 2 weeks, then decrease to 1 to 3 times weekly 42.5 g 0   ferrous sulfate 325 (65 FE) MG tablet Take 325 mg by mouth 3 (three) times a week.      ibuprofen (ADVIL,MOTRIN) 200 MG tablet Take 400 mg by mouth every 6 (six) hours as needed for mild pain.     Lotilaner  (XDEMVY ) 0.25 % SOLN INSTILL 1 DROP IN LEFT EYE TWICE A DAY 5 mL 3   Misc Natural Products (PROGESTERONE  EX) Apply 5 drops topically at bedtime. Bioidentical progesterone  compounded solution     zolpidem  (AMBIEN ) 5 MG tablet TAKE 1 TABLET BY MOUTH AT BEDTIME AS NEEDED FOR SLEEP 10 tablet 0   No current facility-administered medications for this visit.   No results found.  Review of Systems:   A ROS was performed including pertinent positives and negatives as documented in the HPI.  Physical Exam :   Constitutional: NAD and appears stated age Neurological: Alert and oriented Psych: Appropriate affect and cooperative There were no vitals taken for this visit.   Comprehensive Musculoskeletal Exam:    There is diffuse, mild swelling throughout the left midfoot.  Point tenderness over the medial cuneiform and distal posterior tibial tendon insertion on the navicular.  Patient is limited by about 20 degrees of ankle plantarflexion compared to contralateral side.  Ambulating with use of a cam boot.  Dorsalis pedis 2+.  Imaging:   MRI left foot:  IMPRESSION: 1. Marrow edema  within the plantar aspect of the medial cuneiform and navicular with possible faint curvilinear low signal abnormality at the plantar medial base of the medial cuneiform, near the level of the insertion of the posterior tibial tendon, suggestive of posttraumatic contusion with possible  nondisplaced fracture of the medial cuneiform. 2. Marrow edema along the inferior half of the cuboid and mild marrow edema at the adjacent articulating calcaneus, most compatible with posttraumatic contusions. 3. Lisfranc ligament is intact.  No evidence of malalignment. 4. Tenosynovitis of the posterior tibial tendon just proximal to the insertion. Mild tenosynovitis of the anterior tibialis and extensor digitorum tendons at the hindfoot. 5. Subcutaneous edema of the dorsal and medial plantar hindfoot and midfoot.  Assessment:   63 y.o. female with continued left foot pain after an inversion injury on 8/11.  I recent MRI was ordered with particular concern for a Lisfranc injury given her continued pain, swelling, and ecchymosis distribution.  MRI results do show that the Lisfranc ligament is intact, however there is diffuse marrow edema throughout the midfoot consistent with bony contusions.  The distal posterior tibial tendon appears inflamed and she is having pain directly over this area.  Most of her pain remains medial although a lot of the residual swelling resides in the lateral midfoot.  I will continue to recommend use of the walking boot for weightbearing and ambulation.  Will place referral to Dr. Burnetta as she may be a good candidate for shockwave therapy of the PT tendon.  I will be happy to see her back for follow-up, although Dr. Burnetta could also continue to follow on her recovery in order to limit appointments.  Plan :    - Referral to Dr. Lonell Burnetta for potential shockwave therapy of the posterior tibial tendon - Follow-up in around 4 weeks with myself or Dr. Burnetta for reassessment     I  personally saw and evaluated the patient, and participated in the management and treatment plan.  Leonce Reveal, PA-C Orthopedics

## 2024-05-27 ENCOUNTER — Encounter (HOSPITAL_BASED_OUTPATIENT_CLINIC_OR_DEPARTMENT_OTHER): Payer: Self-pay

## 2024-05-29 ENCOUNTER — Other Ambulatory Visit (HOSPITAL_COMMUNITY): Payer: Self-pay

## 2024-06-03 ENCOUNTER — Other Ambulatory Visit (HOSPITAL_COMMUNITY): Payer: Self-pay

## 2024-06-03 MED ORDER — EZETIMIBE 10 MG PO TABS
10.0000 mg | ORAL_TABLET | Freq: Every day | ORAL | 3 refills | Status: AC
  Filled 2024-06-03: qty 90, 90d supply, fill #0
  Filled 2024-08-17 (×2): qty 90, 90d supply, fill #1

## 2024-06-04 ENCOUNTER — Other Ambulatory Visit: Payer: Self-pay

## 2024-06-04 ENCOUNTER — Encounter: Payer: Self-pay | Admitting: Pharmacist

## 2024-06-05 DIAGNOSIS — C099 Malignant neoplasm of tonsil, unspecified: Secondary | ICD-10-CM | POA: Diagnosis not present

## 2024-06-12 ENCOUNTER — Ambulatory Visit (INDEPENDENT_AMBULATORY_CARE_PROVIDER_SITE_OTHER): Admitting: Sports Medicine

## 2024-06-12 DIAGNOSIS — M76822 Posterior tibial tendinitis, left leg: Secondary | ICD-10-CM

## 2024-06-12 DIAGNOSIS — G8929 Other chronic pain: Secondary | ICD-10-CM | POA: Diagnosis not present

## 2024-06-12 DIAGNOSIS — S92245D Nondisplaced fracture of medial cuneiform of left foot, subsequent encounter for fracture with routine healing: Secondary | ICD-10-CM

## 2024-06-12 DIAGNOSIS — M79672 Pain in left foot: Secondary | ICD-10-CM | POA: Diagnosis not present

## 2024-06-12 NOTE — Progress Notes (Unsigned)
 Jennifer [name] - 63 y.o. female MRN [id number]  Date of birth: March 30, 1961  Office Visit Note: Visit Date: 06/12/2024 PCP: Job Lukes, PA Referred by: Emiliano Leonce CROME, PA*  Subjective: Chief Complaint  Patient presents with  . Left Foot - Pain   HPI: Jennifer [name] is a pleasant 63 y.o. female who presents today for ***  Pertinent ROS were reviewed with the patient and found to be negative unless otherwise specified above in HPI.   Assessment & Plan: Visit Diagnoses: No diagnosis found.  Plan: ***  Follow-up: No follow-ups on file.   Meds & Orders: No orders of the defined types were placed in this encounter.  No orders of the defined types were placed in this encounter.    Procedures: No procedures performed      Clinical History: No specialty comments available.  She reports that she quit smoking about 36 years ago. Her smoking use included cigarettes. She started smoking about 39 years ago. She has a 0.8 pack-year smoking history. She has never used smokeless tobacco.  Recent Labs    05/09/24 0752  HGBA1C 5.7    Objective:   Vital Signs: There were no vitals taken for this visit.  Physical Exam  Gen: Well-appearing, in no acute distress; non-toxic CV: Well-perfused. Warm.  Resp: Breathing unlabored on room air; no wheezing. Psych: Fluid speech in conversation; appropriate affect; normal thought process  Ortho Exam - ***  Imaging: No results found.  Past Medical/Family/Surgical/Social History: Medications & Allergies reviewed per EMR, new medications updated. Patient Active Problem List   Diagnosis Date Noted  . Pain in left foot 04/22/2024  . Degenerative disc disease, lumbar 11/04/2022  . Myalgia due to statin 10/28/2022  . Hyperlipidemia 10/28/2022  . Elevated coronary artery calcium  score 10/28/2022  . 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HCC) 03/14/2022  . 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 Nonallopathic lesion of sacral region 07/02/2020  . Microcytic anemia 04/21/2020  . 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 Pain of left hand 02/07/2019  . Small bowel obstruction (HCC) 05/21/2018  . 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 GERD (gastroesophageal reflux disease) 03/04/2017  . Endometriosis 09/12/1985   Past Medical History:  Diagnosis Date  . Anemia   . Bowel obstruction (HCC)   . Cancer (HCC) 12-16-2021   metastatic squamous cell carcimoma  . Fibroids   . GERD (gastroesophageal reflux disease)   . H/O small bowel obstruction   . Hyperlipidemia    no meds, diet controlled  . Restless legs syndrome (RLS)    Family History  Problem Relation Age of Onset  . Ovarian cancer Mother   . Early death Mother   . Diabetes Father   . Hyperlipidemia Father   . Heart disease Father   . Cancer - Other Maternal Grandmother        Lymphatic  . Breast cancer Paternal Grandmother   . COPD Paternal Grandfather   . Breast cancer Maternal Aunt    Past Surgical History:  Procedure Laterality Date  . ABDOMINAL HYSTERECTOMY    . DIAGNOSTIC LAPAROSCOPY     x several with lysis of adhesions  . EXPLORATORY LAPAROTOMY     had bowel obstruction  . EYE SURGERY Bilateral    Lasik  . LARYNGOSCOPY AND  ESOPHAGOSCOPY N/A 01/07/2022   Procedure: DIRECT LARYNGOSCOPY WITH BIOPSY; ESOPHAGOSCOPY; FROZEN SECTIONS;  Surgeon: Jesus Oliphant, MD;  Location: [facility] OR;  Service: ENT;  Laterality: N/A;  . ROTATOR CUFF REPAIR Right 2017  . SMALL INTESTINE SURGERY  [id number]   [facility] - Dr Janeane   Social History   Occupational History  . Occupation: CMA- Adult nurse GI    Employer: [employer]  Tobacco Use  . Smoking status: Former    Current packs/day: 0.00    Average packs/day: 0.3 packs/day for 3.0 years  (0.8 ttl pk-yrs)    Types: Cigarettes    Start date: 09/12/1984    Quit date: 09/13/1987    Years since quitting: 36.7  . Smokeless tobacco: Never  . Tobacco comments:    smoked 30 years ago in college  Vaping Use  . Vaping status: Never Used  Substance and Sexual Activity  . Alcohol use: Yes    Comment: Occasionally  . Drug use: No  . Sexual activity: Yes    Partners: Male    Birth control/protection: Surgical, None    Comment: Hysterectomy

## 2024-06-12 NOTE — Progress Notes (Unsigned)
 Patient had an inversion ankle injury in early August when walking down the stairs. She immediately had significant pain and swelling in the left foot. She has since had an x-ray, MRI, and been in a boot. She says that this past weekend her swelling improved, although she does still have pain through much of the foot and pain/stiffness in the ankle. She is out of the boot this week and feels okay with weightbearing. She is on her feet and does stairs all day at work, and otherwise lives an active lifestyle. She would like to discuss a plan today moving forward to confirm that she will continue to heal and be able to hike and continue to go to the gym in the future. She also mentions some confusion regarding the results of the MRI. She is here today for re-evaluation and shockwave therapy.  Patient was instructed in 10 minutes of therapeutic exercises for left ankle to improve strength, ROM and function according to my instructions and plan of care by a Certified Athletic Trainer during the office visit. A customized handout was provided and demonstration of proper technique shown and discussed. Patient did perform exercises and demonstrate understanding through teachback.  All questions discussed and answered.

## 2024-06-13 ENCOUNTER — Encounter: Payer: Self-pay | Admitting: Sports Medicine

## 2024-06-19 DIAGNOSIS — H01002 Unspecified blepharitis right lower eyelid: Secondary | ICD-10-CM | POA: Diagnosis not present

## 2024-06-19 DIAGNOSIS — H01005 Unspecified blepharitis left lower eyelid: Secondary | ICD-10-CM | POA: Diagnosis not present

## 2024-06-21 DIAGNOSIS — Z85828 Personal history of other malignant neoplasm of skin: Secondary | ICD-10-CM | POA: Diagnosis not present

## 2024-06-21 DIAGNOSIS — L570 Actinic keratosis: Secondary | ICD-10-CM | POA: Diagnosis not present

## 2024-06-21 DIAGNOSIS — L821 Other seborrheic keratosis: Secondary | ICD-10-CM | POA: Diagnosis not present

## 2024-06-24 ENCOUNTER — Other Ambulatory Visit: Payer: Self-pay

## 2024-06-24 ENCOUNTER — Other Ambulatory Visit (HOSPITAL_COMMUNITY): Payer: Self-pay

## 2024-06-24 ENCOUNTER — Other Ambulatory Visit: Payer: Self-pay | Admitting: *Deleted

## 2024-06-24 DIAGNOSIS — Z1211 Encounter for screening for malignant neoplasm of colon: Secondary | ICD-10-CM

## 2024-06-24 MED ORDER — NA SULFATE-K SULFATE-MG SULF 17.5-3.13-1.6 GM/177ML PO SOLN
1.0000 | Freq: Once | ORAL | 0 refills | Status: AC
  Filled 2024-06-24: qty 354, 1d supply, fill #0

## 2024-06-26 ENCOUNTER — Other Ambulatory Visit (HOSPITAL_COMMUNITY): Payer: Self-pay

## 2024-06-28 ENCOUNTER — Encounter: Payer: Self-pay | Admitting: Sports Medicine

## 2024-06-28 ENCOUNTER — Ambulatory Visit (INDEPENDENT_AMBULATORY_CARE_PROVIDER_SITE_OTHER): Admitting: Sports Medicine

## 2024-06-28 DIAGNOSIS — M76829 Posterior tibial tendinitis, unspecified leg: Secondary | ICD-10-CM

## 2024-06-28 DIAGNOSIS — M79672 Pain in left foot: Secondary | ICD-10-CM

## 2024-06-28 DIAGNOSIS — G8929 Other chronic pain: Secondary | ICD-10-CM

## 2024-06-28 DIAGNOSIS — S92245D Nondisplaced fracture of medial cuneiform of left foot, subsequent encounter for fracture with routine healing: Secondary | ICD-10-CM

## 2024-06-28 NOTE — Progress Notes (Signed)
 [name] - 63 y.o. female MRN [id number]  Date of birth: [birth year]  Office Visit Note: Visit Date: 06/28/2024 PCP: Job Lukes, PA Referred by: Job Lukes, [place]  Subjective: Chief Complaint  Patient presents with   Left Foot - Follow-up   HPI: Jennifer [name] is a pleasant 63 y.o. female who presents today for follow-up of chronic left foot pain after nondisplaced medial cuneiform fracture.  DOI: 04/21/2024 after significant plantarflexion/inversion ankle/foot injury.  At our last visit on 06/12/2024, we did give her home rehab exercises which she has been doing consistently.  She is wearing her compressive ankle/foot sleeve which has significantly helped her swelling.  Is almost back to the other side.  Her pain is certainly improving and she is walking much more normally.  She really does not have any pain on the dorsum of the foot but occasionally will have a sharp pain over the lateral heel at times that comes and goes.  She has been able to ride the stationary bike as well as park a lot for work with good supportive shoe wear.  Improved plantarflexion and dorsiflexion which she has noticed with her.  Only has mild discomfort with ABC exercises and inversion/eversion.  Pertinent ROS were reviewed with the patient and found to be negative unless otherwise specified above in HPI.   Assessment & Plan: Visit Diagnoses:  1. Chronic foot pain, left   2. Closed nondisplaced fracture of medial cuneiform of left foot with routine healing, subsequent encounter   3. Posterior tibial tendon dysfunction    Plan: Impression is improving chronic left foot pain after nondisplaced medial cuneiform fracture after the injury in August.  She has noticed marked improvement in her pain, her soft tissue swelling and range of motion as she has progressed to her home exercise regimen which she is doing consistently.  She has much improved range of motion and activation of her posterior tibial  tendon, she will continue these on a daily basis.  She has some pain over the lateral calcaneus/cuboid juncture, which I believe is more secondary to getting back into walking activity as opposed to true pathology.  We did perform shockwave to the lateral side near the peroneal tendon and posterior tibial tendon today.  We discussed activity modification getting back into progressive cycling, okay for elliptical and other gym related activity, hold on running and firmer impact on the feet until this next follow-up in 1 month.  She may call or return sooner if any issues arise.  Okay for over-the-counter anti-inflammatories only as needed.  Follow-up: Return in about 1 month (around 07/29/2024) for Left foot.   Meds & Orders: No orders of the defined types were placed in this encounter.  No orders of the defined types were placed in this encounter.    Procedures: Procedure: ECSWT Indications:  Posterior tibial tendinitis, Peroneal   Procedure Details Consent: Risks of procedure as well as the alternatives and risks of each were explained to the patient.  Verbal consent for procedure obtained. Time Out: Verified patient identification, verified procedure, site was marked, verified correct patient position. The area was cleaned with alcohol swab.     The posterior tibial tendon, peroneal tendons was targeted for Extracorporeal shockwave therapy.    Preset: Tendinitis Power Level: 80 mJ Frequency: 8-10 Hz Impulse/cycles: 1800 Head size: Regular   Patient tolerated procedure well without immediate complications.      Clinical History: No specialty comments available.  She reports that she quit  smoking about 36 years ago. Her smoking use included cigarettes. She started smoking about 39 years ago. She has a 0.8 pack-year smoking history. She has never used smokeless tobacco.  Recent Labs    05/09/24 0752  HGBA1C 5.7    Objective:    Physical Exam  Gen: Well-appearing, in no acute  distress; non-toxic CV: Well-perfused. Warm.  Resp: Breathing unlabored on room air; no wheezing. Psych: Fluid speech in conversation; appropriate affect; normal thought process  Ortho Exam - Left foot: There is very minimal soft tissue swelling of the dorsum of the foot but markedly improved from last visit.  No tenderness over the medial midfoot near the cuneiform/navicular junction.  There is TTP lateral band of the calcaneus and distal cuboid.  She has markedly improved plantarflexion and dorsiflexion with plantarflexion near equivocal to the contralateral side.  Bilateral heel raise intact with good heel inversion which is improving upon.  Posterior tibial tendon dysfunction.  Imaging: No results found.  Past Medical/Family/Surgical/Social History: Medications & Allergies reviewed per EMR, new medications updated. 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   Nonallopathic lesion of sacral region 07/02/2020   Microcytic anemia 04/21/2020   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 04/21/2020   Pain of left hand 02/07/2019   Small bowel obstruction (HCC) 05/21/2018   Popliteus tendinitis of right lower extremity 08/01/2017   GERD (gastroesophageal reflux disease) 03/04/2017   Endometriosis 09/12/1985   Past Medical History:  Diagnosis Date   Anemia    Bowel obstruction (HCC)    Cancer (HCC) 12-16-2021   metastatic squamous cell carcimoma   Fibroids    GERD (gastroesophageal reflux disease)    H/O small  bowel obstruction    Hyperlipidemia    no meds, diet controlled   Restless legs syndrome (RLS)    Family History  Problem Relation Age of Onset   Ovarian cancer Mother    Early death Mother    Diabetes Father    Hyperlipidemia Father    Heart disease Father    Cancer - Other Maternal Grandmother        Lymphatic   Breast cancer Paternal Grandmother    COPD Paternal Grandfather    Breast cancer Maternal Aunt    Past Surgical History:  Procedure Laterality Date   ABDOMINAL HYSTERECTOMY     DIAGNOSTIC LAPAROSCOPY     x several with lysis of adhesions   EXPLORATORY LAPAROTOMY     had bowel obstruction   EYE SURGERY Bilateral    Lasik   LARYNGOSCOPY AND ESOPHAGOSCOPY N/A 01/07/2022   Procedure: DIRECT LARYNGOSCOPY WITH BIOPSY; ESOPHAGOSCOPY; FROZEN SECTIONS;  Surgeon: Jesus Oliphant, MD;  Location: [facility] OR;  Service: ENT;  Laterality: N/A;   ROTATOR CUFF REPAIR Right 2017   SMALL INTESTINE SURGERY  [id number]   [facility] - Dr Janeane   Social History   Occupational History   Occupation: CMA- Adult nurse GI    Employer: [employer]  Tobacco Use   Smoking status: Former    Current packs/day: 0.00  Average packs/day: 0.3 packs/day for 3.0 years (0.8 ttl pk-yrs)    Types: Cigarettes    Start date: 09/12/1984    Quit date: 09/13/1987    Years since quitting: 36.8   Smokeless tobacco: Never   Tobacco comments:    smoked 30 years ago in college  Vaping Use   Vaping status: Never Used  Substance and Sexual Activity   Alcohol use: Yes    Comment: Occasionally   Drug use: No   Sexual activity: Yes    Partners: Male    Birth control/protection: Surgical, None    Comment: Hysterectomy

## 2024-06-28 NOTE — Progress Notes (Signed)
 Patient says that she feels she is improving and walking more normally. She is having some sharp pain in the lateral foot which is concerning her. She has gone to the gym a couple of times to use the stationary bike which has felt great. She continues to do her exercises and says that plantarflexion and dorsiflexion with the band, as well as bilateral calf raises, feel great. She is having pain with her ABCs as inversion and eversion are painful for her, so she has limited those in comparison to the other exercises that she has done.

## 2024-07-08 ENCOUNTER — Encounter: Payer: Self-pay | Admitting: Radiation Oncology

## 2024-07-08 ENCOUNTER — Other Ambulatory Visit (HOSPITAL_BASED_OUTPATIENT_CLINIC_OR_DEPARTMENT_OTHER): Payer: Self-pay

## 2024-07-08 MED ORDER — FLUZONE 0.5 ML IM SUSY
0.5000 mL | PREFILLED_SYRINGE | Freq: Once | INTRAMUSCULAR | 0 refills | Status: AC
  Filled 2024-07-08: qty 0.5, 1d supply, fill #0

## 2024-07-15 ENCOUNTER — Encounter: Payer: Self-pay | Admitting: Radiology

## 2024-07-16 ENCOUNTER — Other Ambulatory Visit (HOSPITAL_COMMUNITY): Payer: Self-pay

## 2024-07-18 ENCOUNTER — Encounter (HOSPITAL_COMMUNITY): Payer: Self-pay

## 2024-07-20 ENCOUNTER — Other Ambulatory Visit (HOSPITAL_COMMUNITY): Payer: Self-pay

## 2024-07-29 ENCOUNTER — Ambulatory Visit (INDEPENDENT_AMBULATORY_CARE_PROVIDER_SITE_OTHER): Admitting: Family Medicine

## 2024-07-29 ENCOUNTER — Encounter: Payer: Self-pay | Admitting: Family Medicine

## 2024-07-29 ENCOUNTER — Other Ambulatory Visit: Payer: Self-pay

## 2024-07-29 VITALS — BP 126/78 | HR 74 | Wt 124.8 lb

## 2024-07-29 DIAGNOSIS — N951 Menopausal and female climacteric states: Secondary | ICD-10-CM | POA: Insufficient documentation

## 2024-07-29 MED ORDER — AMBULATORY NON FORMULARY MEDICATION: 3.0000 [drp] | Freq: Every morning | 3 refills | Status: AC

## 2024-07-29 MED ORDER — AMBULATORY NON FORMULARY MEDICATION: 5.0000 [drp] | Freq: Every day | 3 refills | Status: AC

## 2024-07-29 NOTE — Assessment & Plan Note (Signed)
 Given on-going sx's, would suggest we continue these. This formulation works for her. May attempt to stop her progesterone  because it may not offer any benefit in a patient without her uterus. If sx's recur, then resume this. Discussed newer recommendations around HRT.

## 2024-07-29 NOTE — Progress Notes (Signed)
   Subjective:    Patient ID: Jennifer [name] is a 63 y.o. female presenting with New GYN  on 07/29/2024  HPI: Has h/o endometriosis, had a lot of surgery. 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 Adopted kids. Hysterectomy and TAH/BSO at age 30. Started on Vivelle  dot, tried lozenges, none of these worked. On Progesterone  and estrogen and testosterone  gtts which worked well. Has these compounded at the [facility]. Last year she tried to stop her HRT, but had rapid onset of hot flashes and then resumed these. 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 Had vaginal exam with Dr. Rutherford earlier this year who does not write for these drops.  Review of Systems  Constitutional:  Negative for chills and fever.  Respiratory:  Negative for shortness of breath.   Cardiovascular:  Negative for chest pain.  Gastrointestinal:  Negative for abdominal pain, nausea and vomiting.  Genitourinary:  Negative for dysuria.  Skin:  Negative for rash.      Objective:    BP 126/78   Pulse 74   Wt 124 lb 12.8 oz (56.6 kg)   BMI 21.42 kg/m  Physical Exam Exam conducted with a chaperone present.  Constitutional:      General: She is not in acute distress.    Appearance: She is well-developed.  HENT:     Head: Normocephalic and atraumatic.  Eyes:     General: No scleral icterus. Cardiovascular:     Rate and Rhythm: Normal rate.  Pulmonary:     Effort: Pulmonary effort is normal.  Abdominal:     Palpations: Abdomen is soft.  Musculoskeletal:     Cervical back: Neck supple.  Skin:    General: Skin is warm and dry.  Neurological:     Mental Status: She is alert and oriented to person, place, and time.         Assessment & Plan:   Problem List Items Addressed This Visit       Unprioritized   Hot flashes due to menopause - Primary   Given on-going sx's, would suggest we continue these. This formulation works for her. May attempt to stop her progesterone  because it may not offer any  benefit in a patient without her uterus. If sx's recur, then resume this. Discussed newer recommendations around HRT.      Relevant Medications   AMBULATORY NON FORMULARY MEDICATION   AMBULATORY NON FORMULARY MEDICATION     Return in about 1 year (around 07/29/2025).  Glenys GORMAN Birk, MD 07/29/2024 9:19 AM

## 2024-08-02 ENCOUNTER — Ambulatory Visit: Admitting: Sports Medicine

## 2024-08-16 ENCOUNTER — Encounter: Payer: Self-pay | Admitting: Gastroenterology

## 2024-08-17 ENCOUNTER — Other Ambulatory Visit (HOSPITAL_COMMUNITY): Payer: Self-pay

## 2024-08-26 ENCOUNTER — Ambulatory Visit: Admitting: Gastroenterology

## 2024-08-26 ENCOUNTER — Encounter: Payer: Self-pay | Admitting: Gastroenterology

## 2024-08-26 VITALS — BP 118/85 | HR 72 | Temp 97.5°F | Resp 13 | Ht 64.0 in | Wt 120.0 lb

## 2024-08-26 DIAGNOSIS — K219 Gastro-esophageal reflux disease without esophagitis: Secondary | ICD-10-CM | POA: Diagnosis not present

## 2024-08-26 DIAGNOSIS — K644 Residual hemorrhoidal skin tags: Secondary | ICD-10-CM

## 2024-08-26 DIAGNOSIS — K648 Other hemorrhoids: Secondary | ICD-10-CM | POA: Diagnosis not present

## 2024-08-26 DIAGNOSIS — G2581 Restless legs syndrome: Secondary | ICD-10-CM | POA: Diagnosis not present

## 2024-08-26 DIAGNOSIS — Z1211 Encounter for screening for malignant neoplasm of colon: Secondary | ICD-10-CM | POA: Diagnosis not present

## 2024-08-26 MED ORDER — SODIUM CHLORIDE 0.9 % IV SOLN: 500.0000 mL | Freq: Once | INTRAVENOUS | Status: DC

## 2024-08-26 NOTE — Op Note (Signed)
 [facility] Patient Name: Jennifer [name] Procedure Date: 08/26/2024 1:34 PM MRN: [id number] Endoscopist: Gustav ALONSO Mcgee , MD, [id number] Age: 63 Referring MD:  Date of Birth: [birth year] Gender: Female Account #: [id number] Procedure:                Colonoscopy Indications:              Screening for colorectal malignant neoplasm, Last                            colonoscopy: 2015 Medicines:                Monitored Anesthesia Care Procedure:                Pre-Anesthesia Assessment:                           - Prior to the procedure, a History and Physical                            was performed, and patient medications and                            allergies were reviewed. The patient's tolerance of                            previous anesthesia was also reviewed. The risks                            and benefits of the procedure and the sedation                            options and risks were discussed with the patient.                            All questions were answered, and informed consent                            was obtained. Prior Anticoagulants: The patient has                            taken no anticoagulant or antiplatelet agents. ASA                            Grade Assessment: II - A patient with mild systemic                            disease. After reviewing the risks and benefits,                            the patient was deemed in satisfactory condition to                            undergo the procedure.  After obtaining informed consent, the colonoscope                            was passed under direct vision. Throughout the                            procedure, the patient's blood pressure, pulse, and                            oxygen saturations were monitored continuously. The                            Olympus Scope [id number] was introduced through the                            anus and advanced to the the cecum,  identified by                            appendiceal orifice and ileocecal valve. The                            colonoscopy was performed without difficulty. The                            patient tolerated the procedure well. The quality                            of the bowel preparation was good. The ileocecal                            valve, appendiceal orifice, and rectum were                            photographed. Scope In: 1:48:34 PM Scope Out: 2:04:58 PM Scope Withdrawal Time: 0 hours 7 minutes 52 seconds  Total Procedure Duration: 0 hours 16 minutes 24 seconds  Findings:                 The perianal and digital rectal examinations were                            normal.                           Non-bleeding external and internal hemorrhoids were                            found during retroflexion. The hemorrhoids were                            small.                           The exam was otherwise without abnormality. Complications:            No immediate complications. Estimated Blood Loss:  Estimated blood loss: none. Impression:               - Non-bleeding external and internal hemorrhoids.                           - The examination was otherwise normal.                           - No specimens collected. Recommendation:           - Resume previous diet.                           - Continue present medications.                           - Repeat colonoscopy in 10 years for surveillance. Mackinley Kiehn V. Ludell Zacarias, MD 08/26/2024 2:12:19 PM This report has been signed electronically.

## 2024-08-26 NOTE — Progress Notes (Signed)
 Pt's states no medical or surgical changes since previsit or office visit.

## 2024-08-26 NOTE — Patient Instructions (Signed)
   Handout on hemorrhoids given to you today    Continue previous diet & medications    YOU HAD AN ENDOSCOPIC PROCEDURE TODAY AT THE [facility]:   Refer to the procedure report that was given to you for any specific questions about what was found during the examination.  If the procedure report does not answer your questions, please call your gastroenterologist to clarify.  If you requested that your care partner not be given the details of your procedure findings, then the procedure report has been included in a sealed envelope for you to review at your convenience later.  YOU SHOULD EXPECT: Some feelings of bloating in the abdomen. Passage of more gas than usual.  Walking can help get rid of the air that was put into your GI tract during the procedure and reduce the bloating. If you had a lower endoscopy (such as a colonoscopy or flexible sigmoidoscopy) you may notice spotting of blood in your stool or on the toilet paper. If you underwent a bowel prep for your procedure, you may not have a normal bowel movement for a few days.  Please Note:  You might notice some irritation and congestion in your nose or some drainage.  This is from the oxygen used during your procedure.  There is no need for concern and it should clear up in a day or so.  SYMPTOMS TO REPORT IMMEDIATELY:  Following lower endoscopy (colonoscopy or flexible sigmoidoscopy):  Excessive amounts of blood in the stool  Significant tenderness or worsening of abdominal pains  Swelling of the abdomen that is new, acute  Fever of 100F or higher   For urgent or emergent issues, a gastroenterologist can be reached at any hour by calling [phone]. Do not use MyChart messaging for urgent concerns.    DIET:  We do recommend a small meal at first, but then you may proceed to your regular diet.  Drink plenty of fluids but you should avoid alcoholic beverages for 24 hours.  ACTIVITY:  You should plan to take it  easy for the rest of today and you should NOT DRIVE or use heavy machinery until tomorrow (because of the sedation medicines used during the test).    FOLLOW UP: Our staff will call the number listed on your records the next business day following your procedure.  We will call around 7:15- 8:00 am to check on you and address any questions or concerns that you may have regarding the information given to you following your procedure. If we do not reach you, we will leave a message.     If any biopsies were taken you will be contacted by phone or by letter within the next 1-3 weeks.  Please call us at [phone] if you have not heard about the biopsies in 3 weeks.    SIGNATURES/CONFIDENTIALITY: You and/or your care partner have signed paperwork which will be entered into your electronic medical record.  These signatures attest to the fact that that the information above on your After Visit Summary has been reviewed and is understood.  Full responsibility of the confidentiality of this discharge information lies with you and/or your care-partner.

## 2024-08-26 NOTE — Progress Notes (Signed)
 Report given to PACU, vss

## 2024-08-26 NOTE — Progress Notes (Signed)
 [facility] History and Physical   Primary Care Physician:  Job Lukes, [place]   Reason for Procedure:  Colorectal cancer screening  Plan:    Screening colonoscopy with possible interventions as needed     HPI: Jennifer [name] is a very pleasant 63 y.o. female here for screening colonoscopy. Denies any nausea, vomiting, abdominal pain, melena or bright red blood per rectum  The risks and benefits as well as alternatives of endoscopic procedure(s) have been discussed and reviewed.  The patient was provided an opportunity to ask questions and all were answered. The patient agreed with the plan and demonstrated an understanding of the instructions.   Past Medical History:  Diagnosis Date   Anemia    Bowel obstruction (HCC)    Cancer (HCC) 12-16-2021   metastatic squamous cell carcimoma   Fibroids    GERD (gastroesophageal reflux disease)    H/O small bowel obstruction    Hyperlipidemia    no meds, diet controlled   Restless legs syndrome (RLS)     Past Surgical History:  Procedure Laterality Date   ABDOMINAL HYSTERECTOMY     DIAGNOSTIC LAPAROSCOPY     x several with lysis of adhesions   EXPLORATORY LAPAROTOMY     had bowel obstruction   EYE SURGERY Bilateral    Lasik   LARYNGOSCOPY AND ESOPHAGOSCOPY N/A 01/07/2022   Procedure: DIRECT LARYNGOSCOPY WITH BIOPSY; ESOPHAGOSCOPY; FROZEN SECTIONS;  Surgeon: Jesus Oliphant, MD;  Location: [facility] OR;  Service: ENT;  Laterality: N/A;   ROTATOR CUFF REPAIR Right 2017   SMALL INTESTINE SURGERY  [id number]   [facility] - Dr Janeane    Prior to Admission medications  Medication Sig Start Date End Date Taking? Authorizing Provider  Alirocumab  (PRALUENT ) 75 MG/ML SOAJ Inject 1 mL (75 mg total) into the skin every 14 (fourteen) days. 03/06/24  Yes Pavero, Lonni, RPH  AMBULATORY NON FORMULARY MEDICATION Place 5 drops onto the skin at bedtime. Medication Name: Progesterone  Topical 20% (200mg /ml) 07/29/24  Yes Fredirick Glenys RAMAN,  MD  AMBULATORY NON FORMULARY MEDICATION Place 3 drops onto the skin in the morning. Medication Name: Estriol/Estradiol -Testosterone  Topical 5 (2.5-2.5 mg/ml) - 4 mg/ml solution 07/29/24  Yes Fredirick Glenys RAMAN, MD  aspirin EC 81 MG tablet Take 81 mg by mouth daily.   Yes [provider]  cholecalciferol (VITAMIN D3) 25 MCG (1000 UNIT) tablet Take 1,000 Units by mouth daily.   Yes [provider]  Coenzyme Q10 (COQ10) 200 MG CAPS Take 1 tablet by mouth daily. 01/15/24  Yes [provider]  ezetimibe  (ZETIA ) 10 MG tablet Take 1 tablet (10 mg total) by mouth daily. 06/03/24 11/30/24 Yes Tobb, Kardie, DO  calcium  elemental as carbonate (BARIATRIC TUMS ULTRA) 400 MG chewable tablet Chew 1,000 mg by mouth daily.    [provider]  estradiol  (ESTRACE ) 0.1 MG/GM vaginal cream Apply intravaginally once daily for 2 weeks, then decrease to 1 to 3 times weekly 11/17/22   Job Lukes, PA  ferrous sulfate 325 (65 FE) MG tablet Take 325 mg by mouth 3 (three) times a week.     [provider]  ibuprofen (ADVIL,MOTRIN) 200 MG tablet Take 400 mg by mouth every 6 (six) hours as needed for mild pain.    [provider]  zolpidem  (AMBIEN ) 5 MG tablet TAKE 1 TABLET BY MOUTH AT BEDTIME AS NEEDED FOR SLEEP 02/17/23 07/29/24  Job Lukes, PA    Current Outpatient Medications  Medication Sig Dispense Refill   Alirocumab  (PRALUENT ) 75  MG/ML SOAJ Inject 1 mL (75 mg total) into the skin every 14 (fourteen) days. 2 mL 11   AMBULATORY NON FORMULARY MEDICATION Place 5 drops onto the skin at bedtime. Medication Name: Progesterone  Topical 20% (200mg /ml) 90 mL 3   AMBULATORY NON FORMULARY MEDICATION Place 3 drops onto the skin in the morning. Medication Name: Estriol/Estradiol -Testosterone  Topical 5 (2.5-2.5 mg/ml) - 4 mg/ml solution 90 mL 3   aspirin EC 81 MG tablet Take 81 mg by mouth daily.     cholecalciferol (VITAMIN D3) 25 MCG (1000 UNIT) tablet Take 1,000 Units by mouth  daily.     Coenzyme Q10 (COQ10) 200 MG CAPS Take 1 tablet by mouth daily.     ezetimibe  (ZETIA ) 10 MG tablet Take 1 tablet (10 mg total) by mouth daily. 90 tablet 3   calcium  elemental as carbonate (BARIATRIC TUMS ULTRA) 400 MG chewable tablet Chew 1,000 mg by mouth daily.     estradiol  (ESTRACE ) 0.1 MG/GM vaginal cream Apply intravaginally once daily for 2 weeks, then decrease to 1 to 3 times weekly 42.5 g 0   ferrous sulfate 325 (65 FE) MG tablet Take 325 mg by mouth 3 (three) times a week.      ibuprofen (ADVIL,MOTRIN) 200 MG tablet Take 400 mg by mouth every 6 (six) hours as needed for mild pain.     zolpidem  (AMBIEN ) 5 MG tablet TAKE 1 TABLET BY MOUTH AT BEDTIME AS NEEDED FOR SLEEP 10 tablet 0   Current Facility-Administered Medications  Medication Dose Route Frequency Provider Last Rate Last Admin   0.9 %  sodium chloride  infusion  500 mL Intravenous Once Aleda Madl V, MD        Allergies as of 08/26/2024 - Review Complete 07/29/2024  Allergen Reaction Noted   Atorvastatin  Other (See Comments) 10/28/2022   Codeine Nausea And Vomiting    Hydrocodone  Other (See Comments) 02/21/2023   Oxycodone  Nausea And Vomiting 06/30/2022    Family History  Problem Relation Age of Onset   Ovarian cancer Mother    Early death Mother    Diabetes Father    Hyperlipidemia Father    Heart disease Father    Cancer - Other Maternal Grandmother        Lymphatic   Breast cancer Paternal Grandmother    COPD Paternal Grandfather    Breast cancer Maternal Aunt     Social History   Socioeconomic History   Marital status: Married    Spouse name: Not on file   Number of children: 3   Years of education: Not on file   Highest education level: Not on file  Occupational History   Occupation: CMA- Adult Nurse GI    Employer: [employer]  Tobacco Use   Smoking status: Former    Current packs/day: 0.00    Average packs/day: 0.3 packs/day for 3.0 years (0.8 ttl pk-yrs)    Types: Cigarettes     Start date: 09/12/1984    Quit date: 09/13/1987    Years since quitting: 36.9   Smokeless tobacco: Never   Tobacco comments:    smoked 30 years ago in college  Vaping Use   Vaping status: Never Used  Substance and Sexual Activity   Alcohol use: Yes    Comment: Occasionally   Drug use: No   Sexual activity: Yes    Partners: Male    Birth control/protection: Surgical, None    Comment: Hysterectomy  Other Topics Concern   Not on file  Social History Narrative  CMA with [employer]   Social Drivers of Health   Tobacco Use: Medium Risk (07/29/2024)   Patient History    Smoking Tobacco Use: Former    Smokeless Tobacco Use: Never    Passive Exposure: Not on Actuary Strain: Not on file  Food Insecurity: No Food Insecurity (07/29/2024)   Epic    Worried About Programme Researcher, Broadcasting/film/video in the Last Year: Never true    Ran Out of Food in the Last Year: Never true  Transportation Needs: No Transportation Needs (07/29/2024)   Epic    Lack of Transportation (Medical): No    Lack of Transportation (Non-Medical): No  Physical Activity: Not on file  Stress: Not on file  Social Connections: Not on file  Intimate Partner Violence: Not on file  Depression (PHQ2-9): Low Risk (07/29/2024)   Depression (PHQ2-9)    PHQ-2 Score: 0  Alcohol Screen: Not on file  Housing: Low Risk (01/20/2022)   Housing    Last Housing Risk Score: 0  Utilities: Not on file  Health Literacy: Not on file    Review of Systems:  All other review of systems negative except as mentioned in the HPI.  Physical Exam: Vital signs in last 24 hours: BP 113/71   Pulse (!) 102   Temp (!) 97.5 F (36.4 C)   Ht 5' 4 (1.626 m)   Wt 120 lb (54.4 kg)   SpO2 99%   BMI 20.60 kg/m  General:   Alert, NAD Lungs:  Clear .   Heart:  Regular rate and rhythm Abdomen:  Soft, nontender and nondistended. Neuro/Psych:  Alert and cooperative. Normal mood and affect. A and O x 3  Reviewed labs, radiology imaging,  old records and pertinent past GI work up  Patient is appropriate for planned procedure(s) and anesthesia in an ambulatory setting   Jennifer [name] , MD [phone]

## 2024-08-27 ENCOUNTER — Telehealth: Payer: Self-pay

## 2024-08-27 NOTE — Telephone Encounter (Signed)
°  Follow up Call-     08/26/2024    1:18 PM  Call back number  Post procedure Call Back phone  # [phone]  Permission to leave phone message Yes     Patient questions:  Do you have a fever, pain , or abdominal swelling? No. Pain Score  0 *  Have you tolerated food without any problems? Yes.    Have you been able to return to your normal activities? Yes.    Do you have any questions about your discharge instructions: Diet   No. Medications  No. Follow up visit  No.  Do you have questions or concerns about your Care? No.  Actions: * If pain score is 4 or above: No action needed, pain <4.

## 2024-08-31 ENCOUNTER — Other Ambulatory Visit (HOSPITAL_COMMUNITY): Payer: Self-pay

## 2024-09-05 ENCOUNTER — Encounter: Payer: Self-pay | Admitting: Physician Assistant

## 2024-09-06 ENCOUNTER — Telehealth (INDEPENDENT_AMBULATORY_CARE_PROVIDER_SITE_OTHER): Admitting: Family Medicine

## 2024-09-06 ENCOUNTER — Ambulatory Visit: Payer: Self-pay | Admitting: *Deleted

## 2024-09-06 ENCOUNTER — Encounter: Payer: Self-pay | Admitting: Family Medicine

## 2024-09-06 VITALS — BP 120/80 | Temp 98.6°F | Wt 120.0 lb

## 2024-09-06 DIAGNOSIS — J101 Influenza due to other identified influenza virus with other respiratory manifestations: Secondary | ICD-10-CM

## 2024-09-06 MED ORDER — OSELTAMIVIR PHOSPHATE 75 MG PO CAPS: 75.0000 mg | ORAL_CAPSULE | Freq: Two times a day (BID) | ORAL | 0 refills | Status: AC

## 2024-09-06 NOTE — Telephone Encounter (Signed)
Please advse 

## 2024-09-06 NOTE — Telephone Encounter (Signed)
 Noted

## 2024-09-06 NOTE — Telephone Encounter (Signed)
 FYI Only or Action Required?: FYI only for provider: appointment scheduled on 09/06/24 VV.  Patient was last seen in primary care on 05/07/2024 by Job Lukes, PA.  Called Nurse Triage reporting Influenza.  Symptoms began several days ago. Wednesday   Interventions attempted: OTC medications: tylenol  , ibuprofen, cold / flu .  Symptoms are: gradually worsening.  Triage Disposition: Home Care requesting medication   Patient/caregiver understands and will follow disposition?: Yes                 Copied from CRM #[id number]. Topic: Clinical - Red Word Triage >> Sep 06, 2024 12:15 PM Rea ORN wrote: Red Word that prompted transfer to Nurse Triage: Pt tested postive Flu yesterday. Pt has bad cough that is hurting chest Reason for Disposition  Mild central chest pain that only occurs when coughing is also present  Answer Assessment - Initial Assessment Questions Tested positive for influenza A at home test yesterday . Symptoms started Wednesday. No available appt with PCP or other providers today. Scheduled VV today same day acute/ [facility]. See My chart message. No difficulty breathing. No fever at this time .        1. SYMPTOMS: What is your main symptom or concern? (e.g., cough, fever, shortness of breath, muscle aches)     Cough, chest pain with coughing , congestion, headache  2. ONSET: When did the symptoms start?      Wednesday  3. COUGH: Do you have a cough? If Yes, ask: How bad is the cough?       Yes cough all night 4. FEVER: Do you have a fever? If Yes, ask: What is your temperature, how was it measured, and when did it start?     98.6 now has been  5. BREATHING DIFFICULTY: Are you having any difficulty breathing? (e.g., normal; shortness of breath, wheezing, unable to speak)      Normal  6. BETTER-SAME-WORSE: Are you getting better, staying the same or getting worse compared to yesterday?  If getting worse, ask, In what way?      Worsening  7. OTHER SYMPTOMS: Do you have any other symptoms?  (e.g., chills, fatigue, headache, loss of smell or taste, muscle pain, sore throat)     Headache, cough chest soreness from cough, fever at times. Feels miserable  8. INFLUENZA EXPOSURE: Was there any known exposure to influenza (flu) before the symptoms began?      Na / patient works for [employer] 9. INFLUENZA SUSPECTED: Why do you think you have influenza? (e.g., positive flu self-test at home, symptoms after exposure).     Yes self test at home positive for  influenza A 10. INFLUENZA VACCINE: Have you had the flu vaccine? If Yes, ask: When did you last get it?       yes 11. HIGH RISK FOR COMPLICATIONS: Do you have any chronic medical problems? (e.g., asthma, heart or lung disease, obesity, weak immune system)       No  12. PREGNANCY: Is there any chance you are pregnant? When was your last menstrual period?       na 13. O2 SATURATION MONITOR:  Do you use an oxygen saturation monitor (pulse oximeter) at home? If Yes, ask What is your reading (oxygen level) today? What is your usual oxygen saturation reading? (e.g., 95%)       na  Protocols used: Influenza (Flu) Suspected-A-AH

## 2024-09-06 NOTE — Progress Notes (Signed)
 "  Virtual Medical Office Visit  Patient:  Jennifer [name]      Age: 63 y.o.       Sex:  female  Date:   09/06/2024  PCP:    Job Lukes, PA   Today's Healthcare Provider: Heron CHRISTELLA Sharper, MD    Assessment/Plan:   Summary assessment:  Jennifer [name] was seen today for influenza, cough, chills, dizziness, loss of consciousness, headache and sore throat.  Influenza A -     Oseltamivir  Phosphate; Take 1 capsule (75 mg total) by mouth 2 (two) times daily.  Dispense: 10 capsule; Refill: 0   Assessment and Plan    Influenza A Confirmed influenza A infection with positive home test. Symptoms began on Wednesday, within the 48-hour window for Tamiflu  treatment. Tamiflu  is recommended to reduce symptom severity and duration. Common side effect is nausea, occurring in 8-10% of patients. Tamiflu  is well tolerated and does not interact with other medications. Contagious period is 5-7 days, with the first 3 days being the most contagious. - Prescribed Tamiflu , one capsule twice daily for five days. - Advised taking Tamiflu  with food to minimize nausea. - Continue over-the-counter medications such as NyQuil and Aurovela. - Use ibuprofen for fever, up to 800 mg every 6-8 hours, and alternate with Tylenol  if needed. - Consider prescription cough suppressants with dextromethorphan (DM) for cough relief.        No follow-ups on file.   She was advised to call the office or go to ER if her condition worsens    Subjective:   Jennifer [name] is a 63 y.o. female with PMH significant for: Past Medical History:  Diagnosis Date   Anemia    Bowel obstruction (HCC)    Cancer (HCC) 12-16-2021   metastatic squamous cell carcimoma   Fibroids    GERD (gastroesophageal reflux disease)    H/O small bowel obstruction    Hyperlipidemia    no meds, diet controlled   Restless legs syndrome (RLS)      Presenting today with: Chief Complaint  Patient presents with   Influenza    Patient states the home test  was positive for Influenza on yesterday   Cough    Non-productive x2 days, with chest pain and worse since last night   Chills    X2 days   Dizziness   Loss of Consciousness    States she passed out twice 2 days ago, hit her head and left buttock  and did not seek ER treatment and states her husband thought she was having a seizure   Headache    X2 days, taking Tylenol  and Ibuprofen with relief   Sore Throat     She clarifies and reports that her condition: Discussed the use of AI scribe software for clinical note transcription with the patient, who gave verbal consent to proceed.  History of Present Illness   Jennifer [name] is a 63 year old female who presents with flu symptoms following a positive flu test at home.  She has had fever, chills, body aches, and fatigue since Wednesday. She has used NyQuil and ibuprofen with partial relief. She is worried about how long she remains contagious to family and friends.       She denies having any: Chest pain, difficulty breathing          Objective/Observations  Physical Exam:  Polite and friendly Gen: NAD, resting comfortably Pulm: Normal work of breathing Neuro: Grossly normal, moves all extremities Psych: Normal  affect and thought content Problem specific physical exam findings:    No images are attached to the encounter or orders placed in the encounter.    Results: No results found for any visits on 09/06/24.   No results found for this or any previous visit (from the past 2160 hours).         Virtual Visit via Video   I connected with Jennifer [name] on 09/06/2024 at  3:00 PM EST by a video enabled telemedicine application and verified that I am speaking with the correct person using two identifiers. The limitations of evaluation and management by telemedicine and the availability of in person appointments were discussed. The patient expressed understanding and agreed to proceed.   Percentage of appointment  time on video:  100% Patient location: Home Provider location: [facility] Persons participating in the virtual visit: Myself and Patient     "

## 2024-12-17 ENCOUNTER — Ambulatory Visit: Admitting: Radiology

## 2025-05-09 ENCOUNTER — Encounter: Admitting: Physician Assistant
